# Patient Record
Sex: Male | Born: 1955 | Race: Black or African American | Hispanic: No | Marital: Single | Smoking: Current every day smoker
Health system: Southern US, Community
[De-identification: ages and names within clinical notes are randomized; demographics above are authoritative.]

## PROBLEM LIST (undated history)

## (undated) DIAGNOSIS — F1910 Other psychoactive substance abuse, uncomplicated: Secondary | ICD-10-CM

## (undated) DIAGNOSIS — F32A Depression, unspecified: Secondary | ICD-10-CM

## (undated) DIAGNOSIS — B1920 Unspecified viral hepatitis C without hepatic coma: Secondary | ICD-10-CM

## (undated) DIAGNOSIS — F319 Bipolar disorder, unspecified: Secondary | ICD-10-CM

## (undated) DIAGNOSIS — H5440 Blindness, one eye, unspecified eye: Secondary | ICD-10-CM

## (undated) DIAGNOSIS — K7460 Unspecified cirrhosis of liver: Secondary | ICD-10-CM

## (undated) DIAGNOSIS — F329 Major depressive disorder, single episode, unspecified: Secondary | ICD-10-CM

## (undated) DIAGNOSIS — D696 Thrombocytopenia, unspecified: Secondary | ICD-10-CM

## (undated) HISTORY — PX: EYE SURGERY: SHX253

---

## 2004-10-31 ENCOUNTER — Emergency Department (HOSPITAL_COMMUNITY): Admission: EM | Admit: 2004-10-31 | Discharge: 2004-11-01 | Payer: Self-pay | Admitting: Emergency Medicine

## 2004-11-07 ENCOUNTER — Ambulatory Visit: Payer: Self-pay | Admitting: *Deleted

## 2005-04-21 ENCOUNTER — Emergency Department (HOSPITAL_COMMUNITY): Admission: EM | Admit: 2005-04-21 | Discharge: 2005-04-21 | Payer: Self-pay | Admitting: Emergency Medicine

## 2005-04-22 ENCOUNTER — Emergency Department (HOSPITAL_COMMUNITY): Admission: EM | Admit: 2005-04-22 | Discharge: 2005-04-22 | Payer: Self-pay | Admitting: Emergency Medicine

## 2008-03-11 ENCOUNTER — Emergency Department (HOSPITAL_COMMUNITY): Admission: EM | Admit: 2008-03-11 | Discharge: 2008-03-11 | Payer: Self-pay | Admitting: Emergency Medicine

## 2009-12-17 ENCOUNTER — Emergency Department (HOSPITAL_COMMUNITY)
Admission: EM | Admit: 2009-12-17 | Discharge: 2009-12-17 | Payer: Self-pay | Source: Home / Self Care | Admitting: Emergency Medicine

## 2009-12-24 ENCOUNTER — Emergency Department (HOSPITAL_COMMUNITY): Admission: EM | Admit: 2009-12-24 | Discharge: 2009-12-25 | Payer: Self-pay | Admitting: Emergency Medicine

## 2010-04-11 LAB — POCT I-STAT, CHEM 8
BUN: 12 mg/dL (ref 6–23)
Calcium, Ion: 0.98 mmol/L — ABNORMAL LOW (ref 1.12–1.32)
Chloride: 103 meq/L (ref 96–112)
Creatinine, Ser: 1.2 mg/dL (ref 0.4–1.5)
Glucose, Bld: 105 mg/dL — ABNORMAL HIGH (ref 70–99)
HCT: 39 % (ref 39.0–52.0)
Hemoglobin: 13.3 g/dL (ref 13.0–17.0)
Potassium: 3.8 meq/L (ref 3.5–5.1)
Sodium: 141 meq/L (ref 135–145)
TCO2: 28 mmol/L (ref 0–100)

## 2010-04-11 LAB — DIFFERENTIAL
Basophils Absolute: 0 K/uL (ref 0.0–0.1)
Basophils Relative: 1 % (ref 0–1)
Basophils Relative: 1 % (ref 0–1)
Eosinophils Absolute: 0 10*3/uL (ref 0.0–0.7)
Eosinophils Absolute: 0 K/uL (ref 0.0–0.7)
Eosinophils Relative: 0 % (ref 0–5)
Lymphocytes Relative: 63 % — ABNORMAL HIGH (ref 12–46)
Lymphs Abs: 1.3 10*3/uL (ref 0.7–4.0)
Lymphs Abs: 2.8 K/uL (ref 0.7–4.0)
Monocytes Absolute: 0.3 K/uL (ref 0.1–1.0)
Monocytes Relative: 5 % (ref 3–12)
Monocytes Relative: 6 % (ref 3–12)
Neutro Abs: 1.4 K/uL — ABNORMAL LOW (ref 1.7–7.7)
Neutro Abs: 2.4 10*3/uL (ref 1.7–7.7)
Neutrophils Relative %: 30 % — ABNORMAL LOW (ref 43–77)
Neutrophils Relative %: 61 % (ref 43–77)

## 2010-04-11 LAB — CBC
HCT: 34.5 % — ABNORMAL LOW (ref 39.0–52.0)
HCT: 39.5 % (ref 39.0–52.0)
Hemoglobin: 11.5 g/dL — ABNORMAL LOW (ref 13.0–17.0)
Hemoglobin: 13.8 g/dL (ref 13.0–17.0)
MCH: 30.2 pg (ref 26.0–34.0)
MCH: 31.5 pg (ref 26.0–34.0)
MCHC: 33.3 g/dL (ref 30.0–36.0)
MCHC: 34.8 g/dL (ref 30.0–36.0)
MCV: 90.5 fL (ref 78.0–100.0)
MCV: 90.6 fL (ref 78.0–100.0)
Platelets: 65 K/uL — ABNORMAL LOW (ref 150–400)
Platelets: 73 K/uL — ABNORMAL LOW (ref 150–400)
RBC: 3.81 MIL/uL — ABNORMAL LOW (ref 4.22–5.81)
RBC: 4.37 MIL/uL (ref 4.22–5.81)
RDW: 13.4 % (ref 11.5–15.5)
RDW: 13.5 % (ref 11.5–15.5)
WBC: 3.9 K/uL — ABNORMAL LOW (ref 4.0–10.5)
WBC: 4.5 K/uL (ref 4.0–10.5)

## 2010-04-11 LAB — CK TOTAL AND CKMB (NOT AT ARMC)
CK, MB: 3.9 ng/mL (ref 0.3–4.0)
Total CK: 278 U/L — ABNORMAL HIGH (ref 7–232)

## 2010-04-11 LAB — URINALYSIS, ROUTINE W REFLEX MICROSCOPIC
Bilirubin Urine: NEGATIVE
Glucose, UA: NEGATIVE mg/dL
Hgb urine dipstick: NEGATIVE
Ketones, ur: NEGATIVE mg/dL
Nitrite: NEGATIVE
Nitrite: NEGATIVE
Protein, ur: NEGATIVE mg/dL
Specific Gravity, Urine: 1.015 (ref 1.005–1.030)
Specific Gravity, Urine: 1.02 (ref 1.005–1.030)
Urobilinogen, UA: 1 mg/dL (ref 0.0–1.0)
Urobilinogen, UA: 1 mg/dL (ref 0.0–1.0)
pH: 5 (ref 5.0–8.0)
pH: 5.5 (ref 5.0–8.0)

## 2010-04-11 LAB — COMPREHENSIVE METABOLIC PANEL WITH GFR
ALT: 125 U/L — ABNORMAL HIGH (ref 0–53)
AST: 222 U/L — ABNORMAL HIGH (ref 0–37)
Albumin: 3.4 g/dL — ABNORMAL LOW (ref 3.5–5.2)
Alkaline Phosphatase: 118 U/L — ABNORMAL HIGH (ref 39–117)
BUN: 15 mg/dL (ref 6–23)
CO2: 27 meq/L (ref 19–32)
Calcium: 8.6 mg/dL (ref 8.4–10.5)
Chloride: 104 meq/L (ref 96–112)
Creatinine, Ser: 0.95 mg/dL (ref 0.4–1.5)
GFR calc Af Amer: >60 mL/min (ref >60)
GFR calc non Af Amer: >60 mL/min (ref >60)
Glucose, Bld: 112 mg/dL — ABNORMAL HIGH (ref 70–99)
Potassium: 3.9 meq/L (ref 3.5–5.1)
Sodium: 143 meq/L (ref 135–145)
Total Bilirubin: 1.2 mg/dL (ref 0.3–1.2)
Total Protein: 7.4 g/dL (ref 6.0–8.3)

## 2010-04-11 LAB — ETHANOL: Alcohol, Ethyl (B): 317 mg/dL — ABNORMAL HIGH (ref 0–10)

## 2010-04-11 LAB — TROPONIN I: Troponin I: 0.03 ng/mL (ref 0.00–0.06)

## 2012-08-06 ENCOUNTER — Encounter (HOSPITAL_COMMUNITY): Payer: Self-pay | Admitting: Emergency Medicine

## 2012-08-06 ENCOUNTER — Emergency Department (HOSPITAL_COMMUNITY)
Admission: EM | Admit: 2012-08-06 | Discharge: 2012-08-06 | Disposition: A | Discharge status: Home / Self Care | Payer: Self-pay | Attending: Emergency Medicine | Admitting: Emergency Medicine

## 2012-08-06 DIAGNOSIS — F17200 Nicotine dependence, unspecified, uncomplicated: Secondary | ICD-10-CM | POA: Insufficient documentation

## 2012-08-06 DIAGNOSIS — L259 Unspecified contact dermatitis, unspecified cause: Secondary | ICD-10-CM | POA: Insufficient documentation

## 2012-08-06 DIAGNOSIS — Z8669 Personal history of other diseases of the nervous system and sense organs: Secondary | ICD-10-CM | POA: Insufficient documentation

## 2012-08-06 HISTORY — DX: Blindness, one eye, unspecified eye: H54.40

## 2012-08-06 MED ORDER — PREDNISONE 20 MG PO TABS
60.0000 mg | ORAL_TABLET | Freq: Once | ORAL | Status: AC
  Administered 2012-08-06: 60 mg via ORAL
  Filled 2012-08-06: qty 3

## 2012-08-06 MED ORDER — PREDNISONE 10 MG PO TABS: ORAL_TABLET | ORAL | Status: DC

## 2012-08-06 MED ORDER — DIPHENHYDRAMINE HCL 25 MG PO CAPS: 25.0000 mg | ORAL_CAPSULE | Freq: Four times a day (QID) | ORAL | Status: DC | PRN

## 2012-08-06 MED ORDER — HYDROCORTISONE 1 % EX CREA: TOPICAL_CREAM | CUTANEOUS | Status: DC

## 2012-08-06 MED ORDER — DIPHENHYDRAMINE HCL 25 MG PO CAPS
25.0000 mg | ORAL_CAPSULE | Freq: Once | ORAL | Status: AC
  Administered 2012-08-06: 25 mg via ORAL
  Filled 2012-08-06: qty 1

## 2012-08-06 NOTE — ED Notes (Signed)
Started 3-4 days ago, woke up with itching bumps. Now has spread to groin, buttocks, and legs.

## 2012-08-06 NOTE — ED Provider Notes (Signed)
   History    CSN: [id number] Arrival date & time 08/06/12  1032  First MD Initiated Contact with Patient 08/06/12 1038     Chief Complaint  Patient presents with  . Rash   (Consider location/radiation/quality/duration/timing/severity/associated sxs/prior Treatment) HPI Albert Salazar is a 57 y.o. male who presents to ED with complaint of a rash. States noticed mild rash 5 days ago. States since then worsening. Reports red bumps on legs, thighs, arms. States very itchy, non tender, red. Has not tried any treatments for this. No new soap, detergent, personal products. Has not traveled or slept anywhere other than his house. States lives with a girlfriend who does not have a rash. States has done yard work in the last week.    Past Medical History  Diagnosis Date  . Blindness of left eye    Past Surgical History  Procedure Laterality Date  . Eye surgery     No family history on file. History  Substance Use Topics  . Smoking status: Current Every Day Smoker  . Smokeless tobacco: Not on file  . Alcohol Use: Yes    Review of Systems  Constitutional: Negative for fever and chills.  HENT: Negative for neck pain and neck stiffness.   Eyes: Negative for photophobia.  Skin: Positive for rash.  Neurological: Negative for headaches.    Allergies  Review of patient's allergies indicates no known allergies.  Home Medications  No current outpatient prescriptions on file. BP 154/95  Pulse 80  Temp(Src) 98.2 F (36.8 C) (Oral)  SpO2 99% Physical Exam  Nursing note and vitals reviewed. Constitutional: He appears well-developed and well-nourished. No distress.  HENT:  Head: Normocephalic and atraumatic.  Mouth/Throat: Oropharynx is clear and moist.  No mucosal rash or lesions  Neck: Neck supple.  Cardiovascular: Normal rate, regular rhythm and normal heart sounds.   Pulmonary/Chest: Effort normal. No respiratory distress. He has no wheezes. He has no rales.  Musculoskeletal: He  exhibits no edema.  Neurological: He is alert.  Skin: Skin is warm and dry.  Erythematous papules over lower legs, bilateral inner thighs, bilateral forearms. No vesicles. Non tender.     ED Course  Procedures (including critical care time) Labs Reviewed - No data to display No results found.  1. Contact dermatitis     MDM  Pt with erythematous rash to the bilateral legs, thighs, arms. Rash is consistent with contact dermatitis, possibly form his yard work. No oral mucosal involvement. No fever.  Pt otherwise non toxic. Will start on short course of prednisone, benadryl  PO. Follow up as needed.   Filed Vitals:   08/06/12 1044  BP: 154/95  Pulse: 80  Temp: 98.2 F (36.8 C)  TempSrc: Oral  SpO2: 99%     Sloane Junkin A Taryn Nave, PA-C 08/06/12 1102

## 2012-08-07 NOTE — ED Provider Notes (Signed)
Medical screening examination/treatment/procedure(s) were performed by non-physician practitioner and as supervising physician I was immediately available for consultation/collaboration.   Carleene Cooper III, MD 08/07/12 [phone]

## 2012-12-08 ENCOUNTER — Encounter (HOSPITAL_COMMUNITY): Payer: Self-pay | Admitting: Emergency Medicine

## 2012-12-08 ENCOUNTER — Emergency Department (HOSPITAL_COMMUNITY)
Admission: EM | Admit: 2012-12-08 | Discharge: 2012-12-08 | Disposition: A | Discharge status: Home / Self Care | Payer: Medicaid Other | Attending: Emergency Medicine | Admitting: Emergency Medicine

## 2012-12-08 DIAGNOSIS — R300 Dysuria: Secondary | ICD-10-CM | POA: Insufficient documentation

## 2012-12-08 DIAGNOSIS — IMO0002 Reserved for concepts with insufficient information to code with codable children: Secondary | ICD-10-CM | POA: Insufficient documentation

## 2012-12-08 DIAGNOSIS — H5440 Blindness, one eye, unspecified eye: Secondary | ICD-10-CM | POA: Insufficient documentation

## 2012-12-08 DIAGNOSIS — F17200 Nicotine dependence, unspecified, uncomplicated: Secondary | ICD-10-CM | POA: Insufficient documentation

## 2012-12-08 LAB — URINALYSIS, ROUTINE W REFLEX MICROSCOPIC
Leukocytes, UA: NEGATIVE
Nitrite: NEGATIVE
Protein, ur: NEGATIVE mg/dL
Specific Gravity, Urine: 1.019 (ref 1.005–1.030)
Urobilinogen, UA: 1 mg/dL (ref 0.0–1.0)

## 2012-12-08 LAB — POCT I-STAT, CHEM 8
Calcium, Ion: 1.22 mmol/L (ref 1.12–1.23)
Creatinine, Ser: 0.9 mg/dL (ref 0.50–1.35)
Glucose, Bld: 107 mg/dL — ABNORMAL HIGH (ref 70–99)
Hemoglobin: 15 g/dL (ref 13.0–17.0)
TCO2: 26 mmol/L (ref 0–100)

## 2012-12-08 MED ORDER — AZITHROMYCIN 250 MG PO TABS
1000.0000 mg | ORAL_TABLET | Freq: Once | ORAL | Status: AC
  Administered 2012-12-08: 1000 mg via ORAL
  Filled 2012-12-08: qty 4

## 2012-12-08 MED ORDER — CEFTRIAXONE SODIUM 250 MG IJ SOLR
250.0000 mg | Freq: Once | INTRAMUSCULAR | Status: AC
  Administered 2012-12-08: 250 mg via INTRAMUSCULAR
  Filled 2012-12-08: qty 250

## 2012-12-08 MED ORDER — LIDOCAINE HCL (PF) 1 % IJ SOLN
INTRAMUSCULAR | Status: AC
  Administered 2012-12-08: 2.1 mL
  Filled 2012-12-08: qty 5

## 2012-12-08 NOTE — ED Notes (Signed)
Pt c/o dysuria and urgency with some difficulty getting stream started; pt sts possible STD exposure

## 2012-12-08 NOTE — ED Provider Notes (Signed)
CSN: [id number]     Arrival date & time 12/08/12  1049 History   First MD Initiated Contact with Patient 12/08/12 1110     Chief Complaint  Patient presents with  . Dysuria   (Consider location/radiation/quality/duration/timing/severity/associated sxs/prior Treatment) HPI Complaint of dysuria. And feeling of l post for residual for the past 6 months. He he denies any possible exposure to STD though he states he feels like a "venereal disease" with burning in his penis upon urination. He denies discharge from his penis denies fever denies nausea denies vomiting. No other complaint. No treatment prior to coming here. No fever. No other associated symptoms symptoms are worse with urinating not improved by anything. Past Medical History  Diagnosis Date  . Blindness of left eye    Past Surgical History  Procedure Laterality Date  . Eye surgery     History reviewed. No pertinent family history. History  Substance Use Topics  . Smoking status: Current Every Day Smoker  . Smokeless tobacco: Not on file  . Alcohol Use: Yes    Review of Systems  Constitutional: Negative.   HENT: Negative.   Eyes:       Blind in left eye from injury many years ago  Respiratory: Negative.   Cardiovascular: Negative.   Gastrointestinal: Negative.   Genitourinary: Positive for dysuria.       Feeling of post void residual , difficult getting full erection, difficulty eejaculating  Musculoskeletal: Negative.   Skin: Negative.   Neurological: Negative.   Psychiatric/Behavioral: Negative.   All other systems reviewed and are negative.    Allergies  Review of patient's allergies indicates no known allergies.  Home Medications   Current Outpatient Rx  Name  Route  Sig  Dispense  Refill  . diphenhydrAMINE (BENADRYL) 25 mg capsule   Oral   Take 1 capsule (25 mg total) by mouth every 6 (six) hours as needed for itching.   30 capsule   0   . hydrocortisone cream 1 %      Apply to affected area 2  times daily   15 g   0   . predniSONE (DELTASONE) 10 MG tablet      Take 5 tab day 1, take 4 tab day 2, take 3 tab day 3, take 2 tab day 4, and take 1 tab day 5   15 tablet   0    BP 133/78  Pulse 85  Temp(Src) 98.6 F (37 C) (Oral)  Resp 18  Ht 5\' 9"  (1.753 m)  Wt 151 lb (68.493 kg)  BMI 22.29 kg/m2  SpO2 99% Physical Exam  Nursing note and vitals reviewed. Constitutional: He appears well-developed and well-nourished.  HENT:  Head: Normocephalic and atraumatic.  Eyes: Conjunctivae and EOM are normal.  Left cornea opacified right cornea normal  Neck: Neck supple. No tracheal deviation present. No thyromegaly present.  Cardiovascular: Normal rate and regular rhythm.   No murmur heard. Pulmonary/Chest: Effort normal and breath sounds normal.  Abdominal: Soft. Bowel sounds are normal. He exhibits no distension. There is no tenderness.  Genitourinary: Rectum normal, prostate normal and penis normal.  Uncircumcised  Musculoskeletal: Normal range of motion. He exhibits no edema and no tenderness.  Neurological: He is alert. Coordination normal.  Skin: Skin is warm and dry. No rash noted.  Psychiatric: He has a normal mood and affect.    ED Course  Procedures (including critical care time) Labs Review Labs Reviewed  URINALYSIS, ROUTINE W REFLEX MICROSCOPIC   Imaging  Review No results found.  EKG Interpretation   None      post void residual 23 mL, normal Results for orders placed during the hospital encounter of 12/08/12  URINALYSIS, ROUTINE W REFLEX MICROSCOPIC      Result Value Range   Color, Urine YELLOW  YELLOW   APPearance CLEAR  CLEAR   Specific Gravity, Urine 1.019  1.005 - 1.030   pH 6.5  5.0 - 8.0   Glucose, UA NEGATIVE  NEGATIVE mg/dL   Hgb urine dipstick NEGATIVE  NEGATIVE   Bilirubin Urine NEGATIVE  NEGATIVE   Ketones, ur NEGATIVE  NEGATIVE mg/dL   Protein, ur NEGATIVE  NEGATIVE mg/dL   Urobilinogen, UA 1.0  0.0 - 1.0 mg/dL   Nitrite NEGATIVE   NEGATIVE   Leukocytes, UA NEGATIVE  NEGATIVE  POCT I-STAT, CHEM 8      Result Value Range   Sodium 145  135 - 145 mEq/L   Potassium 3.8  3.5 - 5.1 mEq/L   Chloride 105  96 - 112 mEq/L   BUN 8  6 - 23 mg/dL   Creatinine, Ser 4.09  0.50 - 1.35 mg/dL   Glucose, Bld 811 (*) 70 - 99 mg/dL   Calcium, Ion 9.14  7.82 - 1.23 mmol/L   TCO2 26  0 - 100 mmol/L   Hemoglobin 15.0  13.0 - 17.0 g/dL   HCT 95.6  21.3 - 08.6 %   No results found.  MDM  No diagnosis found. In light of dysuria we'll treat for urethritis. Rocephin Zithromax ordered Plan urology referral. [facility] Diagnosis #1 dysuria #2 erectile dysfunction    Doug Sou, MD 12/08/12 1410

## 2012-12-08 NOTE — ED Notes (Signed)
Bladder scan done.Residual urine 23ml.

## 2012-12-08 NOTE — ED Notes (Signed)
Discharge instructions reviewed. Pt verbalized understanding.  

## 2012-12-09 LAB — GC/CHLAMYDIA PROBE AMP
CT Probe RNA: NEGATIVE
GC Probe RNA: NEGATIVE

## 2013-01-20 ENCOUNTER — Ambulatory Visit: Payer: Self-pay

## 2013-07-15 ENCOUNTER — Encounter (HOSPITAL_COMMUNITY): Payer: Self-pay | Admitting: Emergency Medicine

## 2013-07-15 ENCOUNTER — Emergency Department (HOSPITAL_COMMUNITY)
Admission: EM | Admit: 2013-07-15 | Discharge: 2013-07-15 | Discharge status: Against Medical Advice | Payer: Medicaid Other | Source: Home / Self Care

## 2013-07-15 ENCOUNTER — Emergency Department (HOSPITAL_COMMUNITY)
Admission: EM | Admit: 2013-07-15 | Discharge: 2013-07-15 | Disposition: A | Discharge status: Home / Self Care | Payer: Medicaid Other | Attending: Emergency Medicine | Admitting: Emergency Medicine

## 2013-07-15 DIAGNOSIS — R21 Rash and other nonspecific skin eruption: Secondary | ICD-10-CM

## 2013-07-15 DIAGNOSIS — Z8669 Personal history of other diseases of the nervous system and sense organs: Secondary | ICD-10-CM | POA: Insufficient documentation

## 2013-07-15 DIAGNOSIS — F17200 Nicotine dependence, unspecified, uncomplicated: Secondary | ICD-10-CM | POA: Insufficient documentation

## 2013-07-15 DIAGNOSIS — B86 Scabies: Secondary | ICD-10-CM | POA: Diagnosis not present

## 2013-07-15 MED ORDER — PERMETHRIN 5 % EX CREA
TOPICAL_CREAM | CUTANEOUS | Status: DC
Start: 2013-07-15 — End: 2013-12-30

## 2013-07-15 MED ORDER — IVERMECTIN 3 MG PO TABS: 200.0000 ug/kg | ORAL_TABLET | Freq: Once | ORAL | Status: DC

## 2013-07-15 NOTE — ED Notes (Signed)
PA at bedside.

## 2013-07-15 NOTE — Discharge Instructions (Signed)
Please follow up with a primary care provider for continued evaluation and treatment.     Scabies Scabies are small bugs (mites) that burrow under the skin and cause red bumps and severe itching. These bugs can only be seen with a microscope. Scabies are highly contagious. They can spread easily from person to person by direct contact. They are also spread through sharing clothing or linens that have the scabies mites living in them. It is not unusual for an entire family to become infected through shared towels, clothing, or bedding.  HOME CARE INSTRUCTIONS   Your caregiver may prescribe a cream or lotion to kill the mites. If cream is prescribed, massage the cream into the entire body from the neck to the bottom of both feet. Also massage the cream into the scalp and face if your child is less than 56 year old. Avoid the eyes and mouth. Do not wash your hands after application.  Leave the cream on for 8 to 12 hours. Your child should bathe or shower after the 8 to 12 hour application period. Sometimes it is helpful to apply the cream to your child right before bedtime.  One treatment is usually effective and will eliminate approximately 95% of infestations. For severe cases, your caregiver may decide to repeat the treatment in 1 week. Everyone in your household should be treated with one application of the cream.  New rashes or burrows should not appear within 24 to 48 hours after successful treatment. However, the itching and rash may last for 2 to 4 weeks after successful treatment. Your caregiver may prescribe a medicine to help with the itching or to help the rash go away more quickly.  Scabies can live on clothing or linens for up to 3 days. All of your child's recently used clothing, towels, stuffed toys, and bed linens should be washed in hot water and then dried in a dryer for at least 20 minutes on high heat. Items that cannot be washed should be enclosed in a plastic bag for at least 3  days.  To help relieve itching, bathe your child in a cool bath or apply cool washcloths to the affected areas.  Your child may return to school after treatment with the prescribed cream. SEEK MEDICAL CARE IF:   The itching persists longer than 4 weeks after treatment.  The rash spreads or becomes infected. Signs of infection include red blisters or yellow-tan crust. Document Released: 01/15/2005 Document Revised: 04/09/2011 Document Reviewed: 05/26/2008 [facility] Patient Information 2014 [place], [place].

## 2013-07-15 NOTE — ED Notes (Signed)
Pt c/o pruritic rash or ? Bites to waist line and legs

## 2013-07-15 NOTE — ED Notes (Signed)
Bed: WTR8 Expected date:  Expected time:  Means of arrival:  Comments: EMS rash

## 2013-07-15 NOTE — ED Notes (Signed)
Patient c/o rash to legs, arms, waist, and back. Small red scattered rash, likes like small bug bite type marks.

## 2013-07-15 NOTE — ED Notes (Signed)
Patient left AMA. Stated he has waited to long and "it's clear no one is going to take care of me." Patient refused to signed AMA.

## 2013-07-15 NOTE — ED Notes (Signed)
Pt is cursing and unable to get much information

## 2013-07-15 NOTE — ED Provider Notes (Signed)
Medical screening examination/treatment/procedure(s) were performed by non-physician practitioner and as supervising physician I was immediately available for consultation/collaboration.   EKG Interpretation None       Loren Raceravid Yelverton, MD 07/15/13 [phone]

## 2013-07-15 NOTE — ED Notes (Signed)
Per EMS, patient c/o rash to bilateral arms, legs, and lower back. Patient reports to EMS rash has been in place for approx 1-1/2 months

## 2013-07-15 NOTE — ED Provider Notes (Signed)
CSN: [id number]     Arrival date & time 07/15/13  0112 History   First MD Initiated Contact with Patient 07/15/13 0149     Chief Complaint  Patient presents with  . Rash   HPI  History provided by the patient. Patient's-year-old male presenting with complaints of persistent pruritic rash to the skin. Patient reports having a rash and itching for the past 2 months or more. He is not using treatments for his symptoms. He is primarily itching to his arms, waistline and lower legs. He denies any known allergies. No changes in soaps, lotions or shampoos. No other family members have similar rash. Denies any associated fever, chills or sweats. No pets in the house.    Past Medical History  Diagnosis Date  . Blindness of left eye    Past Surgical History  Procedure Laterality Date  . Eye surgery     No family history on file. History  Substance Use Topics  . Smoking status: Current Every Day Smoker  . Smokeless tobacco: Not on file  . Alcohol Use: Yes    Review of Systems  Constitutional: Negative for fever, chills and diaphoresis.  Respiratory: Negative for shortness of breath.   Cardiovascular: Negative for chest pain.  Skin: Positive for rash.  All other systems reviewed and are negative.     Allergies  Review of patient's allergies indicates no known allergies.  Home Medications   Prior to Admission medications   Not on File   temperature 98.4, pulse 106, respirations 20, blood pressure 125/81, O2 95% Physical Exam  Nursing note and vitals reviewed. Constitutional: He is oriented to person, place, and time. He appears well-developed and well-nourished. No distress.  HENT:  Head: Normocephalic.  Neck: Normal range of motion. Neck supple.  Cardiovascular: Normal rate and regular rhythm.   Pulmonary/Chest: Effort normal and breath sounds normal. No stridor. No respiratory distress. He has no wheezes.  Musculoskeletal: Normal range of motion.  Neurological: He is  alert and oriented to person, place, and time.  Skin: Skin is warm. Rash noted.  Papular rash bilateral forearms and around the waistline with multiple excoriations.  Psychiatric: He has a normal mood and affect. His behavior is normal.    ED Course  Procedures   COORDINATION OF CARE:  Nursing notes reviewed. Vital signs reviewed. Initial pt interview and examination performed.     2:03 AM-patient seen and evaluated. Patient returned after initially being in the emergency room 30 minutes and leaving AMA prior to evaluation. He returns now with same complaints of rash. Has had chronic rash for the last several months. Rash and irritation consistent with possible scabies. We'll give dose of ivermectin prescription for permethrin cream. Patient advised to wash all of clothing and linens at home in hot water   Treatment plan initiated: Medications  ivermectin (STROMECTOL) tablet 200 mcg/kg (not administered)      MDM   Final diagnoses:  Scabies        Angus Sellereter S Dammen, PA-C 07/15/13 0205

## 2013-12-29 ENCOUNTER — Encounter (HOSPITAL_COMMUNITY): Payer: Self-pay | Admitting: *Deleted

## 2013-12-29 ENCOUNTER — Emergency Department (HOSPITAL_COMMUNITY)
Admission: EM | Admit: 2013-12-29 | Discharge: 2013-12-30 | Disposition: A | Discharge status: Home / Self Care | Payer: Medicaid Other | Attending: Emergency Medicine | Admitting: Emergency Medicine

## 2013-12-29 DIAGNOSIS — F1410 Cocaine abuse, uncomplicated: Secondary | ICD-10-CM | POA: Diagnosis present

## 2013-12-29 DIAGNOSIS — F1414 Cocaine abuse with cocaine-induced mood disorder: Secondary | ICD-10-CM | POA: Insufficient documentation

## 2013-12-29 DIAGNOSIS — R45851 Suicidal ideations: Secondary | ICD-10-CM | POA: Diagnosis present

## 2013-12-29 DIAGNOSIS — Z8619 Personal history of other infectious and parasitic diseases: Secondary | ICD-10-CM | POA: Diagnosis not present

## 2013-12-29 DIAGNOSIS — F329 Major depressive disorder, single episode, unspecified: Secondary | ICD-10-CM | POA: Insufficient documentation

## 2013-12-29 DIAGNOSIS — H5442 Blindness, left eye, normal vision right eye: Secondary | ICD-10-CM | POA: Diagnosis not present

## 2013-12-29 DIAGNOSIS — F1994 Other psychoactive substance use, unspecified with psychoactive substance-induced mood disorder: Secondary | ICD-10-CM | POA: Diagnosis present

## 2013-12-29 DIAGNOSIS — H259 Unspecified age-related cataract: Secondary | ICD-10-CM | POA: Insufficient documentation

## 2013-12-29 DIAGNOSIS — Z720 Tobacco use: Secondary | ICD-10-CM | POA: Insufficient documentation

## 2013-12-29 DIAGNOSIS — F1020 Alcohol dependence, uncomplicated: Secondary | ICD-10-CM | POA: Diagnosis present

## 2013-12-29 HISTORY — DX: Unspecified viral hepatitis C without hepatic coma: B19.20

## 2013-12-29 LAB — CBC
HCT: 37 % — ABNORMAL LOW (ref 39.0–52.0)
Hemoglobin: 12.2 g/dL — ABNORMAL LOW (ref 13.0–17.0)
MCH: 32.6 pg (ref 26.0–34.0)
MCHC: 33 g/dL (ref 30.0–36.0)
MCV: 98.9 fL (ref 78.0–100.0)
PLATELETS: DECREASED 10*3/uL (ref 150–400)
RBC: 3.74 MIL/uL — ABNORMAL LOW (ref 4.22–5.81)
RDW: 14.1 % (ref 11.5–15.5)
WBC: 6.4 10*3/uL (ref 4.0–10.5)

## 2013-12-29 LAB — COMPREHENSIVE METABOLIC PANEL
ALBUMIN: 2.9 g/dL — AB (ref 3.5–5.2)
ALT: 58 U/L — ABNORMAL HIGH (ref 0–53)
AST: 90 U/L — AB (ref 0–37)
Alkaline Phosphatase: 204 U/L — ABNORMAL HIGH (ref 39–117)
Anion gap: 12 (ref 5–15)
BUN: 10 mg/dL (ref 6–23)
CALCIUM: 8.6 mg/dL (ref 8.4–10.5)
CO2: 24 mEq/L (ref 19–32)
Chloride: 105 mEq/L (ref 96–112)
Creatinine, Ser: 0.91 mg/dL (ref 0.50–1.35)
GFR calc Af Amer: >90 mL/min (ref >90)
GFR calc non Af Amer: >90 mL/min (ref >90)
Glucose, Bld: 147 mg/dL — ABNORMAL HIGH (ref 70–99)
POTASSIUM: 4.2 meq/L (ref 3.7–5.3)
Sodium: 141 mEq/L (ref 137–147)
Total Bilirubin: 0.9 mg/dL (ref 0.3–1.2)
Total Protein: 8.8 g/dL — ABNORMAL HIGH (ref 6.0–8.3)

## 2013-12-29 LAB — RAPID URINE DRUG SCREEN, HOSP PERFORMED
Amphetamines: NOT DETECTED
Barbiturates: NOT DETECTED
Benzodiazepines: NOT DETECTED
COCAINE: POSITIVE — AB
OPIATES: NOT DETECTED
Tetrahydrocannabinol: NOT DETECTED

## 2013-12-29 LAB — SALICYLATE LEVEL

## 2013-12-29 LAB — ETHANOL: Alcohol, Ethyl (B): 284 mg/dL — ABNORMAL HIGH (ref 0–11)

## 2013-12-29 LAB — URINALYSIS, ROUTINE W REFLEX MICROSCOPIC
BILIRUBIN URINE: NEGATIVE
Glucose, UA: NEGATIVE mg/dL
HGB URINE DIPSTICK: NEGATIVE
Ketones, ur: NEGATIVE mg/dL
Leukocytes, UA: NEGATIVE
Nitrite: NEGATIVE
PROTEIN: NEGATIVE mg/dL
Specific Gravity, Urine: 1.011 (ref 1.005–1.030)
Urobilinogen, UA: 0.2 mg/dL (ref 0.0–1.0)
pH: 5.5 (ref 5.0–8.0)

## 2013-12-29 LAB — ACETAMINOPHEN LEVEL: Acetaminophen (Tylenol), Serum: <15 ug/mL (ref 10–30)

## 2013-12-29 MED ORDER — ACETAMINOPHEN 325 MG PO TABS: 650.0000 mg | ORAL_TABLET | ORAL | Status: DC | PRN

## 2013-12-29 MED ORDER — THIAMINE HCL 100 MG/ML IJ SOLN: 100.0000 mg | Freq: Every day | INTRAMUSCULAR | Status: DC

## 2013-12-29 MED ORDER — LORAZEPAM 1 MG PO TABS
0.0000 mg | ORAL_TABLET | Freq: Two times a day (BID) | ORAL | Status: DC
Start: 2013-12-31 — End: 2013-12-30

## 2013-12-29 MED ORDER — NAPHAZOLINE HCL 0.1 % OP SOLN
1.0000 [drp] | Freq: Three times a day (TID) | OPHTHALMIC | Status: DC | PRN
  Filled 2013-12-29: qty 15

## 2013-12-29 MED ORDER — VITAMIN B-1 100 MG PO TABS
100.0000 mg | ORAL_TABLET | Freq: Every day | ORAL | Status: DC
  Administered 2013-12-30: 100 mg via ORAL
  Filled 2013-12-29: qty 1

## 2013-12-29 MED ORDER — TETRAHYDROZOLINE HCL 0.05 % OP SOLN: 1.0000 [drp] | Freq: Three times a day (TID) | OPHTHALMIC | Status: DC | PRN

## 2013-12-29 MED ORDER — LORAZEPAM 1 MG PO TABS
0.0000 mg | ORAL_TABLET | Freq: Four times a day (QID) | ORAL | Status: DC
  Administered 2013-12-30: 1 mg via ORAL
  Filled 2013-12-29: qty 1

## 2013-12-29 NOTE — ED Notes (Addendum)
Pt brought in my [employer], IVC, papers state that "pt has been suffering from AH/VH. States that certain family memebers who committed suicide are telling him to come home. Pt says he plans to shoot himself. Admits to having guns in the home. Hx of Hep C and is severe alcoholic, and possible UTI. Pt also seems to have some racial delusions. Respondent thinks other races are out to get him. He also uses profanity and racial slurs towards others. Pt is at his girl friend's house where he is not suppose to be. He has a pending court date for threatening and also breaking and entering. Pt can be aggressive appears to be a danger to himself and others"  Upon assessment pt reports SI/ depression x1 year, reports thoughts have gotten worse in last few days, AH/VH of family members who committed suicide. ETOH abuse x30 years, 6-12 drinks/day. Last use today. Pt does not want ETOH detox at this time. Pt denies pain.

## 2013-12-29 NOTE — ED Notes (Signed)
Patient was wanded by security. Patient has one bag of belongings. Contents are patients clothes and wallet and bedside.

## 2013-12-29 NOTE — ED Provider Notes (Signed)
CSN: [id number]     Arrival date & time 12/29/13  1619 History   First MD Initiated Contact with Patient 12/29/13 1754     Chief Complaint  Patient presents with  . Suicidal   . Hallucinations     HPI Patient was brought into the emergency department under involuntary commitment paperwork. The patient has a restraining order and is not supposed to get his girlfriend's house. He was in her house today and the police were called. Patient has been drinking alcohol.  He admits that he's been hearing voices. He sees family members that her dad telling him to come home. Patient does have some plans to shoot himself. He has been using profanity and aggressive towards others according to his commitment paperwork.  Patient does not mention much of his issues with suicidal ideation or depression to me. Patient is primarily concerned about troubles he is having with erectile dysfunction  he also has noticed difficulties with his urine stream. Past Medical History  Diagnosis Date  . Blindness of left eye   . Hepatitis C    Past Surgical History  Procedure Laterality Date  . Eye surgery     History reviewed. No pertinent family history. History  Substance Use Topics  . Smoking status: Current Every Day Smoker  . Smokeless tobacco: Not on file  . Alcohol Use: Yes    Review of Systems  All other systems reviewed and are negative.     Allergies  Review of patient's allergies indicates no known allergies.  Home Medications   Prior to Admission medications   Medication Sig Start Date End Date Taking? Authorizing Provider  acetaminophen (TYLENOL) 500 MG tablet Take 1,000 mg by mouth every 4 (four) hours as needed for moderate pain or headache.   Yes Historical Provider, MD  carbamide peroxide (DEBROX) 6.5 % otic solution Place 5 drops into both ears every 30 (thirty) days.   Yes Historical Provider, MD  ibuprofen (ADVIL,MOTRIN) 200 MG tablet Take 400 mg by mouth 2 (two) times a week.   Yes  Historical Provider, MD  Tetrahydrozoline HCl (VISINE EXTRA OP) Apply 1-2 drops to eye daily as needed (redness.).   Yes Historical Provider, MD  permethrin (ELIMITE) 5 % cream Apply to affected area once and leave on for 10 hours then wash off. Patient not taking: Reported on 12/29/2013 07/15/13   Phill MutterPeter S Dammen, PA-C   BP 127/64 mmHg  Pulse 82  Temp(Src) 98.4 F (36.9 C) (Oral)  Resp 18  SpO2 98% Physical Exam  Constitutional: He appears well-developed and well-nourished. No distress.  HENT:  Head: Normocephalic and atraumatic.  Right Ear: External ear normal.  Left Ear: External ear normal.  Breath smells of alcohol  Eyes: Conjunctivae are normal. Right eye exhibits no discharge. Left eye exhibits no discharge. No scleral icterus.  Cataract left eye  Neck: Neck supple. No tracheal deviation present.  Cardiovascular: Normal rate, regular rhythm and intact distal pulses.   Pulmonary/Chest: Effort normal and breath sounds normal. No stridor. No respiratory distress. He has no wheezes. He has no rales.  Abdominal: Soft. Bowel sounds are normal. He exhibits no distension. There is no tenderness. There is no rebound and no guarding.  Musculoskeletal: He exhibits no edema or tenderness.  Neurological: He is alert. He has normal strength. No cranial nerve deficit (no facial droop, extraocular movements intact, no slurred speech) or sensory deficit. He exhibits normal muscle tone. He displays no seizure activity. Coordination normal.  Skin: Skin is  warm and dry. No rash noted.  Psychiatric: He is actively hallucinating. He exhibits a depressed mood. He expresses suicidal ideation.  Nursing note and vitals reviewed.   ED Course  Procedures (including critical care time) Labs Review Labs Reviewed  CBC - Abnormal; Notable for the following:    RBC 3.74 (*)    Hemoglobin 12.2 (*)    HCT 37.0 (*)    All other components within normal limits  COMPREHENSIVE METABOLIC PANEL - Abnormal;  Notable for the following:    Glucose, Bld 147 (*)    Total Protein 8.8 (*)    Albumin 2.9 (*)    AST 90 (*)    ALT 58 (*)    Alkaline Phosphatase 204 (*)    All other components within normal limits  ETHANOL - Abnormal; Notable for the following:    Alcohol, Ethyl (B) 284 (*)    All other components within normal limits  SALICYLATE LEVEL - Abnormal; Notable for the following:    Salicylate Lvl <2.0 (*)    All other components within normal limits  URINE RAPID DRUG SCREEN (HOSP PERFORMED) - Abnormal; Notable for the following:    Cocaine POSITIVE (*)    All other components within normal limits  URINALYSIS, ROUTINE W REFLEX MICROSCOPIC - Abnormal; Notable for the following:    APPearance CLOUDY (*)    All other components within normal limits  ACETAMINOPHEN LEVEL     MDM    Pt is medically stable.  He will need inpatient psychiatric treatment.  Linwood DibblesJon Oakley Orban, MD 12/29/13 [phone]

## 2013-12-29 NOTE — ED Notes (Signed)
[id number] date:  Expected time:  Means of arrival:  Comments: [place]

## 2013-12-29 NOTE — ED Notes (Signed)
TTS in with patient.  

## 2013-12-29 NOTE — ED Notes (Signed)
Patient is calm, cooperative. Currently denies SI, HI, AVH. Rates feelings of anxiety and depression at 6/10. States he has poor sleep related to on going bowel and bladder urgency and frequency problems.   Encouragement offered.   Q 15 safety checks continue.

## 2013-12-29 NOTE — BH Assessment (Signed)
Assessment Note  Albert Salazar is an 58 y.o. male.  -Clinician spoke with Albert Salazar regarding need for TTS.  Patient is on IVC taken out by gf indicate that patient has been making threats towards her.  Had said he would kill himself with a gun.  Has been seeing and hearing from dead people from the past.  Albert Salazar said that patient denies current SI & HI.  Patient is very colorful with is language he calls gf a bitch and dumbass and other such descriptors.  Patient lives with gf who is 10 years his senior and has health problems.  He complains about her bothering him and getting on his nerves. Her son lives with them and the house is never quiet he says.  Patient uses racial slurs to describe gf and her son.  Talk with patient consistently returns to him wanting to get away from gf and this stress in his life.  When asked about SI patient admits to having some thoughts.  Patient says "I had the gun loaded but I put it down."  Patient has no current SI.  Patient denies wanting to harm others.  He says he just wants to get away from gf.  Patient does admit to seeing dead people from the past on the street.  He hears voices telling him to "come home."  Patient says that he has had this for years and knows that they are not real.  Patient says his mother passed away a year ago.  He tears up talking about his mother and how she was his best friend.  Patient drinks 6-12 beers daily for the last 10 years ago.  He drank this morning (12/01).  Patient says that he would not drink as much if he were not around gf.  He says he only uses crack if other people have it and offer it to him.  Pt was positive for it on UDS.  Pt not clear about his trespassing charge for which he has to be in court on 12/08.  Patient says that he lives where he was supposedly trespassing.  Patient does not appear to understand this process.  -Pt care discussed with Albert SievertSpencer Simon, PA who recommends that psychiatry see patient in AM  on 12/02 to uphold or rescind IVC.  Patient care discussed with Albert Salazar who agrees with present disposition.  Axis I: Substance Induced Mood Disorder and 303.90 ETOH use d/o severe Axis II: Deferred Axis III:  Past Medical History  Diagnosis Date  . Blindness of left eye   . Hepatitis C    Axis IV: economic problems, housing problems, occupational problems, other psychosocial or environmental problems and problems related to legal system/crime Axis V: 31-40 impairment in reality testing  Past Medical History:  Past Medical History  Diagnosis Date  . Blindness of left eye   . Hepatitis C     Past Surgical History  Procedure Laterality Date  . Eye surgery      Family History: History reviewed. No pertinent family history.  Social History:  reports that he has been smoking.  He does not have any smokeless tobacco history on file. He reports that he drinks alcohol. He reports that he does not use illicit drugs.  Additional Social History:  Alcohol / Drug Use Pain Medications: None Prescriptions: None Over the Counter: N/A History of alcohol / drug use?: Yes Withdrawal Symptoms: Tingling, Tremors, Patient aware of relationship between substance abuse and physical/medical complications, Blackouts, Sweats  Substance #1 Name of Substance 1: ETOH (beer) 1 - Age of First Use: Teens 1 - Amount (size/oz): 6-12 pack per day 1 - Frequency: Daily use 1 - Duration: On-going 1 - Last Use / Amount: 12/01 Substance #2 Name of Substance 2: Crack 2 - Age of First Use: 20's 2 - Amount (size/oz): Says it depends if other people share it.  "I don't go seeking it out." 2 - Frequency: Only when others around me have it. 2 - Duration: On-going 2 - Last Use / Amount: Cannot remember.  CIWA: CIWA-Ar BP: 127/64 mmHg Pulse Rate: 82 Nausea and Vomiting: no nausea and no vomiting Tactile Disturbances: none Tremor: no tremor Auditory Disturbances: not present Paroxysmal Sweats: no sweat  visible Visual Disturbances: not present Anxiety: mildly anxious Headache, Fullness in Head: none present Agitation: normal activity Orientation and Clouding of Sensorium: oriented and can do serial additions CIWA-Ar Total: 1 COWS:    Allergies: No Known Allergies  Home Medications:  (Not in a hospital admission)  OB/GYN Status:  No LMP for male patient.  General Assessment Data Location of Assessment: [facility] Is this a Tele or Face-to-Face Assessment?: Face-to-Face Is this an Initial Assessment or a Re-assessment for this encounter?: Initial Assessment Living Arrangements: Spouse/significant other (Living w/ girlfriend of 10 yrs and her son.) Can pt return to current living arrangement?: Yes Admission Status: Involuntary Is patient capable of signing voluntary admission?: No Transfer from: Acute Hospital Referral Source: Self/Family/Friend     [facility] Crisis Care Plan Living Arrangements: Spouse/significant other (Living w/ girlfriend of 10 yrs and her son.) Name of Psychiatrist: None Name of Therapist: N/A     Risk to self with the past 6 months Suicidal Ideation: Yes-Currently Present Suicidal Intent: No Is patient at risk for suicide?: No Suicidal Plan?: No-Not Currently/Within Last 6 Months Access to Means: Yes Specify Access to Suicidal Means: Pt has guns in home. What has been your use of drugs/alcohol within the last 12 months?: ETOH use daily; cocaine use occasionally. Previous Attempts/Gestures: No How many times?: 0 Other Self Harm Risks: Pt denies Triggers for Past Attempts: None known Intentional Self Injurious Behavior: None Family Suicide History: Unknown Recent stressful life event(s): Conflict (Comment), Legal Issues, Financial Problems, Job Loss (Conflict w/ gf; mother died a year ago, court date coming up) Persecutory voices/beliefs?: Yes Depression: Yes Depression Symptoms: Insomnia, Loss of interest in usual pleasures, Isolating, Feeling  angry/irritable Substance abuse history and/or treatment for substance abuse?: Yes Suicide prevention information given to non-admitted patients: Not applicable  Risk to Others within the past 6 months Homicidal Ideation: No Thoughts of Harm to Others: No-Not Currently Present/Within Last 6 Months Current Homicidal Intent: No Current Homicidal Plan: No Access to Homicidal Means: No Identified Victim: No one History of harm to others?: Yes Assessment of Violence: In distant past Violent Behavior Description: Has gotteninto fights in the past Does patient have access to weapons?: Yes (Comment) (Pt has gun in the home.) Criminal Charges Pending?: Yes Describe Pending Criminal Charges: Trespassing Does patient have a court date: Yes Court Date: 01/05/14  Psychosis Hallucinations: Auditory, Visual (Seeing dead people and hearing voices telling him "Come home) Delusions: None noted  Mental Status Report Appear/Hygiene: Unremarkable, In scrubs Eye Contact: Good Motor Activity: Freedom of movement, Unremarkable Speech: Logical/coherent Level of Consciousness: Alert Mood: Depressed, Anxious, Helpless, Sad Affect: Anxious, Irritable, Threatening Anxiety Level: Severe Thought Processes: Coherent, Relevant Judgement: Impaired Orientation: Person, Place, Situation Obsessive Compulsive Thoughts/Behaviors: Minimal  Cognitive Functioning Concentration: Decreased  Memory: Recent Impaired, Remote Intact IQ: Average Insight: Poor Impulse Control: Fair Appetite: Fair Weight Loss: 0 Weight Gain: 0 Sleep: Decreased Total Hours of Sleep:  (Up & down all night. <6H/D) Vegetative Symptoms: None  ADLScreening [facility] Assessment Services) Patient's cognitive ability adequate to safely complete daily activities?: Yes Patient able to express need for assistance with ADLs?: Yes Independently performs ADLs?: Yes (appropriate for developmental age)  Prior Inpatient Therapy Prior Inpatient  Therapy: No Prior Therapy Dates: N/A Prior Therapy Facilty/Provider(s): N/A Reason for Treatment: N/A  Prior Outpatient Therapy Prior Outpatient Therapy: Yes Prior Therapy Dates: Over a year ago Prior Therapy Facilty/Provider(s): [facility] Reason for Treatment: Mood stabilization  ADL Screening (condition at time of admission) Patient's cognitive ability adequate to safely complete daily activities?: Yes Is the patient deaf or have difficulty hearing?: No Does the patient have difficulty seeing, even when wearing glasses/contacts?: Yes (Left eye is visibly impaired.) Does the patient have difficulty concentrating, remembering, or making decisions?: No Patient able to express need for assistance with ADLs?: Yes Does the patient have difficulty dressing or bathing?: No Independently performs ADLs?: Yes (appropriate for developmental age) Does the patient have difficulty walking or climbing stairs?: No Weakness of Legs: None Weakness of Arms/Hands: None       Abuse/Neglect Assessment (Assessment to be complete while patient is alone) Physical Abuse: Denies Verbal Abuse: Denies Sexual Abuse: Denies Exploitation of patient/patient's resources: Denies Self-Neglect: Denies     Merchant navy officerAdvance Directives (For Healthcare) Does patient have an advance directive?: No Would patient like information on creating an advanced directive?: No - patient declined information    Additional Information 1:1 In Past 12 Months?: No CIRT Risk: No Elopement Risk: No Does patient have medical clearance?: Yes     Disposition:  Disposition Initial Assessment Completed for this Encounter: Yes Disposition of Patient: Other dispositions Other disposition(s): Other (Comment) (To be reviewed by psychiatry to uphold/rescind IVC.)  On Site Evaluation by:   Reviewed with Physician:    Beatriz StallionHarvey, Aquan Kope Ray 12/29/2013 10:05 PM

## 2013-12-30 DIAGNOSIS — F1020 Alcohol dependence, uncomplicated: Secondary | ICD-10-CM | POA: Diagnosis present

## 2013-12-30 DIAGNOSIS — F1994 Other psychoactive substance use, unspecified with psychoactive substance-induced mood disorder: Secondary | ICD-10-CM | POA: Diagnosis present

## 2013-12-30 DIAGNOSIS — F1410 Cocaine abuse, uncomplicated: Secondary | ICD-10-CM | POA: Diagnosis present

## 2013-12-30 MED ORDER — FLUOXETINE HCL 10 MG PO CAPS
10.0000 mg | ORAL_CAPSULE | Freq: Every day | ORAL | Status: DC
  Administered 2013-12-30: 10 mg via ORAL
  Filled 2013-12-30: qty 1

## 2013-12-30 MED ORDER — CARBAMIDE PEROXIDE 6.5 % OT SOLN: 5.0000 [drp] | OTIC | Status: DC

## 2013-12-30 MED ORDER — IBUPROFEN 200 MG PO TABS: 400.0000 mg | ORAL_TABLET | Freq: Four times a day (QID) | ORAL | Status: DC | PRN

## 2013-12-30 MED ORDER — FLUOXETINE HCL 10 MG PO CAPS: 10.0000 mg | ORAL_CAPSULE | Freq: Every day | ORAL | Status: DC

## 2013-12-30 NOTE — ED Notes (Signed)
Patient met with treatment team. States he made up the suicidal statements because his "girlfriend was getting on my nerves."  He reports that he went to [facility] to see a physician regarding his urinary urgency issues.  He was irritated over the way he was treated there and stopped going.  He said someone from medicaid came to his house and that's when he started making statement about suicide to "piss off my girlfriend."  He is tired of "my girlfriend is on dialysis and that's all she talks about.  I just need to get out."  Patient states he drinks about a 6 pk or more per day.  He reports hearing voices periodically, but denies any AVH or SI/HI today.  He is agreeable to discharge home today.

## 2013-12-30 NOTE — BHH Suicide Risk Assessment (Signed)
Suicide Risk Assessment  Discharge Assessment     Demographic Factors:  Male and Unemployed  Total Time spent with patient: 30 minutes Psychiatric Specialty Exam:     Blood pressure 137/82, pulse 99, temperature 98 F (36.7 C), temperature source Oral, resp. rate 15, SpO2 96 %.There is no weight on file to calculate BMI.  General Appearance: Fairly Groomed  Patent attorneyye Contact::  Fair  Speech:  Clear and Coherent normal rate rhythm and prosody   Volume:  Normal  Mood:  Euthymic  Affect:  Congruent  Thought Process:  Coherent, Goal Directed and Logical  Orientation:  Full (Time, Place, and Person)  Thought Content:  WDL  Suicidal Thoughts:  No  Homicidal Thoughts:  No  Memory:  Immediate;   Good Recent;   Good Remote;   Good  Judgement:  Fair  Insight:  Fair  Psychomotor Activity:  Normal  Concentration:  Fair  Recall:  Fair  Fund of Knowledge:Fair  Language: Good  Akathisia:  No  Handed:  Right  AIMS (if indicated):     Assets:  Communication Skills Housing Social Support  Sleep:      Musculoskeletal: Strength & Muscle Tone: within normal limits Gait & Station: normal Patient leans: N/A  Mental Status Per Nursing Assessment::   On Admission:     Patient endorsed suicidal ideation with plan in context of polysubstance abuse and Acute alcohol intoxication  Current Mental Status by Physician: denies intention to harm self or others.  Denies access to firearms although endorsed plan on admission to shoot self.  states made SI statements to make GF angry   Loss Factors: NA  Historical Factors: Family history of mental illness or substance abuse  Risk Reduction Factors:   Positive social support, Positive therapeutic relationship and Positive coping skills or problem solving skills  Continued Clinical Symptoms:  Alcohol/Substance Abuse/Dependencies  Cognitive Features That Contribute To Risk:  none  Suicide Risk:  Minimal: No identifiable suicidal ideation.   Patients presenting with no risk factors but with morbid ruminations; may be classified as minimal risk based on the severity of the depressive symptoms  Discharge Diagnoses:   AXIS I:  Substance Induced Mood Disorder and poly substance abuse  AXIS II:  Deferred AXIS III:   Past Medical History  Diagnosis Date  . Blindness of left eye   . Hepatitis C    AXIS IV:  other psychosocial or environmental problems and problems with access to health care services AXIS V:  61-70 mild symptoms  Plan Of Care/Follow-up recommendations:  Activity:  as tolerated  Diet:  heart healthy   Is patient on multiple antipsychotic therapies at discharge:  No   Has Patient had three or more failed trials of antipsychotic monotherapy by history:  No  Recommended Plan for Multiple Antipsychotic Therapies: NA    Bonnetta Barryisbach, Kenith Trickel  PMH-NP  12/30/2013, 8:52 PM

## 2013-12-30 NOTE — ED Notes (Signed)
Patient discharged home.  Left with bus ticket.  [facility]

## 2013-12-30 NOTE — Progress Notes (Signed)
  CARE MANAGEMENT ED NOTE 12/30/2013  Patient:  [facility]Secundino   Account Number:  [id number]  Date Initiated:  12/30/2013  Documentation initiated by:  Edd ArbourGIBBS,KIMBERLY  Subjective/Objective Assessment:   58 yr old [place] pt brought in my [employer], IVC, papers state that "pt has been suffering from AH/VH. States that certain family members who committed suicide are telling him to come home. Pt says he plans to     Subjective/Objective Assessment Detail:   shoot himself. Admits to having guns in the home. Hx of Hep C and is severe alcoholic, and possible UTI. Pt also seems to have some racial delusions. Respondent thinks other races are out to get him. He also uses profanity and racial slurs towards others. Pt is at his girl friend's house where he is not suppose to be. He has a pending court date for threatening and also breaking and entering. Pt can be aggressive appears to be a danger to himself and others"      [facility]   Address: [address]  [place], [place] [id number]   Contact: [facility]  Telephone: [phone]     Per EPIC medicaid [place] access response history   Pt has not been to pcp per any EPIC notes      Action/Plan:   ED CM consulted by ED SW about pt need for pcp f/u for medical issues.  CM informed SW pt has a pcp per [employer] [place] access but Cm unable to find he has seen provider per EPIC.  1053 CM [facility]   Action/Plan Detail:   been seen CM attempted to make pt an appt but Monique states pt need to call back on "next week" to schedule a "hospital follow up appointment"  Cm entered info in discharge f/u section of EPIC for ED RN to give to pt at d/c SW updated   Anticipated DC Date:  12/30/2013     Status Recommendation to Physician:   Result of Recommendation:    Other ED Services  Consult Working Plan    DC Planning Services   Other  Outpatient Services - Pt will follow up  PCP issues    Choice offered to / List presented to:            Status of service:  Completed, signed off  ED Comments:   ED Comments Detail:   Follow-up With Details Why Contact Info [facility] Schedule an appointment as soon as possible for a visit on 12/31/2013 You need to call [facility] next week to schedule your hospital follow up appointment. [facility] is your assigned medicaid family doctor. (listed on your medicaid card) If you prefer to change this call DSS [address] [place] [id number] [phone]

## 2013-12-30 NOTE — Discharge Instructions (Addendum)
For psychiatry and medication management:  Continue your treatment with [facility].  If you do not already have an appointment, they provide walk-in treatment on a first-come, first-served basis from 8:00 am - 3:00 pm, Monday - Friday.  Try to arrive as early as possible for the best chance of being seen that day.       [facility]      [address]      [place], [place] [address]      [phone]  If you have a behavioral health crisis you have several options, all of which are available 24 hours a day, 7 days a week:  *Call Mobile Crisis and a clinician will come to you: [phone] *Call 911 *Go to the [facility] at [address], [place], [place] *Go to your local hospital emergency department

## 2013-12-30 NOTE — Progress Notes (Addendum)
Per psychiatrist, patient requested assistant with appointment with [facility] for prostate issues. CSW called and inquired however since pt has medicaid there are issues with being a new patient. CSW called RN CM. [facility] and has not utilized.   Byrd HesselbachKristen Shaquinta Peruski, LCSW [phone]  ED CSW 12/30/2013 [id number]
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
## 2014-04-21 ENCOUNTER — Ambulatory Visit: Payer: Medicaid Other | Attending: Internal Medicine | Admitting: Internal Medicine

## 2014-04-21 ENCOUNTER — Encounter: Payer: Self-pay | Admitting: Internal Medicine

## 2014-04-21 VITALS — BP 146/90 | HR 97 | Temp 98.2°F | Resp 16 | Wt 164.6 lb

## 2014-04-21 DIAGNOSIS — R1032 Left lower quadrant pain: Secondary | ICD-10-CM

## 2014-04-21 DIAGNOSIS — R030 Elevated blood-pressure reading, without diagnosis of hypertension: Secondary | ICD-10-CM

## 2014-04-21 DIAGNOSIS — F1911 Other psychoactive substance abuse, in remission: Secondary | ICD-10-CM

## 2014-04-21 DIAGNOSIS — F1010 Alcohol abuse, uncomplicated: Secondary | ICD-10-CM | POA: Diagnosis not present

## 2014-04-21 DIAGNOSIS — I10 Essential (primary) hypertension: Secondary | ICD-10-CM | POA: Insufficient documentation

## 2014-04-21 DIAGNOSIS — F17210 Nicotine dependence, cigarettes, uncomplicated: Secondary | ICD-10-CM | POA: Insufficient documentation

## 2014-04-21 DIAGNOSIS — IMO0001 Reserved for inherently not codable concepts without codable children: Secondary | ICD-10-CM | POA: Insufficient documentation

## 2014-04-21 DIAGNOSIS — Z8619 Personal history of other infectious and parasitic diseases: Secondary | ICD-10-CM | POA: Diagnosis not present

## 2014-04-21 DIAGNOSIS — F39 Unspecified mood [affective] disorder: Secondary | ICD-10-CM | POA: Diagnosis not present

## 2014-04-21 DIAGNOSIS — R351 Nocturia: Secondary | ICD-10-CM | POA: Diagnosis not present

## 2014-04-21 DIAGNOSIS — Z87898 Personal history of other specified conditions: Secondary | ICD-10-CM

## 2014-04-21 LAB — COMPLETE METABOLIC PANEL WITH GFR
ALBUMIN: 2.8 g/dL — AB (ref 3.5–5.2)
ALT: 42 U/L (ref 0–53)
AST: 61 U/L — ABNORMAL HIGH (ref 0–37)
Alkaline Phosphatase: 258 U/L — ABNORMAL HIGH (ref 39–117)
BUN: 8 mg/dL (ref 6–23)
CO2: 28 mEq/L (ref 19–32)
Calcium: 8.5 mg/dL (ref 8.4–10.5)
Chloride: 107 mEq/L (ref 96–112)
Creat: 0.82 mg/dL (ref 0.50–1.35)
GFR, Est African American: >89 mL/min
GLUCOSE: 147 mg/dL — AB (ref 70–99)
Potassium: 4.4 mEq/L (ref 3.5–5.3)
Sodium: 141 mEq/L (ref 135–145)
Total Bilirubin: 0.9 mg/dL (ref 0.2–1.2)
Total Protein: 7.8 g/dL (ref 6.0–8.3)

## 2014-04-21 LAB — POCT URINALYSIS DIPSTICK
BILIRUBIN UA: NEGATIVE
Blood, UA: NEGATIVE
GLUCOSE UA: NEGATIVE
Ketones, UA: NEGATIVE
Leukocytes, UA: NEGATIVE
Nitrite, UA: NEGATIVE
PH UA: 5.5
Protein, UA: NEGATIVE
Spec Grav, UA: <=1.005
Urobilinogen, UA: 0.2

## 2014-04-21 NOTE — Progress Notes (Signed)
Patient here to establish care Was diagnosed with hep c while in prison in [place] Patient states having trouble sleeping Has some pain and swelling to left side of his abd and some trouble voiding Patient presents upset-girlfriend is in hospital as a recent amputee Patient did admit to drinking a beer this am

## 2014-04-21 NOTE — Patient Instructions (Signed)
DASH Eating Plan DASH stands for "Dietary Approaches to Stop Hypertension." The DASH eating plan is a healthy eating plan that has been shown to reduce high blood pressure (hypertension). Additional health benefits may include reducing the risk of type 2 diabetes mellitus, heart disease, and stroke. The DASH eating plan may also help with weight loss. WHAT DO I NEED TO KNOW ABOUT THE DASH EATING PLAN? For the DASH eating plan, you will follow these general guidelines:  Choose foods with a percent daily value for sodium of less than 5% (as listed on the food label).  Use salt-free seasonings or herbs instead of table salt or sea salt.  Check with your health care provider or pharmacist before using salt substitutes.  Eat lower-sodium products, often labeled as "lower sodium" or "no salt added."  Eat fresh foods.  Eat more vegetables, fruits, and low-fat dairy products.  Choose whole grains. Look for the word "whole" as the first word in the ingredient list.  Choose fish and skinless chicken or turkey more often than red meat. Limit fish, poultry, and meat to 6 oz (170 g) each day.  Limit sweets, desserts, sugars, and sugary drinks.  Choose heart-healthy fats.  Limit cheese to 1 oz (28 g) per day.  Eat more home-cooked food and less restaurant, buffet, and fast food.  Limit fried foods.  Cook foods using methods other than frying.  Limit canned vegetables. If you do use them, rinse them well to decrease the sodium.  When eating at a restaurant, ask that your food be prepared with less salt, or no salt if possible. WHAT FOODS CAN I EAT? Seek help from a dietitian for individual calorie needs. Grains Whole grain or whole wheat bread. Brown rice. Whole grain or whole wheat pasta. Quinoa, bulgur, and whole grain cereals. Low-sodium cereals. Corn or whole wheat flour tortillas. Whole grain cornbread. Whole grain crackers. Low-sodium crackers. Vegetables Fresh or frozen vegetables  (raw, steamed, roasted, or grilled). Low-sodium or reduced-sodium tomato and vegetable juices. Low-sodium or reduced-sodium tomato sauce and paste. Low-sodium or reduced-sodium canned vegetables.  Fruits All fresh, canned (in natural juice), or frozen fruits. Meat and Other Protein Products Ground beef (85% or leaner), grass-fed beef, or beef trimmed of fat. Skinless chicken or turkey. Ground chicken or turkey. Pork trimmed of fat. All fish and seafood. Eggs. Dried beans, peas, or lentils. Unsalted nuts and seeds. Unsalted canned beans. Dairy Low-fat dairy products, such as skim or 1% milk, 2% or reduced-fat cheeses, low-fat ricotta or cottage cheese, or plain low-fat yogurt. Low-sodium or reduced-sodium cheeses. Fats and Oils Tub margarines without trans fats. Light or reduced-fat mayonnaise and salad dressings (reduced sodium). Avocado. Safflower, olive, or canola oils. Natural peanut or almond butter. Other Unsalted popcorn and pretzels. The items listed above may not be a complete list of recommended foods or beverages. Contact your dietitian for more options. WHAT FOODS ARE NOT RECOMMENDED? Grains White bread. White pasta. White rice. Refined cornbread. Bagels and croissants. Crackers that contain trans fat. Vegetables Creamed or fried vegetables. Vegetables in a cheese sauce. Regular canned vegetables. Regular canned tomato sauce and paste. Regular tomato and vegetable juices. Fruits Dried fruits. Canned fruit in light or heavy syrup. Fruit juice. Meat and Other Protein Products Fatty cuts of meat. Ribs, chicken wings, bacon, sausage, bologna, salami, chitterlings, fatback, hot dogs, bratwurst, and packaged luncheon meats. Salted nuts and seeds. Canned beans with salt. Dairy Whole or 2% milk, cream, half-and-half, and cream cheese. Whole-fat or sweetened yogurt. Full-fat   cheeses or blue cheese. Nondairy creamers and whipped toppings. Processed cheese, cheese spreads, or cheese  curds. Condiments Onion and garlic salt, seasoned salt, table salt, and sea salt. Canned and packaged gravies. Worcestershire sauce. Tartar sauce. Barbecue sauce. Teriyaki sauce. Soy sauce, including reduced sodium. Steak sauce. Fish sauce. Oyster sauce. Cocktail sauce. Horseradish. Ketchup and mustard. Meat flavorings and tenderizers. Bouillon cubes. Hot sauce. Tabasco sauce. Marinades. Taco seasonings. Relishes. Fats and Oils Butter, stick margarine, lard, shortening, ghee, and bacon fat. Coconut, palm kernel, or palm oils. Regular salad dressings. Other Pickles and olives. Salted popcorn and pretzels. The items listed above may not be a complete list of foods and beverages to avoid. Contact your dietitian for more information. WHERE CAN I FIND MORE INFORMATION? National Heart, Lung, and Blood Institute: www.nhlbi.nih.gov/health/health-topics/topics/dash/ Document Released: 01/04/2011 Document Revised: 06/01/2013 Document Reviewed: 11/19/2012 ExitCare Patient Information 2015 [employer]. This information is not intended to replace advice given to you by your health care provider. Make sure you discuss any questions you have with your health care provider. Smoking Cessation Quitting smoking is important to your health and has many advantages. However, it is not always easy to quit since nicotine is a very addictive drug. Oftentimes, people try 3 times or more before being able to quit. This document explains the best ways for you to prepare to quit smoking. Quitting takes hard work and a lot of effort, but you can do it. ADVANTAGES OF QUITTING SMOKING  You will live longer, feel better, and live better.  Your body will feel the impact of quitting smoking almost immediately.  Within 20 minutes, blood pressure decreases. Your pulse returns to its normal level.  After 8 hours, carbon monoxide levels in the blood return to normal. Your oxygen level increases.  After 24 hours, the chance of  having a heart attack starts to decrease. Your breath, hair, and body stop smelling like smoke.  After 48 hours, damaged nerve endings begin to recover. Your sense of taste and smell improve.  After 72 hours, the body is virtually free of nicotine. Your bronchial tubes relax and breathing becomes easier.  After 2 to 12 weeks, lungs can hold more air. Exercise becomes easier and circulation improves.  The risk of having a heart attack, stroke, cancer, or lung disease is greatly reduced.  After 1 year, the risk of coronary heart disease is cut in half.  After 5 years, the risk of stroke falls to the same as a nonsmoker.  After 10 years, the risk of lung cancer is cut in half and the risk of other cancers decreases significantly.  After 15 years, the risk of coronary heart disease drops, usually to the level of a nonsmoker.  If you are pregnant, quitting smoking will improve your chances of having a healthy baby.  The people you live with, especially any children, will be healthier.  You will have extra money to spend on things other than cigarettes. QUESTIONS TO THINK ABOUT BEFORE ATTEMPTING TO QUIT You may want to talk about your answers with your health care provider.  Why do you want to quit?  If you tried to quit in the past, what helped and what did not?  What will be the most difficult situations for you after you quit? How will you plan to handle them?  Who can help you through the tough times? Your family? Friends? A health care provider?  What pleasures do you get from smoking? What ways can you still get pleasure   if you quit? Here are some questions to ask your health care provider:  How can you help me to be successful at quitting?  What medicine do you think would be best for me and how should I take it?  What should I do if I need more help?  What is smoking withdrawal like? How can I get information on withdrawal? GET READY  Set a quit date.  Change your  environment by getting rid of all cigarettes, ashtrays, matches, and lighters in your home, car, or work. Do not let people smoke in your home.  Review your past attempts to quit. Think about what worked and what did not. GET SUPPORT AND ENCOURAGEMENT You have a better chance of being successful if you have help. You can get support in many ways.  Tell your family, friends, and coworkers that you are going to quit and need their support. Ask them not to smoke around you.  Get individual, group, or telephone counseling and support. Programs are available at local hospitals and health centers. Call your local health department for information about programs in your area.  Spiritual beliefs and practices may help some smokers quit.  Download a "quit meter" on your computer to keep track of quit statistics, such as how long you have gone without smoking, cigarettes not smoked, and money saved.  Get a self-help book about quitting smoking and staying off tobacco. LEARN NEW SKILLS AND BEHAVIORS  Distract yourself from urges to smoke. Talk to someone, go for a walk, or occupy your time with a task.  Change your normal routine. Take a different route to work. Drink tea instead of coffee. Eat breakfast in a different place.  Reduce your stress. Take a hot bath, exercise, or read a book.  Plan something enjoyable to do every day. Reward yourself for not smoking.  Explore interactive web-based programs that specialize in helping you quit. GET MEDICINE AND USE IT CORRECTLY Medicines can help you stop smoking and decrease the urge to smoke. Combining medicine with the above behavioral methods and support can greatly increase your chances of successfully quitting smoking.  Nicotine replacement therapy helps deliver nicotine to your body without the negative effects and risks of smoking. Nicotine replacement therapy includes nicotine gum, lozenges, inhalers, nasal sprays, and skin patches. Some may be  available over-the-counter and others require a prescription.  Antidepressant medicine helps people abstain from smoking, but how this works is unknown. This medicine is available by prescription.  Nicotinic receptor partial agonist medicine simulates the effect of nicotine in your brain. This medicine is available by prescription. Ask your health care provider for advice about which medicines to use and how to use them based on your health history. Your health care provider will tell you what side effects to look out for if you choose to be on a medicine or therapy. Carefully read the information on the package. Do not use any other product containing nicotine while using a nicotine replacement product.  RELAPSE OR DIFFICULT SITUATIONS Most relapses occur within the first 3 months after quitting. Do not be discouraged if you start smoking again. Remember, most people try several times before finally quitting. You may have symptoms of withdrawal because your body is used to nicotine. You may crave cigarettes, be irritable, feel very hungry, cough often, get headaches, or have difficulty concentrating. The withdrawal symptoms are only temporary. They are strongest when you first quit, but they will go away within 10-14 days. To reduce the   chances of relapse, try to:  Avoid drinking alcohol. Drinking lowers your chances of successfully quitting.  Reduce the amount of caffeine you consume. Once you quit smoking, the amount of caffeine in your body increases and can give you symptoms, such as a rapid heartbeat, sweating, and anxiety.  Avoid smokers because they can make you want to smoke.  Do not let weight gain distract you. Many smokers will gain weight when they quit, usually less than 10 pounds. Eat a healthy diet and stay active. You can always lose the weight gained after you quit.  Find ways to improve your mood other than smoking. FOR MORE INFORMATION  www.smokefree.gov  Document Released:  01/09/2001 Document Revised: 06/01/2013 Document Reviewed: 04/26/2011 ExitCare Patient Information 2015 [employer]. This information is not intended to replace advice given to you by your health care provider. Make sure you discuss any questions you have with your health care provider.  

## 2014-04-21 NOTE — Progress Notes (Signed)
Patient Demographics  Albert Salazar, is a 59 y.o. male  OZH:[id number]  GEX:[id number]  DOB - 05/16/1955  CC:  Chief Complaint  Patient presents with  . Establish Care       HPI: Albert Salazar is a 59 y.o. male here today to establish medical care.patient has history of substance abuse, IV drug abuse, alcohol abuse, history of mood disorder, currently he has been having symptoms of urinary frequency nocturia denies any dysuria has on and off symptoms of erectile dysfunction, as per patient he has not been following up with his psychiatrist currently not on any medications but denies any SI or HI, patient still drinks alcohol, as counseled patient to cut down, as per patient he was also told in the past that he has hepatitis C patient has never been treated. Today's blood pressure is borderline elevated denies any headache dizziness chest and shortness of breath. Patient has No headache, No chest pain, No abdominal pain - No Nausea, No new weakness tingling or numbness, No Cough - SOB.  No Known Allergies Past Medical History  Diagnosis Date  . Blindness of left eye   . Hepatitis C    Current Outpatient Prescriptions on File Prior to Visit  Medication Sig Dispense Refill  . acetaminophen (TYLENOL) 500 MG tablet Take 1,000 mg by mouth every 4 (four) hours as needed for moderate pain or headache.    . carbamide peroxide (DEBROX) 6.5 % otic solution Place 5 drops into both ears every 30 (thirty) days. 15 mL   . FLUoxetine (PROZAC) 10 MG capsule Take 1 capsule (10 mg total) by mouth daily. 30 capsule 3  . ibuprofen (ADVIL,MOTRIN) 200 MG tablet Take 2 tablets (400 mg total) by mouth every 6 (six) hours as needed for moderate pain. 30 tablet 0  . Tetrahydrozoline HCl (VISINE EXTRA OP) Apply 1-2 drops to eye daily as needed (redness.).     No current facility-administered medications on file prior to visit.   Family History  Problem Relation Age of Onset  . Diabetes Brother     History   Social History  . Marital Status: Married    Spouse Name: N/A  . Number of Children: N/A  . Years of Education: N/A   Occupational History  . Not on file.   Social History Main Topics  . Smoking status: Current Every Day Smoker -- 0.50 packs/day for 40 years  . Smokeless tobacco: Not on file  . Alcohol Use: 4.8 oz/week    0 Standard drinks or equivalent, 8 Cans of beer per week  . Drug Use: No  . Sexual Activity: Not on file   Other Topics Concern  . Not on file   Social History Narrative    Review of Systems: Constitutional: Negative for fever, chills, diaphoresis, activity change, appetite change and fatigue. HENT: Negative for ear pain, nosebleeds, congestion, facial swelling, rhinorrhea, neck pain, neck stiffness and ear discharge.  Eyes: Negative for pain, discharge, redness, itching and visual disturbance. Respiratory: Negative for cough, choking, chest tightness, shortness of breath, wheezing and stridor.  Cardiovascular: Negative for chest pain, palpitations and leg swelling. Gastrointestinal: Negative for abdominal distention. Genitourinary: Negative for dysuria, urgency, frequency, hematuria, flank pain, decreased urine volume, difficulty urinating and dyspareunia.  Musculoskeletal: Negative for back pain, joint swelling, arthralgia and gait problem. Neurological: Negative for dizziness, tremors, seizures, syncope, facial asymmetry, speech difficulty, weakness, light-headedness, numbness and headaches.  Hematological: Negative for adenopathy. Does not bruise/bleed easily. Psychiatric/Behavioral: Negative for hallucinations, behavioral  problems, confusion, dysphoric mood, decreased concentration and agitation.    Objective:   Filed Vitals:   04/21/14 0954  BP: 146/90  Pulse: 97  Temp: 98.2 F (36.8 C)  Resp: 16    Physical Exam: Constitutional: Patient appears well-developed and well-nourished. No distress. HENT: Normocephalic, atraumatic,  External right and left ear normal. Oropharynx is clear and moist.  Eyes: Conjunctivae and EOM are normal. Blind in the left eye Neck: Normal ROM. Neck supple. No JVD. No tracheal deviation. No thyromegaly. CVS: RRR, S1/S2 +, no murmurs, no gallops, no carotid bruit.  Pulmonary: Effort and breath sounds normal, no stridor, rhonchi, wheezes, rales.  Abdominal: Soft. BS +, no distension, tenderness, rebound or guarding.  Musculoskeletal: Normal range of motion. No edema and no tenderness.  Neuro: Alert. Normal reflexes, muscle tone coordination. No cranial nerve deficit. Skin: Skin is warm and dry. No rash noted. Not diaphoretic. No erythema. No pallor. Psychiatric: Normal mood and affect. Behavior, judgment, thought content normal.  Lab Results  Component Value Date   WBC 6.4 12/29/2013   HGB 12.2* 12/29/2013   HCT 37.0* 12/29/2013   MCV 98.9 12/29/2013   PLT  12/29/2013    PLATELET CLUMPS NOTED ON SMEAR, COUNT APPEARS DECREASED   Lab Results  Component Value Date   CREATININE 0.91 12/29/2013   BUN 10 12/29/2013   NA 141 12/29/2013   K 4.2 12/29/2013   CL 105 12/29/2013   CO2 24 12/29/2013    No results found for: HGBA1C Lipid Panel  No results found for: CHOL, TRIG, HDL, CHOLHDL, VLDL, LDLCALC     Assessment and plan:   1. Left lower quadrant pain  - Urinalysis Dipstick is negative for infection Results for orders placed or performed in visit on 04/21/14  Urinalysis Dipstick  Result Value Ref Range   Color, UA yellow    Clarity, UA clear    Glucose, UA neg    Bilirubin, UA neg    Ketones, UA neg    Spec Grav, UA <=1.005    Blood, UA neg    pH, UA 5.5    Protein, UA neg    Urobilinogen, UA 0.2    Nitrite, UA neg    Leukocytes, UA Negative     2. Elevated BP Advise patient for DASH diet, will check baseline blood work.  - CBC with Differential/Platelet - COMPLETE METABOLIC PANEL WITH GFR - Vit D  25 hydroxy (rtn osteoporosis monitoring) - Hemoglobin  A1c  3. Alcohol abuse/4. History of substance abuse Also patient to cut down and quit drinking patient will think about it  5. Mood disorder Patient currently denies any SI or HI, at this time patient does not want to see a psychiatrist. Maryclare Labrador reevaluate on the next visit.  6. History of hepatitis C  - Hepatitis B surface antibody; Future - Hepatitis B surface antigen; Future - Hepatitis C antibody; Future - Hepatitis C RNA quantitative  7. Nocturia Will check - PSA level    Return in about 3 months (around 07/22/2014).  The patient was given clear instructions to go to ER or return to medical center if symptoms don't improve, worsen or new problems develop. The patient verbalized understanding. The patient was told to call to get lab results if they haven't heard anything in the next week.    This note has been created with Education officer, environmental. Any transcriptional errors are unintentional.   Doris Cheadle, MD

## 2014-04-22 LAB — CBC WITH DIFFERENTIAL/PLATELET
BASOS ABS: 0.1 10*3/uL (ref 0.0–0.1)
BASOS PCT: 1 % (ref 0–1)
Eosinophils Absolute: 0.1 10*3/uL (ref 0.0–0.7)
Eosinophils Relative: 1 % (ref 0–5)
HCT: 37.2 % — ABNORMAL LOW (ref 39.0–52.0)
HEMOGLOBIN: 12.1 g/dL — AB (ref 13.0–17.0)
LYMPHS ABS: 3.5 10*3/uL (ref 0.7–4.0)
Lymphocytes Relative: 63 % — ABNORMAL HIGH (ref 12–46)
MCH: 33.1 pg (ref 26.0–34.0)
MCHC: 32.5 g/dL (ref 30.0–36.0)
MCV: 101.6 fL — ABNORMAL HIGH (ref 78.0–100.0)
MONOS PCT: 9 % (ref 3–12)
MPV: 10.4 fL (ref 8.6–12.4)
Monocytes Absolute: 0.5 10*3/uL (ref 0.1–1.0)
Neutro Abs: 1.5 10*3/uL — ABNORMAL LOW (ref 1.7–7.7)
Neutrophils Relative %: 26 % — ABNORMAL LOW (ref 43–77)
Platelets: 50 10*3/uL — ABNORMAL LOW (ref 150–400)
RBC: 3.66 MIL/uL — ABNORMAL LOW (ref 4.22–5.81)
RDW: 15.2 % (ref 11.5–15.5)
WBC: 5.6 10*3/uL (ref 4.0–10.5)

## 2014-04-22 LAB — HEMOGLOBIN A1C
Hgb A1c MFr Bld: 5.7 % — ABNORMAL HIGH (ref <5.7)
MEAN PLASMA GLUCOSE: 117 mg/dL — AB (ref <117)

## 2014-04-22 LAB — VITAMIN D 25 HYDROXY (VIT D DEFICIENCY, FRACTURES): VIT D 25 HYDROXY: 11 ng/mL — AB (ref 30–100)

## 2014-04-22 LAB — PSA: PSA: 2.32 ng/mL (ref <=4.00)

## 2014-04-23 LAB — HEPATITIS C RNA QUANTITATIVE
HCV Quantitative Log: 5.72 {Log} — ABNORMAL HIGH (ref <1.18)
HCV Quantitative: 523993 IU/mL — ABNORMAL HIGH (ref <15)

## 2014-04-26 ENCOUNTER — Telehealth: Payer: Self-pay

## 2014-04-26 DIAGNOSIS — B1920 Unspecified viral hepatitis C without hepatic coma: Secondary | ICD-10-CM

## 2014-04-26 NOTE — Telephone Encounter (Signed)
Patient not available Unable to leave message -no voice mail  

## 2014-04-26 NOTE — Telephone Encounter (Signed)
-----   Message from Doris Cheadleeepak Advani, MD sent at 04/23/2014 11:07 AM EDT ----- Call and let the patient know that his blood work shows patient has hepatitis C with elevated viral load, patient needs to follow with specialist, put in the referral to ID.  noticed hemoglobin A1c of  5.7%, patient has prediabetes, call and advise patient for low carbohydrate diet. Patient also has borderline anemia advised patient to take OTC iron supplement, will repeat A1c in 3 months. Patient can also be referred to GI for screening colonoscopy.  noticed low vitamin D, call patient advise to start ergocalciferol 50,000 units once a week for the duration of  12 weeks.

## 2014-05-28 ENCOUNTER — Other Ambulatory Visit: Payer: Medicaid Other

## 2014-05-28 DIAGNOSIS — B182 Chronic viral hepatitis C: Secondary | ICD-10-CM

## 2014-05-28 LAB — PROTIME-INR
INR: 1.51 — ABNORMAL HIGH (ref <1.50)
PROTHROMBIN TIME: 18.2 s — AB (ref 11.6–15.2)

## 2014-05-28 LAB — HEPATITIS B SURFACE ANTIGEN: HEP B S AG: NEGATIVE

## 2014-05-28 LAB — HEPATITIS A ANTIBODY, TOTAL: HEP A TOTAL AB: REACTIVE — AB

## 2014-05-28 LAB — HEPATITIS B CORE ANTIBODY, TOTAL: HEP B C TOTAL AB: NONREACTIVE

## 2014-05-28 LAB — IRON: IRON: 188 ug/dL — AB (ref 42–165)

## 2014-05-28 LAB — HEPATITIS B SURFACE ANTIBODY,QUALITATIVE: Hep B S Ab: NEGATIVE

## 2014-05-29 LAB — HIV ANTIBODY (ROUTINE TESTING W REFLEX): HIV: NONREACTIVE

## 2014-05-31 LAB — ANA: ANA: NEGATIVE

## 2014-06-02 LAB — HEPATITIS C GENOTYPE

## 2014-06-07 ENCOUNTER — Ambulatory Visit (INDEPENDENT_AMBULATORY_CARE_PROVIDER_SITE_OTHER): Payer: Medicaid Other | Admitting: Internal Medicine

## 2014-06-07 ENCOUNTER — Encounter: Payer: Self-pay | Admitting: Internal Medicine

## 2014-06-07 VITALS — BP 155/94 | HR 114 | Temp 98.0°F | Ht 69.0 in | Wt 169.0 lb

## 2014-06-07 DIAGNOSIS — B182 Chronic viral hepatitis C: Secondary | ICD-10-CM

## 2014-06-07 DIAGNOSIS — R188 Other ascites: Secondary | ICD-10-CM

## 2014-06-07 DIAGNOSIS — K7460 Unspecified cirrhosis of liver: Secondary | ICD-10-CM | POA: Diagnosis not present

## 2014-06-07 NOTE — Progress Notes (Signed)
+Albert Dan HumphreysMebane is a 59 y.o. male who presents for initial evaluation and management of a positive Hepatitis C antibody test.  Patient tested positive this year. Hepatitis C risk factors present are: IV drug abuse (details: last used several months ago). Patient denies renal dialysis, tattoos. Patient has had other studies performed. Results: hepatitis C RNA by PCR, result: positive. Patient has not had prior treatment for Hepatitis C. Patient does not have a past history of liver disease. Patient does not have a family history of liver disease.   HPI: He comes in here with 'swelling' of his abdomen for 2-3 days, by his report.  His platelets are low, INR up, albumin low all consistent with cirrhosis.  He is an active daily drinker and does get DTs off of alcohol.    Patient does have documented immunity to Hepatitis A. Patient does not have documented immunity to Hepatitis B.     Review of Systems A comprehensive review of systems was negative.   Past Medical History  Diagnosis Date  . Blindness of left eye   . Hepatitis C     Prior to Admission medications   Medication Sig Start Date End Date Taking? Authorizing Provider  acetaminophen (TYLENOL) 500 MG tablet Take 1,000 mg by mouth every 4 (four) hours as needed for moderate pain or headache.   Yes Historical Provider, MD  carbamide peroxide (DEBROX) 6.5 % otic solution Place 5 drops into both ears every 30 (thirty) days. 12/30/13  Yes Maurice MarchShelly S Eisbach, NP  FLUoxetine (PROZAC) 10 MG capsule Take 1 capsule (10 mg total) by mouth daily. 12/30/13  Yes Maurice MarchShelly S Eisbach, NP  ibuprofen (ADVIL,MOTRIN) 200 MG tablet Take 2 tablets (400 mg total) by mouth every 6 (six) hours as needed for moderate pain. 12/30/13  Yes Maurice MarchShelly S Eisbach, NP  Tetrahydrozoline HCl (VISINE EXTRA OP) Apply 1-2 drops to eye daily as needed (redness.).   Yes Historical Provider, MD    No Known Allergies  History  Substance Use Topics  . Smoking status: Current Every Day  Smoker -- 0.50 packs/day for 40 years  . Smokeless tobacco: Never Used  . Alcohol Use: 4.8 oz/week    0 Standard drinks or equivalent, 8 Cans of beer per week    Family History  Problem Relation Age of Onset  . Diabetes Brother       Objective:   Filed Vitals:   06/07/14 1041  BP: 155/94  Pulse: 114  Temp: 98 F (36.7 C)   in no apparent distress and alert HEENT: anicteric Cor RRR clear Bowel sounds are normal peripheral pulses normal, no pedal edema, no clubbing or cyanosis He has tense abdomen c/w ascites  Laboratory Genotype:  Lab Results  Component Value Date   HCVGENOTYPE 1a 05/28/2014   HCV viral load:  Lab Results  Component Value Date   HCVQUANT [id number]* 04/21/2014   Lab Results  Component Value Date   WBC 5.6 04/21/2014   HGB 12.1* 04/21/2014   HCT 37.2* 04/21/2014   MCV 101.6* 04/21/2014   PLT 50* 04/21/2014    Lab Results  Component Value Date   CREATININE 0.82 04/21/2014   BUN 8 04/21/2014   NA 141 04/21/2014   K 4.4 04/21/2014   CL 107 04/21/2014   CO2 28 04/21/2014    Lab Results  Component Value Date   ALT 42 04/21/2014   AST 61* 04/21/2014   ALKPHOS 258* 04/21/2014   BILITOT 0.9 04/21/2014   INR 1.51* 05/28/2014  Assessment: Chronic Hepatitis C genotype 1a  Plan: 1) Patient counseled extensively on limiting acetaminophen to no more than 2 grams daily, avoidance of alcohol. 2) Transmission discussed with patient including sexual transmission, sharing razors and toothbrush.   3) Will need referral to gastroenterology for obvious cirrhosis.  I will still do elastography since he will need that for medication approval.  With ascites, he is considered Child Pugh B.  Can use Harvoni, would avoid Albert LandauViekira Pak and [name].   4) Will need referral for substance abuse counseling: Yes.   5) Will prescribe Harvoni with 600 mg ribavirin for 12 weeks once he is in counseling and remains alcohol and drug free.  He can then sign the  readiness form.   7) Hepatitis B vaccine Yes.   8) Pneumovax vaccine next visit.  9) will follow up in 2 month and will consider treatment then.

## 2014-06-09 ENCOUNTER — Ambulatory Visit: Payer: Medicaid Other | Admitting: *Deleted

## 2014-06-09 ENCOUNTER — Telehealth: Payer: Self-pay | Admitting: Internal Medicine

## 2014-06-09 ENCOUNTER — Ambulatory Visit: Payer: Self-pay | Admitting: Physician Assistant

## 2014-06-09 DIAGNOSIS — F1410 Cocaine abuse, uncomplicated: Secondary | ICD-10-CM

## 2014-06-09 DIAGNOSIS — F1010 Alcohol abuse, uncomplicated: Secondary | ICD-10-CM

## 2014-06-10 NOTE — BH Specialist Note (Signed)
Albert Salazar was present for his scheduled appointment with counselor today for the first time.  Client was oriented times four with good affect and dress.  Client indicated at the beginning of session that he was stressed out and drained as his live in girlfriend of 12 years was in the hospital across the street and he had not been home since Monday.  Client said that he has little access to transportation and this is a problem area in his life that causes unnecessary stress.  Client shared that his life was negative all the time because his girlfriend was always fussing and complaining about something.  Client stated that her medical problems and her attitude was driving him crazy and that this caused him to use drugs and alcohol to escape.  Counselor talked with client about the role that drugs and alcohol play in his life.  Client admitted to drinking excessively and smoking crack.  Counselor educated client on addiction and using for for coping purposes. Client communicated that he knows he should not be using drugs and alcohol to cope but was in a desperate place right now. Client inquired about outpatient treatment and counselor made the necessary referral for client to [facility].  Client was scheduled for an initial appointment next Tuesday at 11.  Counselor encouraged client to follow through and get the help he needs right now in his life. Another appointment with counselor was made for next week.   Albert Salazar, LPCA, MA [facility]

## 2014-06-13 ENCOUNTER — Encounter (HOSPITAL_COMMUNITY): Payer: Self-pay | Admitting: Emergency Medicine

## 2014-06-13 ENCOUNTER — Emergency Department (HOSPITAL_COMMUNITY): Payer: Medicaid Other

## 2014-06-13 ENCOUNTER — Observation Stay (HOSPITAL_COMMUNITY)
Admission: EM | Admit: 2014-06-13 | Discharge: 2014-06-15 | Disposition: A | Discharge status: Home / Self Care | Payer: Medicaid Other | Attending: Oncology | Admitting: Oncology

## 2014-06-13 ENCOUNTER — Encounter (HOSPITAL_COMMUNITY): Payer: Self-pay | Admitting: *Deleted

## 2014-06-13 ENCOUNTER — Emergency Department (INDEPENDENT_AMBULATORY_CARE_PROVIDER_SITE_OTHER)
Admission: EM | Admit: 2014-06-13 | Discharge: 2014-06-13 | Disposition: A | Discharge status: Other Acute Inpatient Hospital | Payer: Medicaid Other | Source: Home / Self Care | Attending: Family Medicine | Admitting: Family Medicine

## 2014-06-13 DIAGNOSIS — F10129 Alcohol abuse with intoxication, unspecified: Secondary | ICD-10-CM | POA: Diagnosis not present

## 2014-06-13 DIAGNOSIS — R600 Localized edema: Secondary | ICD-10-CM | POA: Diagnosis not present

## 2014-06-13 DIAGNOSIS — B182 Chronic viral hepatitis C: Secondary | ICD-10-CM | POA: Diagnosis not present

## 2014-06-13 DIAGNOSIS — F10229 Alcohol dependence with intoxication, unspecified: Secondary | ICD-10-CM

## 2014-06-13 DIAGNOSIS — D696 Thrombocytopenia, unspecified: Secondary | ICD-10-CM | POA: Diagnosis present

## 2014-06-13 DIAGNOSIS — F17210 Nicotine dependence, cigarettes, uncomplicated: Secondary | ICD-10-CM | POA: Insufficient documentation

## 2014-06-13 DIAGNOSIS — B9689 Other specified bacterial agents as the cause of diseases classified elsewhere: Secondary | ICD-10-CM | POA: Diagnosis not present

## 2014-06-13 DIAGNOSIS — F319 Bipolar disorder, unspecified: Secondary | ICD-10-CM | POA: Diagnosis not present

## 2014-06-13 DIAGNOSIS — R300 Dysuria: Secondary | ICD-10-CM | POA: Diagnosis present

## 2014-06-13 DIAGNOSIS — N451 Epididymitis: Secondary | ICD-10-CM | POA: Insufficient documentation

## 2014-06-13 DIAGNOSIS — D649 Anemia, unspecified: Secondary | ICD-10-CM | POA: Diagnosis not present

## 2014-06-13 DIAGNOSIS — F10929 Alcohol use, unspecified with intoxication, unspecified: Secondary | ICD-10-CM | POA: Diagnosis present

## 2014-06-13 DIAGNOSIS — K7031 Alcoholic cirrhosis of liver with ascites: Secondary | ICD-10-CM

## 2014-06-13 DIAGNOSIS — F1010 Alcohol abuse, uncomplicated: Secondary | ICD-10-CM

## 2014-06-13 DIAGNOSIS — F1020 Alcohol dependence, uncomplicated: Secondary | ICD-10-CM | POA: Diagnosis present

## 2014-06-13 DIAGNOSIS — R609 Edema, unspecified: Secondary | ICD-10-CM | POA: Diagnosis present

## 2014-06-13 DIAGNOSIS — K7460 Unspecified cirrhosis of liver: Secondary | ICD-10-CM | POA: Diagnosis present

## 2014-06-13 DIAGNOSIS — N4400 Torsion of testis, unspecified: Secondary | ICD-10-CM

## 2014-06-13 DIAGNOSIS — R188 Other ascites: Secondary | ICD-10-CM

## 2014-06-13 HISTORY — DX: Major depressive disorder, single episode, unspecified: F32.9

## 2014-06-13 HISTORY — DX: Depression, unspecified: F32.A

## 2014-06-13 HISTORY — DX: Unspecified cirrhosis of liver: K74.60

## 2014-06-13 HISTORY — DX: Other psychoactive substance abuse, uncomplicated: F19.10

## 2014-06-13 HISTORY — DX: Bipolar disorder, unspecified: F31.9

## 2014-06-13 LAB — COMPREHENSIVE METABOLIC PANEL
ALBUMIN: 2.4 g/dL — AB (ref 3.5–5.0)
ALT: 72 U/L — ABNORMAL HIGH (ref 17–63)
AST: 125 U/L — ABNORMAL HIGH (ref 15–41)
Alkaline Phosphatase: 226 U/L — ABNORMAL HIGH (ref 38–126)
Anion gap: 8 (ref 5–15)
BUN: 7 mg/dL (ref 6–20)
CO2: 27 mmol/L (ref 22–32)
Calcium: 8 mg/dL — ABNORMAL LOW (ref 8.9–10.3)
Chloride: 101 mmol/L (ref 101–111)
Creatinine, Ser: 0.77 mg/dL (ref 0.61–1.24)
GFR calc non Af Amer: >60 mL/min (ref >60)
Glucose, Bld: 107 mg/dL — ABNORMAL HIGH (ref 65–99)
POTASSIUM: 3.5 mmol/L (ref 3.5–5.1)
Sodium: 136 mmol/L (ref 135–145)
Total Bilirubin: 0.8 mg/dL (ref 0.3–1.2)
Total Protein: 7.3 g/dL (ref 6.5–8.1)

## 2014-06-13 LAB — CBC WITH DIFFERENTIAL/PLATELET
BASOS ABS: 0 10*3/uL (ref 0.0–0.1)
Basophils Relative: 1 % (ref 0–1)
EOS ABS: 0 10*3/uL (ref 0.0–0.7)
Eosinophils Relative: 0 % (ref 0–5)
HEMATOCRIT: 33 % — AB (ref 39.0–52.0)
Hemoglobin: 11 g/dL — ABNORMAL LOW (ref 13.0–17.0)
LYMPHS PCT: 47 % — AB (ref 12–46)
Lymphs Abs: 2.3 10*3/uL (ref 0.7–4.0)
MCH: 32.7 pg (ref 26.0–34.0)
MCHC: 33.3 g/dL (ref 30.0–36.0)
MCV: 98.2 fL (ref 78.0–100.0)
MONO ABS: 0.5 10*3/uL (ref 0.1–1.0)
MONOS PCT: 11 % (ref 3–12)
Neutro Abs: 2 10*3/uL (ref 1.7–7.7)
Neutrophils Relative %: 41 % — ABNORMAL LOW (ref 43–77)
PLATELETS: 40 10*3/uL — AB (ref 150–400)
RBC: 3.36 MIL/uL — ABNORMAL LOW (ref 4.22–5.81)
RDW: 15.6 % — AB (ref 11.5–15.5)
WBC: 4.9 10*3/uL (ref 4.0–10.5)

## 2014-06-13 LAB — RAPID URINE DRUG SCREEN, HOSP PERFORMED
Amphetamines: NOT DETECTED
BARBITURATES: NOT DETECTED
Benzodiazepines: NOT DETECTED
Cocaine: POSITIVE — AB
Opiates: NOT DETECTED
Tetrahydrocannabinol: NOT DETECTED

## 2014-06-13 LAB — URINALYSIS, ROUTINE W REFLEX MICROSCOPIC
Glucose, UA: NEGATIVE mg/dL
Hgb urine dipstick: NEGATIVE
KETONES UR: 15 mg/dL — AB
LEUKOCYTES UA: NEGATIVE
NITRITE: NEGATIVE
Protein, ur: NEGATIVE mg/dL
SPECIFIC GRAVITY, URINE: 1.026 (ref 1.005–1.030)
UROBILINOGEN UA: 1 mg/dL (ref 0.0–1.0)
pH: 5.5 (ref 5.0–8.0)

## 2014-06-13 LAB — PROTIME-INR
INR: 1.45 (ref 0.00–1.49)
Prothrombin Time: 17.8 seconds — ABNORMAL HIGH (ref 11.6–15.2)

## 2014-06-13 LAB — ETHANOL: Alcohol, Ethyl (B): 159 mg/dL — ABNORMAL HIGH (ref <5)

## 2014-06-13 LAB — AMMONIA: AMMONIA: 19 umol/L (ref 9–35)

## 2014-06-13 LAB — BRAIN NATRIURETIC PEPTIDE: B NATRIURETIC PEPTIDE 5: 74.5 pg/mL (ref 0.0–100.0)

## 2014-06-13 MED ORDER — DOXYCYCLINE HYCLATE 100 MG PO TABS
100.0000 mg | ORAL_TABLET | Freq: Two times a day (BID) | ORAL | Status: DC
  Administered 2014-06-13 – 2014-06-15 (×4): 100 mg via ORAL
  Filled 2014-06-13 (×4): qty 1

## 2014-06-13 MED ORDER — SODIUM CHLORIDE 0.9 % IJ SOLN
3.0000 mL | Freq: Two times a day (BID) | INTRAMUSCULAR | Status: DC
  Administered 2014-06-13 – 2014-06-14 (×3): 3 mL via INTRAVENOUS

## 2014-06-13 MED ORDER — LORAZEPAM 1 MG PO TABS: 0.0000 mg | ORAL_TABLET | Freq: Two times a day (BID) | ORAL | Status: DC

## 2014-06-13 MED ORDER — CEFTRIAXONE SODIUM 250 MG IJ SOLR: 250.0000 mg | Freq: Once | INTRAMUSCULAR | Status: DC

## 2014-06-13 MED ORDER — LORAZEPAM 1 MG PO TABS: 1.0000 mg | ORAL_TABLET | Freq: Four times a day (QID) | ORAL | Status: DC | PRN

## 2014-06-13 MED ORDER — LORAZEPAM 2 MG/ML IJ SOLN
0.0000 mg | Freq: Four times a day (QID) | INTRAMUSCULAR | Status: DC
  Administered 2014-06-13: 1 mg via INTRAVENOUS
  Filled 2014-06-13: qty 1

## 2014-06-13 MED ORDER — SODIUM CHLORIDE 0.9 % IV SOLN: 250.0000 mL | INTRAVENOUS | Status: DC | PRN

## 2014-06-13 MED ORDER — ADULT MULTIVITAMIN W/MINERALS CH
1.0000 | ORAL_TABLET | Freq: Every day | ORAL | Status: DC
  Administered 2014-06-13 – 2014-06-15 (×3): 1 via ORAL
  Filled 2014-06-13 (×3): qty 1

## 2014-06-13 MED ORDER — FOLIC ACID 1 MG PO TABS
1.0000 mg | ORAL_TABLET | Freq: Every day | ORAL | Status: DC
  Administered 2014-06-13 – 2014-06-15 (×3): 1 mg via ORAL
  Filled 2014-06-13 (×3): qty 1

## 2014-06-13 MED ORDER — THIAMINE HCL 100 MG/ML IJ SOLN
100.0000 mg | Freq: Every day | INTRAMUSCULAR | Status: DC
  Filled 2014-06-13: qty 2

## 2014-06-13 MED ORDER — SODIUM CHLORIDE 0.9 % IJ SOLN: 3.0000 mL | INTRAMUSCULAR | Status: DC | PRN

## 2014-06-13 MED ORDER — SPIRONOLACTONE 50 MG PO TABS
50.0000 mg | ORAL_TABLET | Freq: Every day | ORAL | Status: DC
  Administered 2014-06-13 – 2014-06-14 (×2): 50 mg via ORAL
  Filled 2014-06-13 (×2): qty 1

## 2014-06-13 MED ORDER — VITAMIN B-1 100 MG PO TABS: 100.0000 mg | ORAL_TABLET | Freq: Every day | ORAL | Status: DC

## 2014-06-13 MED ORDER — CEFTRIAXONE SODIUM 250 MG IJ SOLR
250.0000 mg | Freq: Once | INTRAMUSCULAR | Status: AC
  Administered 2014-06-13: 250 mg via INTRAMUSCULAR
  Filled 2014-06-13: qty 250

## 2014-06-13 MED ORDER — FUROSEMIDE 20 MG PO TABS
20.0000 mg | ORAL_TABLET | Freq: Every day | ORAL | Status: DC
  Administered 2014-06-13 – 2014-06-14 (×2): 20 mg via ORAL
  Filled 2014-06-13 (×2): qty 1

## 2014-06-13 MED ORDER — THIAMINE HCL 100 MG/ML IJ SOLN
100.0000 mg | Freq: Every day | INTRAMUSCULAR | Status: DC
  Administered 2014-06-13: 100 mg via INTRAVENOUS
  Filled 2014-06-13: qty 2

## 2014-06-13 MED ORDER — LORAZEPAM 1 MG PO TABS: 0.0000 mg | ORAL_TABLET | Freq: Four times a day (QID) | ORAL | Status: DC

## 2014-06-13 MED ORDER — LORAZEPAM 2 MG/ML IJ SOLN: 1.0000 mg | Freq: Four times a day (QID) | INTRAMUSCULAR | Status: DC | PRN

## 2014-06-13 MED ORDER — LORAZEPAM 2 MG/ML IJ SOLN: 0.0000 mg | Freq: Two times a day (BID) | INTRAMUSCULAR | Status: DC

## 2014-06-13 MED ORDER — LORAZEPAM 2 MG/ML IJ SOLN
0.0000 mg | Freq: Two times a day (BID) | INTRAMUSCULAR | Status: DC
Start: 2014-06-13 — End: 2014-06-13

## 2014-06-13 MED ORDER — VITAMIN B-1 100 MG PO TABS
100.0000 mg | ORAL_TABLET | Freq: Every day | ORAL | Status: DC
  Administered 2014-06-13 – 2014-06-15 (×3): 100 mg via ORAL
  Filled 2014-06-13 (×3): qty 1

## 2014-06-13 MED ORDER — FUROSEMIDE 20 MG PO TABS: 20.0000 mg | ORAL_TABLET | Freq: Every day | ORAL | Status: DC

## 2014-06-13 MED ORDER — TETANUS-DIPHTH-ACELL PERTUSSIS 5-2.5-18.5 LF-MCG/0.5 IM SUSP
INTRAMUSCULAR | Status: AC
  Filled 2014-06-13: qty 0.5

## 2014-06-13 NOTE — ED Notes (Signed)
Pt has   cirrosis     He  Has  A  Distended        abd               As  Well as  Penile  swelling And  Scrotal  Swelling    That  Became  Worse  yest    Pt  Legs  Are  edematouus         He  adnits  To  Recent  etoh  Consumption

## 2014-06-13 NOTE — ED Provider Notes (Addendum)
CSN: [id number]     Arrival date & time 06/13/14  [id number] History   First MD Initiated Contact with Patient 06/13/14 1045     Chief Complaint  Patient presents with  . Bloated   (Consider location/radiation/quality/duration/timing/severity/associated sxs/prior Treatment) Patient is a 59 y.o. male presenting with general illness. The history is provided by the patient.  Illness Severity:  Severe Onset quality:  Gradual Duration:  3 days Progression:  Worsening Chronicity:  Chronic Context:  Known hepatitis and cirrhosis and etoh abuse, end stage. Associated symptoms: abdominal pain, fatigue and nausea     Past Medical History  Diagnosis Date  . Blindness of left eye   . Hepatitis C   . Cirrhosis of liver   . Polysubstance abuse    Past Surgical History  Procedure Laterality Date  . Eye surgery     Family History  Problem Relation Age of Onset  . Diabetes Brother    History  Substance Use Topics  . Smoking status: Current Every Day Smoker -- 1.00 packs/day for 40 years    Types: Cigarettes  . Smokeless tobacco: Never Used  . Alcohol Use: 4.8 oz/week    8 Cans of beer, 0 Standard drinks or equivalent per week    Review of Systems  Constitutional: Positive for activity change, appetite change and fatigue.  Cardiovascular: Positive for leg swelling.  Gastrointestinal: Positive for nausea, abdominal pain and abdominal distention.  Genitourinary: Positive for scrotal swelling.    Allergies  Review of patient's allergies indicates no known allergies.  Home Medications   Prior to Admission medications   Medication Sig Start Date End Date Taking? Authorizing Provider  carbamide peroxide (DEBROX) 6.5 % otic solution Place 5 drops into both ears every 30 (thirty) days. Patient not taking: Reported on 06/13/2014 12/30/13   Maurice MarchShelly S Eisbach, NP  FLUoxetine (PROZAC) 10 MG capsule Take 1 capsule (10 mg total) by mouth daily. 12/30/13   Maurice MarchShelly S Eisbach, NP  ibuprofen  (ADVIL,MOTRIN) 200 MG tablet Take 2 tablets (400 mg total) by mouth every 6 (six) hours as needed for moderate pain. 12/30/13   Maurice MarchShelly S Eisbach, NP  Tetrahydrozoline HCl (VISINE EXTRA OP) Apply 1-2 drops to eye daily as needed (redness.).    Historical Provider, MD   BP 133/80 mmHg  Pulse 92  Temp(Src) 98.3 F (36.8 C) (Oral)  Resp 16  SpO2 100% Physical Exam  Constitutional: He appears lethargic. He appears cachectic. He appears ill.  Abdominal: He exhibits distension.  Ascites.  Genitourinary:    Right testis shows swelling. Left testis shows swelling. Circumcised.  Neurological: He appears lethargic.  Skin: Skin is warm and dry.  Jaundiced   Nursing note and vitals reviewed.   ED Course  Procedures (including critical care time) Labs Review Labs Reviewed - No data to display  Imaging Review [place] Abdomen Complete  06/13/2014   CLINICAL DATA:  Cirrhosis.  Abdominal swelling.  EXAM: ULTRASOUND ABDOMEN COMPLETE  COMPARISON:  None.  FINDINGS: Gallbladder: There is gallbladder wall thickening but no evidence of stones and no Murphy's sign. The wall measures up to 6 mm in thickness.  Common bile duct: Diameter: 3 mm  Liver: The liver is diffusely heterogeneous but there is no focal mass. The caudate lobe and left lobe are somewhat prominent consistent with the history of cirrhosis.  IVC: No abnormality visualized.  Pancreas: Visualized portion unremarkable.  Spleen: Size and appearance within normal limits.  Right Kidney: Length: 11.4 cm. Echogenicity within normal limits. No mass  or hydronephrosis visualized.  Left Kidney: Length: 11.0 cm. Echogenicity within normal limits. No mass or hydronephrosis visualized.  Abdominal aorta: Portions of the aorta were obscured. Maximal visualized caliber was 2.4 cm.  Other findings: Small amount of ascites is present.  IMPRESSION: Cirrhotic liver.  Small amount of ascites.  The liver is heterogeneous without focal mass.  Nonspecific gallbladder wall  thickening.   Electronically Signed   By: Jolaine ClickArthur  Hoss M.D.   On: 06/13/2014 14:07   [place] Scrotum  06/13/2014   CLINICAL DATA:  Testicular swelling and pain since yesterday. Pain on both sides. Concern for testicular torsion.  EXAM: SCROTAL ULTRASOUND  DOPPLER ULTRASOUND OF THE TESTICLES  TECHNIQUE: Complete ultrasound examination of the testicles, epididymis, and other scrotal structures was performed. Color and spectral Doppler ultrasound were also utilized to evaluate blood flow to the testicles.  COMPARISON:  None.  FINDINGS: Right testicle  Measurements: Normal in size and homogeneous echotexture measuring 3.9 x 2.4 x 2.3 cm. Normal color Doppler flow and spectral waveforms.  Left testicle  Measurements: Normal in size and homogeneous in echotexture measuring 3.9 x 2.5 x 2.8 cm. Normal color Doppler flow and spectral waveforms.  Right epididymis:  No abnormality is seen  Left epididymis: There is increased vascularity and mild enlargement of the tail of the left epididymis.  Hydrocele:  Bilateral small hydroceles which are anechoic  Varicocele:  None visualized.  Pulsed Doppler interrogation of both testes demonstrates normal low resistance arterial and venous waveforms bilaterally.  Bilateral scrotal edema  IMPRESSION: 1. Normal testicles with no evidence of ovarian torsion or orchitis. 2. Heterogeneous and hypervascular left epididymis is most consistent with epididymitis. 3. Bilateral scrotal thickening and edema likely related to epididymitis. 4. Bilateral small hydroceles.   Electronically Signed   By: Genevive BiStewart  Edmunds M.D.   On: 06/13/2014 13:51   [place] Art/ven Flow Abd Pelv Doppler  06/13/2014   : CLINICAL DATA: Testicular swelling and pain since yesterday. Pain on both sides. Concern for testicular torsion.  EXAM: SCROTAL ULTRASOUND  DOPPLER ULTRASOUND OF THE TESTICLES  TECHNIQUE: Complete ultrasound examination of the testicles, epididymis, and other scrotal structures was performed. Color and  spectral Doppler ultrasound were also utilized to evaluate blood flow to the testicles.  COMPARISON: None.  FINDINGS: Right testicle  Measurements: Normal in size and homogeneous echotexture measuring 3.9 x 2.4 x 2.3 cm. Normal color Doppler flow and spectral waveforms.  Left testicle  Measurements: Normal in size and homogeneous in echotexture measuring 3.9 x 2.5 x 2.8 cm. Normal color Doppler flow and spectral waveforms.  Right epididymis: No abnormality is seen  Left epididymis: There is increased vascularity and mild enlargement of the tail of the left epididymis.  Hydrocele: Bilateral small hydroceles which are anechoic  Varicocele: None visualized.  Pulsed Doppler interrogation of both testes demonstrates normal low resistance arterial and venous waveforms bilaterally.  Bilateral scrotal edema  IMPRESSION: 1. Normal testicles with no evidence of ovarian torsion or orchitis. 2. Heterogeneous and hypervascular left epididymis is most consistent with epididymitis. 3. Bilateral scrotal thickening and edema likely related to epididymitis. 4. Bilateral small hydroceles.   Electronically Signed   By: Genevive BiStewart  Edmunds M.D.   On: 06/13/2014 15:09     MDM   1. Ascites due to alcoholic cirrhosis    Sent for eval of ascites and liver failure.Linna Hoff.   Danzell Birky D Czarina Gingras, MD 06/13/14 1102  Linna HoffJames D Desa Rech, MD 06/13/14 [phone]

## 2014-06-13 NOTE — ED Notes (Signed)
Pt. Transferred from [facility] with a swollen penis since yesterday. Sent to ED for further evaluation. History of ascites from cirrhosis of liver

## 2014-06-13 NOTE — ED Notes (Signed)
Report attempted 

## 2014-06-13 NOTE — ED Provider Notes (Signed)
CSN: [id number]     Arrival date & time 06/13/14  1130 History   First MD Initiated Contact with Patient 06/13/14 1143     Chief Complaint  Patient presents with  . Groin Swelling    and legs swelling  . Edema    abd swelling      HPI  Pt was seen at 1145. Per pt, c/o gradual onset and worsening of persistent bilat LE's and abd "swelling" for the past 3 days, worse since yesterday. Pt states his penis and scrotum "started swelling yesterday."  Pt endorses hx cirrhosis of the liver "because of my drinking." LD etoh use yesterday, per pt, and states he "feels a little shaky" today. Pt was evaluated at [facility], then sent to the ED for further evaluation and admission. Denies CP/palpitations, no SOB/cough, no abd pain, no N/V/D, no scrotal pain, no fevers, no rash.    Past Medical History  Diagnosis Date  . Blindness of left eye   . Hepatitis C   . Cirrhosis of liver   . Polysubstance abuse    Past Surgical History  Procedure Laterality Date  . Eye surgery     Family History  Problem Relation Age of Onset  . Diabetes Brother    History  Substance Use Topics  . Smoking status: Current Every Day Smoker -- 0.50 packs/day for 40 years  . Smokeless tobacco: Never Used  . Alcohol Use: 4.8 oz/week    8 Cans of beer, 0 Standard drinks or equivalent per week    Review of Systems ROS: Statement: All systems negative except as marked or noted in the HPI; Constitutional: Negative for fever and chills. ; ; Eyes: Negative for eye pain, redness and discharge. ; ; ENMT: Negative for ear pain, hoarseness, nasal congestion, sinus pressure and sore throat. ; ; Cardiovascular: Negative for chest pain, palpitations, diaphoresis, dyspnea and +peripheral edema. ; ; Respiratory: Negative for cough, wheezing and stridor. ; ; Gastrointestinal: +"abd swelling." Negative for nausea, vomiting, diarrhea, abdominal pain, blood in stool, hematemesis, jaundice and rectal bleeding. . ; ; Genitourinary: Negative  for dysuria, flank pain and hematuria. ; ; Musculoskeletal: Negative for back pain and neck pain. Negative for swelling and trauma.; ; Skin: Negative for pruritus, rash, abrasions, blisters, bruising and skin lesion.; ; Neuro: Negative for headache, lightheadedness and neck stiffness. Negative for weakness, altered level of consciousness , altered mental status, extremity weakness, paresthesias, involuntary movement, seizure and syncope.     Allergies  Review of patient's allergies indicates no known allergies.  Home Medications   Prior to Admission medications   Medication Sig Start Date End Date Taking? Authorizing Provider  acetaminophen (TYLENOL) 500 MG tablet Take 1,000 mg by mouth every 4 (four) hours as needed for moderate pain or headache.    Historical Provider, MD  carbamide peroxide (DEBROX) 6.5 % otic solution Place 5 drops into both ears every 30 (thirty) days. 12/30/13   Maurice March, NP  FLUoxetine (PROZAC) 10 MG capsule Take 1 capsule (10 mg total) by mouth daily. 12/30/13   Maurice March, NP  ibuprofen (ADVIL,MOTRIN) 200 MG tablet Take 2 tablets (400 mg total) by mouth every 6 (six) hours as needed for moderate pain. 12/30/13   Maurice March, NP  Tetrahydrozoline HCl (VISINE EXTRA OP) Apply 1-2 drops to eye daily as needed (redness.).    Historical Provider, MD   BP 157/95 mmHg  Pulse 93  Temp(Src) 98.2 F (36.8 C) (Oral)  Resp 17  Ht  (1.753 m)  Wt 173 lb 3 oz (78.557 kg)  BMI 25.56 kg/m2  SpO2 94% Physical Exam  1150: Physical examination:  Nursing notes reviewed; Vital signs and O2 SAT reviewed;  Constitutional: Well developed, Well nourished, Well hydrated, In no acute distress; Head:  Normocephalic, atraumatic; Eyes: EOMI, PERRL, No scleral icterus; ENMT: Mouth and pharynx normal, Mucous membranes moist; Neck: Supple, Full range of motion, No lymphadenopathy; Cardiovascular: Regular rate and rhythm, No gallop; Respiratory: Breath sounds clear & equal  bilaterally, No wheezes.  Speaking full sentences with ease, Normal respiratory effort/excursion; Chest: Nontender, Movement normal; Abdomen: Soft, Nontender. +tense edema. Normal bowel sounds; Genitourinary: No CVA tenderness. Genital exam performed with pt permission and ED RN chaperone present during exam. No perineal erythema. +penile and scrotal edema.  No penile erythema, lesions or drainage.  No scrotal erythema, lesions, or tenderness to palp.  Normal testicular lie.  No testicular tenderness to palp.  +cremasteric reflexes bilat.  No inguinal LAN or palpable masses.; Extremities: Pulses normal, No tenderness, +3 bilat LE's edema from feet to proximal thighs. No calf tenderness or asymmetry.; Neuro: AA&Ox3, +blind left eye, otherwise major CN grossly intact.  Speech clear. No gross focal motor deficits in extremities. Climbs on and off stretcher easily by himself. Gait steady. +mild hands tremor.; Skin: Color normal, Warm, Dry.   ED Course  Procedures     EKG Interpretation None      MDM  MDM Reviewed: previous chart, nursing note and vitals Reviewed previous: labs Interpretation: labs and ultrasound     Results for orders placed or performed during the hospital encounter of 06/13/14  Comprehensive metabolic panel  Result Value Ref Range   Sodium 136 135 - 145 mmol/L   Potassium 3.5 3.5 - 5.1 mmol/L   Chloride 101 101 - 111 mmol/L   CO2 27 22 - 32 mmol/L   Glucose, Bld 107 (H) 65 - 99 mg/dL   BUN 7 6 - 20 mg/dL   Creatinine, Ser 9.60 0.61 - 1.24 mg/dL   Calcium 8.0 (L) 8.9 - 10.3 mg/dL   Total Protein 7.3 6.5 - 8.1 g/dL   Albumin 2.4 (L) 3.5 - 5.0 g/dL   AST 454 (H) 15 - 41 U/L   ALT 72 (H) 17 - 63 U/L   Alkaline Phosphatase 226 (H) 38 - 126 U/L   Total Bilirubin 0.8 0.3 - 1.2 mg/dL   GFR calc non Af Amer >60 >60 mL/min   GFR calc Af Amer >60 >60 mL/min   Anion gap 8 5 - 15  Ethanol  Result Value Ref Range   Alcohol, Ethyl (B) 159 (H) <5 mg/dL  CBC with Differential   Result Value Ref Range   WBC 4.9 4.0 - 10.5 K/uL   RBC 3.36 (L) 4.22 - 5.81 MIL/uL   Hemoglobin 11.0 (L) 13.0 - 17.0 g/dL   HCT 09.8 (L) 11.9 - 14.7 %   MCV 98.2 78.0 - 100.0 fL   MCH 32.7 26.0 - 34.0 pg   MCHC 33.3 30.0 - 36.0 g/dL   RDW 82.9 (H) 56.2 - 13.0 %   Platelets 40 (L) 150 - 400 K/uL   Neutrophils Relative % 41 (L) 43 - 77 %   Neutro Abs 2.0 1.7 - 7.7 K/uL   Lymphocytes Relative 47 (H) 12 - 46 %   Lymphs Abs 2.3 0.7 - 4.0 K/uL   Monocytes Relative 11 3 - 12 %   Monocytes Absolute 0.5 0.1 - 1.0 K/uL  Eosinophils Relative 0 0 - 5 %   Eosinophils Absolute 0.0 0.0 - 0.7 K/uL   Basophils Relative 1 0 - 1 %   Basophils Absolute 0.0 0.0 - 0.1 K/uL  Protime-INR  Result Value Ref Range   Prothrombin Time 17.8 (H) 11.6 - 15.2 seconds   INR 1.45 0.00 - 1.49  Ammonia  Result Value Ref Range   Ammonia 19 9 - 35 umol/L   [place] Abdomen Complete 06/13/2014   CLINICAL DATA:  Cirrhosis.  Abdominal swelling.  EXAM: ULTRASOUND ABDOMEN COMPLETE  COMPARISON:  None.  FINDINGS: Gallbladder: There is gallbladder wall thickening but no evidence of stones and no Murphy's sign. The wall measures up to 6 mm in thickness.  Common bile duct: Diameter: 3 mm  Liver: The liver is diffusely heterogeneous but there is no focal mass. The caudate lobe and left lobe are somewhat prominent consistent with the history of cirrhosis.  IVC: No abnormality visualized.  Pancreas: Visualized portion unremarkable.  Spleen: Size and appearance within normal limits.  Right Kidney: Length: 11.4 cm. Echogenicity within normal limits. No mass or hydronephrosis visualized.  Left Kidney: Length: 11.0 cm. Echogenicity within normal limits. No mass or hydronephrosis visualized.  Abdominal aorta: Portions of the aorta were obscured. Maximal visualized caliber was 2.4 cm.  Other findings: Small amount of ascites is present.  IMPRESSION: Cirrhotic liver.  Small amount of ascites.  The liver is heterogeneous without focal mass.   Nonspecific gallbladder wall thickening.   Electronically Signed   By: Jolaine ClickArthur  Hoss M.D.   On: 06/13/2014 14:07   [place] Scrotum 06/13/2014   CLINICAL DATA:  Testicular swelling and pain since yesterday. Pain on both sides. Concern for testicular torsion.  EXAM: SCROTAL ULTRASOUND  DOPPLER ULTRASOUND OF THE TESTICLES  TECHNIQUE: Complete ultrasound examination of the testicles, epididymis, and other scrotal structures was performed. Color and spectral Doppler ultrasound were also utilized to evaluate blood flow to the testicles.  COMPARISON:  None.  FINDINGS: Right testicle  Measurements: Normal in size and homogeneous echotexture measuring 3.9 x 2.4 x 2.3 cm. Normal color Doppler flow and spectral waveforms.  Left testicle  Measurements: Normal in size and homogeneous in echotexture measuring 3.9 x 2.5 x 2.8 cm. Normal color Doppler flow and spectral waveforms.  Right epididymis:  No abnormality is seen  Left epididymis: There is increased vascularity and mild enlargement of the tail of the left epididymis.  Hydrocele:  Bilateral small hydroceles which are anechoic  Varicocele:  None visualized.  Pulsed Doppler interrogation of both testes demonstrates normal low resistance arterial and venous waveforms bilaterally.  Bilateral scrotal edema  IMPRESSION: 1. Normal testicles with no evidence of ovarian torsion or orchitis. 2. Heterogeneous and hypervascular left epididymis is most consistent with epididymitis. 3. Bilateral scrotal thickening and edema likely related to epididymitis. 4. Bilateral small hydroceles.   Electronically Signed   By: Genevive BiStewart  Edmunds M.D.   On: 06/13/2014 13:51    Results for Harold HedgeMEBANE, Perry (MRN [id number]) as of 06/13/2014 14:22  Ref. Range 12/29/2013 17:14 04/21/2014 10:37 06/13/2014 12:04  AST Latest Ref Range: 15-41 U/L 90 (H) 61 (H) 125 (H)  ALT Latest Ref Range: 17-63 U/L 58 (H) 42 72 (H)   Results for [facility] Hospital[facility], Raymond (MRN [id number]) as of 06/13/2014 14:22  Ref. Range 04/21/2014 10:37  06/13/2014 12:04  Platelets Latest Ref Range: 150-400 K/uL 50 (L) 40 (L)    1430:  CIWA protocol started on pt's arrival to the ED. VS continue stable. Will need further  workup for significant peripheral edema. [facility], case discussed, including:  HPI, pertinent PM/SHx, VS/PE, dx testing, ED course and treatment:  Agreeable to admit, requests to obtain medical bed to Dr. Patsy LagerGranfortuna's service.  Samuel JesterKathleen Taylor Spilde, DO 06/16/14 1138

## 2014-06-13 NOTE — H&P (Signed)
Date: 06/13/2014               Patient Name:  Albert HedgeDavid Salazar MRN: [id number]  DOB: 1955/11/08 Age / Sex: 59 y.o., male   PCP: Doris Cheadleeepak Advani, MD              Medical Service: Internal Medicine Teaching Service              Attending Physician: Dr. Levert FeinsteinJames M Granfortuna, MD    First Contact: Laurena SpiesKhadijah Harjit Douds, MS4 Pager: [phone]  Second Contact: Dr. Carlynn PurlErik Hoffman Pager: [phone]            After Hours (After 5p/  First Contact Pager: [phone]  weekends / holidays): Second Contact Pager: [phone]   Chief Complaint: swelling  History of Present Illness: Albert Salazar is a 59 year old male with history of hepatitis C, cirrhosis, and alcohol use disorder who presents with cirrhosis of liver with ascites, left epididymitis, and acute alcohol intoxication. Patient reports a nagging pain and swelling around his genitals since yesterday morning. This morning, given the degree of swelling and pain, he went to urgent care and was sent to the ED. Patient also reports abdominal swelling for the last two weeks. Patient currently reports drinking 1 - 2 40 ounce beers daily, stating that he last drank 2 40 ounce beers yesterday night. He denies drinking this morning. Patient also reports using crack cocaine, most recently a day or two ago. Patient smokes one pack per day. Patient is sexually active with one male partner. He has not been recently tested for STDs, but states his partner likely has given her frequent hospital appointments. He says he may have had an STD in his youth, but is unable to provide further details. Patient endorses nausea, decreased appetite, sluggishness, intermittent palpitations, some diaphoresis, tremulousness, and longstanding dysuria (months - years). He denies headache, seizures, or auditory/ visual hallucinations. Patient states that he has had withdrawal 2 - 3 times in the past while incarcerated. Patient does not receive regular outpatient care and denies taking any outpatient  medications.  Meds: Current Facility-Administered Medications  Medication Dose Route Frequency Provider Last Rate Last Dose  . furosemide (LASIX) tablet 20 mg  20 mg Oral Daily Gust RungErik C Hoffman, DO      . LORazepam (ATIVAN) injection 0-4 mg  0-4 mg Intravenous 4 times per day Samuel JesterKathleen McManus, DO   1 mg at 06/13/14 1231  . [START ON 06/15/2014] LORazepam (ATIVAN) injection 0-4 mg  0-4 mg Intravenous Q12H Samuel JesterKathleen McManus, DO      . LORazepam (ATIVAN) tablet 0-4 mg  0-4 mg Oral 4 times per day Samuel JesterKathleen McManus, DO   0 mg at 06/13/14 1232  . [START ON 06/15/2014] LORazepam (ATIVAN) tablet 0-4 mg  0-4 mg Oral Q12H Samuel JesterKathleen McManus, DO      . spironolactone (ALDACTONE) tablet 50 mg  50 mg Oral Daily Gust RungErik C Hoffman, DO      . thiamine (B-1) injection 100 mg  100 mg Intravenous Daily Samuel JesterKathleen McManus, DO   100 mg at 06/13/14 1230  . thiamine (VITAMIN B-1) tablet 100 mg  100 mg Oral Daily Samuel JesterKathleen McManus, DO   100 mg at 06/13/14 1231   Current Outpatient Prescriptions  Medication Sig Dispense Refill  . FLUoxetine (PROZAC) 10 MG capsule Take 1 capsule (10 mg total) by mouth daily. 30 capsule 3  . ibuprofen (ADVIL,MOTRIN) 200 MG tablet Take 2 tablets (400 mg total) by mouth every 6 (six) hours as needed for moderate  pain. 30 tablet 0  . Tetrahydrozoline HCl (VISINE EXTRA OP) Apply 1-2 drops to eye daily as needed (redness.).    Marland Kitchen. carbamide peroxide (DEBROX) 6.5 % otic solution Place 5 drops into both ears every 30 (thirty) days. (Patient not taking: Reported on 06/13/2014) 15 mL     Allergies: Allergies as of 06/13/2014  . (No Known Allergies)   Past Medical History  Diagnosis Date  . Blindness of left eye   . Hepatitis C   . Cirrhosis of liver   . Polysubstance abuse    Past Surgical History  Procedure Laterality Date  . Eye surgery     Family History  Problem Relation Age of Onset  . Diabetes Brother    History   Social History  . Marital Status: Married    Spouse Name: N/A  .  Number of Children: N/A  . Years of Education: N/A   Occupational History  . Not on file.   Social History Main Topics  . Smoking status: Current Every Day Smoker -- 0.50 packs/day for 40 years  . Smokeless tobacco: Never Used  . Alcohol Use: 4.8 oz/week    8 Cans of beer, 0 Standard drinks or equivalent per week  . Drug Use: Yes    Special: IV, Cocaine  . Sexual Activity: Not on file   Other Topics Concern  . Not on file   Social History Narrative    Review of Systems: Pertinent items are noted in HPI.  Physical Exam: Blood pressure 157/95, pulse 93, temperature 98.2 F (36.8 C), temperature source Oral, resp. rate 17, height 5\' 9"  (1.753 m), weight 78.557 kg (173 lb 3 oz), SpO2 94 %. General appearance: semi-cooperative, sluggish, difficult to comprehend, lying in bed, NAD Head: Normocephalic, without obvious abnormality, atraumatic Eyes: conjunctivae clear. PERRL, EOM's intact. Left eye cloudy and deviated medially Lungs: clear to auscultation bilaterally, normal WOB Heart: regular rate and rhythm, S1, S2 normal, no murmur, click, rub or gallop Abdomen: tense, non-tender, mildly distended with ascitic fluid, mild caput medusae, positive BS Male genitalia: normal, penis: edematous with generalized tenderness, scrotum: edematous with generalized tenderness Extremities: extremities atraumatic, 2+ pitting edema in BLEs Skin: Skin color, texture, turgor normal. No rashes or lesions Neurologic: Grossly normal, alert and oriented x4 (person, place, date, circumstance)  Lab results: Basic Metabolic Panel:  Recent Labs  16/10/9603/15/16 1204  NA 136  K 3.5  CL 101  CO2 27  GLUCOSE 107*  BUN 7  CREATININE 0.77  CALCIUM 8.0*   Liver Function Tests:  Recent Labs  06/13/14 1204  AST 125*  ALT 72*  ALKPHOS 226*  BILITOT 0.8  PROT 7.3  ALBUMIN 2.4*   No results for input(s): LIPASE, AMYLASE in the last 72 hours.  Recent Labs  06/13/14 1204  AMMONIA 19    CBC:  Recent Labs  06/13/14 1204  WBC 4.9  NEUTROABS 2.0  HGB 11.0*  HCT 33.0*  MCV 98.2  PLT 40*   Coagulation:  Recent Labs  06/13/14 1204  LABPROT 17.8*  INR 1.45   Urine Drug Screen: Drugs of Abuse     Component Value Date/Time   LABOPIA NONE DETECTED 06/13/2014 1440   COCAINSCRNUR POSITIVE* 06/13/2014 1440   LABBENZ NONE DETECTED 06/13/2014 1440   AMPHETMU NONE DETECTED 06/13/2014 1440   THCU NONE DETECTED 06/13/2014 1440   LABBARB NONE DETECTED 06/13/2014 1440    Alcohol Level:  Recent Labs  06/13/14 1204  ETH 159*   Urinalysis:  Recent Labs  06/13/14 1440  COLORURINE AMBER*  LABSPEC 1.026  PHURINE 5.5  GLUCOSEU NEGATIVE  HGBUR NEGATIVE  BILIRUBINUR SMALL*  KETONESUR 15*  PROTEINUR NEGATIVE  UROBILINOGEN 1.0  NITRITE NEGATIVE  LEUKOCYTESUR NEGATIVE    Imaging results:  US Abdomen Complete  06/13/2014   CLINICAL DATA:  Cirrhosis.  Abdominal swelling.  EXAM: ULTRASOUND ABDOMEN COMPLETE  COMPARISON:  None.  FINDINGS: Gallbladder: There is gallbladder wall thickening but no evidence of stones and no Murphy's sign. The wall measures up to 6 mm in thickness.  Common bile duct: Diameter: 3 mm  Liver: The liver is diffusely heterogeneous but there is no focal mass. The caudate lobe and left lobe are somewhat prominent consistent with the history of cirrhosis.  IVC: No abnormality visualized.  Pancreas: Visualized portion unremarkable.  Spleen: Size and appearance within normal limits.  Right Kidney: Length: 11.4 cm. Echogenicity within normal limits. No mass or hydronephrosis visualized.  Left Kidney: Length: 11.0 cm. Echogenicity within normal limits. No mass or hydronephrosis visualized.  Abdominal aorta: Portions of the aorta were obscured. Maximal visualized caliber was 2.4 cm.  Other findings: Small amount of ascites is present.  IMPRESSION: Cirrhotic liver.  Small amount of ascites.  The liver is heterogeneous without focal mass.  Nonspecific  gallbladder wall thickening.   Electronically Signed   By: Jolaine Click M.D.   On: 06/13/2014 14:07   US Scrotum  06/13/2014   CLINICAL DATA:  Testicular swelling and pain since yesterday. Pain on both sides. Concern for testicular torsion.  EXAM: SCROTAL ULTRASOUND  DOPPLER ULTRASOUND OF THE TESTICLES  TECHNIQUE: Complete ultrasound examination of the testicles, epididymis, and other scrotal structures was performed. Color and spectral Doppler ultrasound were also utilized to evaluate blood flow to the testicles.  COMPARISON:  None.  FINDINGS: Right testicle  Measurements: Normal in size and homogeneous echotexture measuring 3.9 x 2.4 x 2.3 cm. Normal color Doppler flow and spectral waveforms.  Left testicle  Measurements: Normal in size and homogeneous in echotexture measuring 3.9 x 2.5 x 2.8 cm. Normal color Doppler flow and spectral waveforms.  Right epididymis:  No abnormality is seen  Left epididymis: There is increased vascularity and mild enlargement of the tail of the left epididymis.  Hydrocele:  Bilateral small hydroceles which are anechoic  Varicocele:  None visualized.  Pulsed Doppler interrogation of both testes demonstrates normal low resistance arterial and venous waveforms bilaterally.  Bilateral scrotal edema  IMPRESSION: 1. Normal testicles with no evidence of ovarian torsion or orchitis. 2. Heterogeneous and hypervascular left epididymis is most consistent with epididymitis. 3. Bilateral scrotal thickening and edema likely related to epididymitis. 4. Bilateral small hydroceles.   Electronically Signed   By: Genevive Bi M.D.   On: 06/13/2014 13:51   [place] Art/ven Flow Abd Pelv Doppler  06/13/2014   : CLINICAL DATA: Testicular swelling and pain since yesterday. Pain on both sides. Concern for testicular torsion.  EXAM: SCROTAL ULTRASOUND  DOPPLER ULTRASOUND OF THE TESTICLES  TECHNIQUE: Complete ultrasound examination of the testicles, epididymis, and other scrotal structures was performed.  Color and spectral Doppler ultrasound were also utilized to evaluate blood flow to the testicles.  COMPARISON: None.  FINDINGS: Right testicle  Measurements: Normal in size and homogeneous echotexture measuring 3.9 x 2.4 x 2.3 cm. Normal color Doppler flow and spectral waveforms.  Left testicle  Measurements: Normal in size and homogeneous in echotexture measuring 3.9 x 2.5 x 2.8 cm. Normal color Doppler flow and spectral waveforms.  Right epididymis: No abnormality  is seen  Left epididymis: There is increased vascularity and mild enlargement of the tail of the left epididymis.  Hydrocele: Bilateral small hydroceles which are anechoic  Varicocele: None visualized.  Pulsed Doppler interrogation of both testes demonstrates normal low resistance arterial and venous waveforms bilaterally.  Bilateral scrotal edema  IMPRESSION: 1. Normal testicles with no evidence of ovarian torsion or orchitis. 2. Heterogeneous and hypervascular left epididymis is most consistent with epididymitis. 3. Bilateral scrotal thickening and edema likely related to epididymitis. 4. Bilateral small hydroceles.   Electronically Signed   By: Genevive Bi M.D.   On: 06/13/2014 15:09     Assessment & Plan by Problem: Active Problems:   Alcohol use disorder, severe, dependence   Chronic hepatitis C without hepatic coma   Cirrhosis of liver with ascites   Thrombocytopenia   Alcohol intoxication   Normocytic anemia   Peripheral edema   Left epididymitis   Dysuria   Dirk Vanaman is a 59 year old male with history of hepatitis C, cirrhosis, and alcohol use disorder who presents with cirrhosis of liver with ascites, left epididymitis, and acute alcohol intoxication.  Cirrhosis of liver with ascites: Patient has a known history of cirrhosis and reports a small amount of ascites for the last two weeks, confirmed by ultrasound. Patient's thrombocytopenia (platelets: 40), low albumin (2.4), and INR are all consistent with cirrhosis.  Patient was last seen by Dr. Luciana Axe on 06/07/14 for initial evaluation of a positive Hepatitis C antibody test. Patient tested positive for Hepatitis C on 04/21/14. Patient is not currently receiving any treatment for his liver disease. Liver enzymes are not significantly elevated (AST: 125, ALT: 72, alkaline phosphatase: 226), but this could be a sign of advanced liver disease. Ammonia level: 19. - furosemide 20mg  PO daily - spironolactone 50mg  PO daily  Chronic hepatitis C: Patient was last seen by Dr. Luciana Axe on 06/07/14 for initial evaluation of a positive Hepatitis C antibody test. Patient tested positive for Hepatitis C on 04/21/14. Patient is not currently receiving any treatment for his liver disease.   Alcohol use disorder + acute alcohol intoxication: Patient presents with ethanol level of 159 and reports a last drink of 2 40 ounce beers yesterday night. Patient reports drinking 1 - 2 40 ounce beers daily and has a known prior history of alcohol withdrawal and DTs. UDS positive for cocaine. - CIWA - IV ativan per CIWA protocol - thiamine - folic acid - daily BMP  Normocytic Anemia: Hb: 11.0. MCV: 98.2. Possible anemia of chronic disease vs. anemia of chronic alcoholism. No signs of active bleeding. - continue to monitor  Thrombocytopenia: c/w cirrhosis. Platelets: 40 on admission compared to 50 on 04/21/14. No active signs of bleeding. - continue to monitor   Peripheral edema: 2+ pitting edema of bilateral lower extremities on presentation. Patient has no know history of CHF, though a workup for heart failure may be advisable. Will consider TTE. - f/u BNP  Left epididymitis: Patient presents with a one day history of genital pain and swelling. Ultrasound confirms left-sided epididymitis. - ceftriaxone 250mg  IM x1 dose - doxycycline 100mg  PO q12h x 10 days   Dysuria: Patient reports long-standing history of dysuria (months - years). Urinalysis shows no signs of urinary infection. HIV  antibody on 05/28/14 was non-reactive. - f/u RPR - f/u GC chlamydia - f/u urine culture  FEN/GI: DVT PPx: SCDs Diet: Low-sodium Code: Full  This is a Psychologist, occupational Note.  The care of the patient was discussed with  Dr. Carlynn Purl and the assessment and plan was formulated with their assistance.  Please see their note for official documentation of the patient encounter.   Signed: Laurena Spies, Med Student 06/13/2014, 3:34 PM

## 2014-06-13 NOTE — H&P (Signed)
Date: 06/13/2014               Patient Name:  Albert Salazar MRN: [id number]  DOB: 16-Feb-1955 Age / Sex: 59 y.o., male   PCP: Doris Cheadle, MD         Medical Service: Internal Medicine Teaching Service         Attending Physician: Dr. Levert Feinstein, MD    First Contact: Laurena Spies MS4 Pager: [phone]  Second Contact: Dr. 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  He was sent to the [facility] for further evaluation as he had significant scrotal swelling and lower extremity edema.  He notes the lower extremity edema has been getting worse over the past 2 weeks.  He is actively intoxicated and is a limited historian.  He does note that he has recently seen [facility] and also Dr. Luciana Axe from ID for his chronic Hep C.  Per notes of Dr Luciana Axe he was having abdominal swelling 1 week ago.  He does admit to ongoing ETOH use, usually 2 40oz beers a night but will also drink any liquor he can get ahold of.  Last drink was last night.  He reports his swollen scrotum brought him in today because it was uncomfortable to walk.   He reports he does have unprotected sex with a women he lives with, he denies other sexual partners or MSM.   Meds: Current Facility-Administered Medications  Medication Dose Route Frequency Provider Last Rate Last Dose  . furosemide (LASIX) tablet 20 mg  20 mg Oral Daily Gust Rung, DO      . LORazepam (ATIVAN) injection 0-4 mg  0-4 mg Intravenous 4 times per day Samuel Jester, DO   1 mg at 06/13/14 1231  . [START ON 06/15/2014] LORazepam (ATIVAN) injection 0-4 mg  0-4 mg Intravenous Q12H Samuel Jester, DO      . LORazepam  (ATIVAN) tablet 0-4 mg  0-4 mg Oral 4 times per day Samuel Jester, DO   0 mg at 06/13/14 1232  . [START ON 06/15/2014] LORazepam (ATIVAN) tablet 0-4 mg  0-4 mg Oral Q12H Samuel Jester, DO      . spironolactone (ALDACTONE) tablet 50 mg  50 mg Oral Daily Gust Rung, DO      . thiamine (B-1) injection 100 mg  100 mg Intravenous Daily Samuel Jester, DO   100 mg at 06/13/14 1230  . thiamine (VITAMIN B-1) tablet 100 mg  100 mg Oral Daily Samuel Jester, DO   100 mg at 06/13/14 1231   Current Outpatient Prescriptions  Medication Sig Dispense Refill  . FLUoxetine (PROZAC) 10 MG capsule Take 1 capsule (10 mg total) by mouth daily. 30 capsule 3  . ibuprofen (ADVIL,MOTRIN) 200 MG tablet Take 2 tablets (400 mg total) by mouth every 6 (six) hours as needed for moderate pain. 30 tablet 0  . Tetrahydrozoline HCl (VISINE EXTRA OP) Apply 1-2 drops to eye daily as needed (redness.).    Marland Kitchen carbamide peroxide (DEBROX) 6.5 % otic solution Place 5 drops into both ears every 30 (thirty) days. (Patient not taking: Reported on 06/13/2014) 15 mL  Allergies: Allergies as of 06/13/2014  . (No Known Allergies)   Past Medical History  Diagnosis Date  . Blindness of left eye   . Hepatitis C   . Cirrhosis of liver   . Polysubstance abuse    Past Surgical History  Procedure Laterality Date  . Eye surgery     Family History  Problem Relation Age of Onset  . Diabetes Brother    History   Social History  . Marital Status: Married    Spouse Name: N/A  . Number of Children: N/A  . Years of Education: N/A   Occupational History  . Not on file.   Social History Main Topics  . Smoking status: Current Every Day Smoker -- 0.50 packs/day for 40 years  . Smokeless tobacco: Never Used  . Alcohol Use: 4.8 oz/week    8 Cans of beer, 0 Standard drinks or equivalent per week  . Drug Use: Yes    Special: IV, Cocaine  . Sexual Activity: Not on file   Other Topics Concern  . Not on file   Social  History Narrative    Review of Systems: Review of Systems  Constitutional: Negative for fever, chills and weight loss.  Eyes: Negative for pain.       Blind in left eye  Respiratory: Negative for cough and shortness of breath.   Cardiovascular: Positive for leg swelling. Negative for chest pain.  Gastrointestinal: Negative for abdominal pain, blood in stool and melena.  Genitourinary: Positive for dysuria (for months to years).  Musculoskeletal: Negative for myalgias.  Skin: Negative for rash.  Neurological: Negative for dizziness, seizures and headaches.  Psychiatric/Behavioral: Positive for substance abuse. Negative for depression and hallucinations.     Physical Exam: Blood pressure 157/95, pulse 93, temperature 98.2 F (36.8 C), temperature source Oral, resp. rate 17, height 5\' 9"  (1.753 m), weight 173 lb 3 oz (78.557 kg), SpO2 94 %. General: resting in bed, sleeping initially HEENT:, EOMI, no scleral icterus, left eye sclerosed  Cardiac: RRR, no rubs, murmurs or gallops Chest: no noted spider telangiectasias Pulm: CTAB Abd: distended tense abdomen, nontender, +BS, difficult to palpate liver edge, mild caput medusa GU: uncircumcised, swollen scrotum, minimal tenderness of testes   Ext: warm and well perfused, 2-3+ bilateral pedal edema, decreased hair growth of lower 1/3 of legs bilaterally Neuro: alert and oriented X4, moving all extremities  Lab results: Basic Metabolic Panel:  Recent Labs  51/88/4105/15/16 1204  NA 136  K 3.5  CL 101  CO2 27  GLUCOSE 107*  BUN 7  CREATININE 0.77  CALCIUM 8.0*   Liver Function Tests:  Recent Labs  06/13/14 1204  AST 125*  ALT 72*  ALKPHOS 226*  BILITOT 0.8  PROT 7.3  ALBUMIN 2.4*   No results for input(s): LIPASE, AMYLASE in the last 72 hours.  Recent Labs  06/13/14 1204  AMMONIA 19   CBC:  Recent Labs  06/13/14 1204  WBC 4.9  NEUTROABS 2.0  HGB 11.0*  HCT 33.0*  MCV 98.2  PLT 40*   Cardiac Enzymes: No  results for input(s): CKTOTAL, CKMB, CKMBINDEX, TROPONINI in the last 72 hours. BNP: No results for input(s): PROBNP in the last 72 hours. D-Dimer: No results for input(s): DDIMER in the last 72 hours. CBG: No results for input(s): GLUCAP in the last 72 hours. Hemoglobin A1C: No results for input(s): HGBA1C in the last 72 hours. Fasting Lipid Panel: No results for input(s): CHOL, HDL, LDLCALC, TRIG, CHOLHDL, LDLDIRECT in the  last 72 hours. Thyroid Function Tests: No results for input(s): TSH, T4TOTAL, FREET4, T3FREE, THYROIDAB in the last 72 hours. Anemia Panel: No results for input(s): VITAMINB12, FOLATE, FERRITIN, TIBC, IRON, RETICCTPCT in the last 72 hours. Coagulation:  Recent Labs  06/13/14 1204  LABPROT 17.8*  INR 1.45   Urine Drug Screen: Drugs of Abuse     Component Value Date/Time   LABOPIA NONE DETECTED 06/13/2014 1440   COCAINSCRNUR POSITIVE* 06/13/2014 1440   LABBENZ NONE DETECTED 06/13/2014 1440   AMPHETMU NONE DETECTED 06/13/2014 1440   THCU NONE DETECTED 06/13/2014 1440   LABBARB NONE DETECTED 06/13/2014 1440    Alcohol Level:  Recent Labs  06/13/14 1204  ETH 159*   Urinalysis:  Recent Labs  06/13/14 1440  COLORURINE AMBER*  LABSPEC 1.026  PHURINE 5.5  GLUCOSEU NEGATIVE  HGBUR NEGATIVE  BILIRUBINUR SMALL*  KETONESUR 15*  PROTEINUR NEGATIVE  UROBILINOGEN 1.0  NITRITE NEGATIVE  LEUKOCYTESUR NEGATIVE    Imaging results:  [place] Abdomen Complete  06/13/2014   CLINICAL DATA:  Cirrhosis.  Abdominal swelling.  EXAM: ULTRASOUND ABDOMEN COMPLETE  COMPARISON:  None.  FINDINGS: Gallbladder: There is gallbladder wall thickening but no evidence of stones and no Murphy's sign. The wall measures up to 6 mm in thickness.  Common bile duct: Diameter: 3 mm  Liver: The liver is diffusely heterogeneous but there is no focal mass. The caudate lobe and left lobe are somewhat prominent consistent with the history of cirrhosis.  IVC: No abnormality visualized.   Pancreas: Visualized portion unremarkable.  Spleen: Size and appearance within normal limits.  Right Kidney: Length: 11.4 cm. Echogenicity within normal limits. No mass or hydronephrosis visualized.  Left Kidney: Length: 11.0 cm. Echogenicity within normal limits. No mass or hydronephrosis visualized.  Abdominal aorta: Portions of the aorta were obscured. Maximal visualized caliber was 2.4 cm.  Other findings: Small amount of ascites is present.  IMPRESSION: Cirrhotic liver.  Small amount of ascites.  The liver is heterogeneous without focal mass.  Nonspecific gallbladder wall thickening.   Electronically Signed   By: Jolaine ClickArthur  Hoss M.D.   On: 06/13/2014 14:07   [place] Scrotum  06/13/2014   CLINICAL DATA:  Testicular swelling and pain since yesterday. Pain on both sides. Concern for testicular torsion.  EXAM: SCROTAL ULTRASOUND  DOPPLER ULTRASOUND OF THE TESTICLES  TECHNIQUE: Complete ultrasound examination of the testicles, epididymis, and other scrotal structures was performed. Color and spectral Doppler ultrasound were also utilized to evaluate blood flow to the testicles.  COMPARISON:  None.  FINDINGS: Right testicle  Measurements: Normal in size and homogeneous echotexture measuring 3.9 x 2.4 x 2.3 cm. Normal color Doppler flow and spectral waveforms.  Left testicle  Measurements: Normal in size and homogeneous in echotexture measuring 3.9 x 2.5 x 2.8 cm. Normal color Doppler flow and spectral waveforms.  Right epididymis:  No abnormality is seen  Left epididymis: There is increased vascularity and mild enlargement of the tail of the left epididymis.  Hydrocele:  Bilateral small hydroceles which are anechoic  Varicocele:  None visualized.  Pulsed Doppler interrogation of both testes demonstrates normal low resistance arterial and venous waveforms bilaterally.  Bilateral scrotal edema  IMPRESSION: 1. Normal testicles with no evidence of ovarian torsion or orchitis. 2. Heterogeneous and hypervascular left epididymis  is most consistent with epididymitis. 3. Bilateral scrotal thickening and edema likely related to epididymitis. 4. Bilateral small hydroceles.   Electronically Signed   By: Genevive BiStewart  Edmunds M.D.   On: 06/13/2014 13:51  [place] Art/ven Flow Abd Pelv Doppler  06/13/2014   : CLINICAL DATA: Testicular swelling and pain since yesterday. Pain on both sides. Concern for testicular torsion.  EXAM: SCROTAL ULTRASOUND  DOPPLER ULTRASOUND OF THE TESTICLES  TECHNIQUE: Complete ultrasound examination of the testicles, epididymis, and other scrotal structures was performed. Color and spectral Doppler ultrasound were also utilized to evaluate blood flow to the testicles.  COMPARISON: None.  FINDINGS: Right testicle  Measurements: Normal in size and homogeneous echotexture measuring 3.9 x 2.4 x 2.3 cm. Normal color Doppler flow and spectral waveforms.  Left testicle  Measurements: Normal in size and homogeneous in echotexture measuring 3.9 x 2.5 x 2.8 cm. Normal color Doppler flow and spectral waveforms.  Right epididymis: No abnormality is seen  Left epididymis: There is increased vascularity and mild enlargement of the tail of the left epididymis.  Hydrocele: Bilateral small hydroceles which are anechoic  Varicocele: None visualized.  Pulsed Doppler interrogation of both testes demonstrates normal low resistance arterial and venous waveforms bilaterally.  Bilateral scrotal edema  IMPRESSION: 1. Normal testicles with no evidence of ovarian torsion or orchitis. 2. Heterogeneous and hypervascular left epididymis is most consistent with epididymitis. 3. Bilateral scrotal thickening and edema likely related to epididymitis. 4. Bilateral small hydroceles.   Electronically Signed   By: Genevive Bi M.D.   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  He  does have significant peripherial edema.  - Will start Spironolactone  and Lasix  oral for fluid removal, paracentesis not likely helpful for theraputic relief and patient has known cirrhosis. - Will work up alternative causes for his lower extremity swelling.    Thrombocytopenia - Suspect due to Cirrhosis causing splenomegaly as well as direct effects of ETOH on bone marrow, He needs to stop drinking.    Alcohol intoxication - CIWA protocol for withdraw, he apparently does get withdraw symptoms but never has had full DT or seizures.    Normocytic anemia - Likely ACD/ Alcoholism.    Peripheral edema - Suspect this is due to decompensated cirrhosis, however his albumin of 2.4 (was 2.9 in December) does not seem to fully explain especially given only small ascites by ultra sound.  Will also obtain a TTE and BNP to evaluate for right heart failure.     Left epididymitis - Would suspect more pain on exam, but patient is intoxicated. - Will test for GC/C, but also treat empirically. ceftriaxone 250x1, Doxycycline  BID x 10 days. - Can also use NSAIDs    Dysuria - Ongoing for years per report.  Given his hep C and lack of healthcare I am suspicious for STDs, he has a recent HIV ab that is negative. - Will check RPR, GC, C, will also empirically treat for GC/C given concern for epidyumitis    Alcohol use disorder, severe, dependence - Monitor on Med surg with CIWA driven protocol for ETOH withdraw monitoring. - If advances overnight will transfer to step down and initiate the STEPDOWN ALCOHOL WITHDRAW PROTOCOL.  Diet: Low Sodium Code: full DVT: SCDs Dispo: Disposition is deferred at this time, awaiting improvement of current medical problems. Anticipated discharge in approximately 1 day(s).   The patient does have a current PCP (Doris Cheadle, MD) and does not need an [facility] hospital follow-up appointment  after discharge.  The patient does not have transportation limitations  that hinder transportation to clinic appointments.  Signed: Gust Rung, DO IMTS PGY-2 Pager: [phone] 06/13/2014, 3:36 PM

## 2014-06-13 NOTE — ED Notes (Signed)
MD McManus at the bedside.  

## 2014-06-14 ENCOUNTER — Encounter (HOSPITAL_COMMUNITY): Payer: Self-pay | Admitting: General Practice

## 2014-06-14 DIAGNOSIS — B9689 Other specified bacterial agents as the cause of diseases classified elsewhere: Secondary | ICD-10-CM | POA: Diagnosis not present

## 2014-06-14 DIAGNOSIS — K7031 Alcoholic cirrhosis of liver with ascites: Secondary | ICD-10-CM | POA: Diagnosis not present

## 2014-06-14 DIAGNOSIS — B182 Chronic viral hepatitis C: Secondary | ICD-10-CM | POA: Diagnosis not present

## 2014-06-14 DIAGNOSIS — N451 Epididymitis: Secondary | ICD-10-CM | POA: Diagnosis not present

## 2014-06-14 LAB — BASIC METABOLIC PANEL
Anion gap: 6 (ref 5–15)
BUN: 5 mg/dL — AB (ref 6–20)
CO2: 27 mmol/L (ref 22–32)
Calcium: 7.7 mg/dL — ABNORMAL LOW (ref 8.9–10.3)
Chloride: 101 mmol/L (ref 101–111)
Creatinine, Ser: 0.86 mg/dL (ref 0.61–1.24)
GFR calc Af Amer: >60 mL/min (ref >60)
GFR calc non Af Amer: >60 mL/min (ref >60)
GLUCOSE: 120 mg/dL — AB (ref 65–99)
POTASSIUM: 3.4 mmol/L — AB (ref 3.5–5.1)
SODIUM: 134 mmol/L — AB (ref 135–145)

## 2014-06-14 LAB — CYTOLOGY - NON PAP
Chlamydia: NEGATIVE
NEISSERIA GONORRHEA: NEGATIVE

## 2014-06-14 LAB — URINE CULTURE: Colony Count: 3000

## 2014-06-14 LAB — RPR: RPR: NONREACTIVE

## 2014-06-14 MED ORDER — SPIRONOLACTONE 100 MG PO TABS
100.0000 mg | ORAL_TABLET | Freq: Every day | ORAL | Status: DC
  Administered 2014-06-15: 100 mg via ORAL
  Filled 2014-06-14: qty 1

## 2014-06-14 MED ORDER — FUROSEMIDE 40 MG PO TABS
40.0000 mg | ORAL_TABLET | Freq: Every day | ORAL | Status: DC
  Administered 2014-06-15: 40 mg via ORAL
  Filled 2014-06-14: qty 1

## 2014-06-14 NOTE — Progress Notes (Signed)
Subjective: Reports he feels a little less swollen, nausea has improved. No scrotal pain. Objective: Vital signs in last 24 hours: Filed Vitals:   06/13/14 1638 06/13/14 2152 06/14/14 0009 06/14/14 0547  BP: 160/86 130/66 138/74 157/83  Pulse: 91 102 91 95  Temp: 98.4 F (36.9 C) 100.4 F (38 C) 99.9 F (37.7 C) 99.7 F (37.6 C)  TempSrc: Oral Oral Oral Oral  Resp: 16 18 18 18   Height: 5\' 9"  (1.753 m)     Weight: 173 lb (78.472 kg)     SpO2: 97% 95% 94% 93%   Weight change:   Intake/Output Summary (Last 24 hours) at 06/14/14 1032 Last data filed at 06/14/14 0847  Gross per 24 hour  Intake    480 ml  Output      0 ml  Net    480 ml   General: resting in chair NAD HEENT: PERRL on right, EOMI, no scleral icterus, left eye cloudy Cardiac: mildly tachycardic, no rubs, murmurs or gallops Pulm: clear to auscultation bilaterally, moving normal volumes of air Abd: soft, nontender, mod distended, BS present, + fluid wave GU: scrotum still mildly edematous but minimal tenderness Ext: warm and well perfused, 2+ pedal edema bilaterally Neuro: alert and oriented X4  Lab Results: Basic Metabolic Panel:  Recent Labs Lab 06/13/14 1204 06/14/14 0540  NA 136 134*  K 3.5 3.4*  CL 101 101  CO2 27 27  GLUCOSE 107* 120*  BUN 7 5*  CREATININE 0.77 0.86  CALCIUM 8.0* 7.7*   Liver Function Tests:  Recent Labs Lab 06/13/14 1204  AST 125*  ALT 72*  ALKPHOS 226*  BILITOT 0.8  PROT 7.3  ALBUMIN 2.4*   No results for input(s): LIPASE, AMYLASE in the last 168 hours.  Recent Labs Lab 06/13/14 1204  AMMONIA 19   CBC:  Recent Labs Lab 06/13/14 1204  WBC 4.9  NEUTROABS 2.0  HGB 11.0*  HCT 33.0*  MCV 98.2  PLT 40*   Cardiac Enzymes: No results for input(s): CKTOTAL, CKMB, CKMBINDEX, TROPONINI in the last 168 hours. BNP: No results for input(s): PROBNP in the last 168 hours. D-Dimer: No results for input(s): DDIMER in the last 168 hours. CBG: No results for  input(s): GLUCAP in the last 168 hours. Hemoglobin A1C: No results for input(s): HGBA1C in the last 168 hours. Fasting Lipid Panel: No results for input(s): CHOL, HDL, LDLCALC, TRIG, CHOLHDL, LDLDIRECT in the last 168 hours. Thyroid Function Tests: No results for input(s): TSH, T4TOTAL, FREET4, T3FREE, THYROIDAB in the last 168 hours. Coagulation:  Recent Labs Lab 06/13/14 1204  LABPROT 17.8*  INR 1.45   Anemia Panel: No results for input(s): VITAMINB12, FOLATE, FERRITIN, TIBC, IRON, RETICCTPCT in the last 168 hours. Urine Drug Screen: Drugs of Abuse     Component Value Date/Time   LABOPIA NONE DETECTED 06/13/2014 1440   COCAINSCRNUR POSITIVE* 06/13/2014 1440   LABBENZ NONE DETECTED 06/13/2014 1440   AMPHETMU NONE DETECTED 06/13/2014 1440   THCU NONE DETECTED 06/13/2014 1440   LABBARB NONE DETECTED 06/13/2014 1440    Alcohol Level:  Recent Labs Lab 06/13/14 1204  ETH 159*   Urinalysis:  Recent Labs Lab 06/13/14 1440  COLORURINE AMBER*  LABSPEC 1.026  PHURINE 5.5  GLUCOSEU NEGATIVE  HGBUR NEGATIVE  BILIRUBINUR SMALL*  KETONESUR 15*  PROTEINUR NEGATIVE  UROBILINOGEN 1.0  NITRITE NEGATIVE  LEUKOCYTESUR NEGATIVE   Misc. Labs:  Micro Results: No results found for this or any previous visit (from the past 240  hour(s)). Studies/Results: US Abdomen Complete  06/13/2014   CLINICAL DATA:  Cirrhosis.  Abdominal swelling.  EXAM: ULTRASOUND ABDOMEN COMPLETE  COMPARISON:  None.  FINDINGS: Gallbladder: There is gallbladder wall thickening but no evidence of stones and no Murphy's sign. The wall measures up to 6 mm in thickness.  Common bile duct: Diameter: 3 mm  Liver: The liver is diffusely heterogeneous but there is no focal mass. The caudate lobe and left lobe are somewhat prominent consistent with the history of cirrhosis.  IVC: No abnormality visualized.  Pancreas: Visualized portion unremarkable.  Spleen: Size and appearance within normal limits.  Right Kidney:  Length: 11.4 cm. Echogenicity within normal limits. No mass or hydronephrosis visualized.  Left Kidney: Length: 11.0 cm. Echogenicity within normal limits. No mass or hydronephrosis visualized.  Abdominal aorta: Portions of the aorta were obscured. Maximal visualized caliber was 2.4 cm.  Other findings: Small amount of ascites is present.  IMPRESSION: Cirrhotic liver.  Small amount of ascites.  The liver is heterogeneous without focal mass.  Nonspecific gallbladder wall thickening.   Electronically Signed   By: Jolaine Click M.D.   On: 06/13/2014 14:07   US Scrotum  06/13/2014   CLINICAL DATA:  Testicular swelling and pain since yesterday. Pain on both sides. Concern for testicular torsion.  EXAM: SCROTAL ULTRASOUND  DOPPLER ULTRASOUND OF THE TESTICLES  TECHNIQUE: Complete ultrasound examination of the testicles, epididymis, and other scrotal structures was performed. Color and spectral Doppler ultrasound were also utilized to evaluate blood flow to the testicles.  COMPARISON:  None.  FINDINGS: Right testicle  Measurements: Normal in size and homogeneous echotexture measuring 3.9 x 2.4 x 2.3 cm. Normal color Doppler flow and spectral waveforms.  Left testicle  Measurements: Normal in size and homogeneous in echotexture measuring 3.9 x 2.5 x 2.8 cm. Normal color Doppler flow and spectral waveforms.  Right epididymis:  No abnormality is seen  Left epididymis: There is increased vascularity and mild enlargement of the tail of the left epididymis.  Hydrocele:  Bilateral small hydroceles which are anechoic  Varicocele:  None visualized.  Pulsed Doppler interrogation of both testes demonstrates normal low resistance arterial and venous waveforms bilaterally.  Bilateral scrotal edema  IMPRESSION: 1. Normal testicles with no evidence of ovarian torsion or orchitis. 2. Heterogeneous and hypervascular left epididymis is most consistent with epididymitis. 3. Bilateral scrotal thickening and edema likely related to  epididymitis. 4. Bilateral small hydroceles.   Electronically Signed   By: Genevive Bi M.D.   On: 06/13/2014 13:51   [place] Art/ven Flow Abd Pelv Doppler  06/13/2014   : CLINICAL DATA: Testicular swelling and pain since yesterday. Pain on both sides. Concern for testicular torsion.  EXAM: SCROTAL ULTRASOUND  DOPPLER ULTRASOUND OF THE TESTICLES  TECHNIQUE: Complete ultrasound examination of the testicles, epididymis, and other scrotal structures was performed. Color and spectral Doppler ultrasound were also utilized to evaluate blood flow to the testicles.  COMPARISON: None.  FINDINGS: Right testicle  Measurements: Normal in size and homogeneous echotexture measuring 3.9 x 2.4 x 2.3 cm. Normal color Doppler flow and spectral waveforms.  Left testicle  Measurements: Normal in size and homogeneous in echotexture measuring 3.9 x 2.5 x 2.8 cm. Normal color Doppler flow and spectral waveforms.  Right epididymis: No abnormality is seen  Left epididymis: There is increased vascularity and mild enlargement of the tail of the left epididymis.  Hydrocele: Bilateral small hydroceles which are anechoic  Varicocele: None visualized.  Pulsed Doppler interrogation of both testes demonstrates  normal low resistance arterial and venous waveforms bilaterally.  Bilateral scrotal edema  IMPRESSION: 1. Normal testicles with no evidence of ovarian torsion or orchitis. 2. Heterogeneous and hypervascular left epididymis is most consistent with epididymitis. 3. Bilateral scrotal thickening and edema likely related to epididymitis. 4. Bilateral small hydroceles.   Electronically Signed   By: Genevive BiStewart  Edmunds M.D.   On: 06/13/2014 15:09   Medications: I have reviewed the patient's current medications. Scheduled Meds: . doxycycline  100 mg Oral Q12H  . folic acid  1 mg Oral Daily  . [START ON 06/15/2014] furosemide  40 mg Oral Daily  . multivitamin with minerals  1 tablet Oral Daily  . sodium chloride  3 mL Intravenous Q12H  . [START  ON 06/15/2014] spironolactone  100 mg Oral Daily  . thiamine  100 mg Oral Daily   Or  . 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   Thrombocytopenia - Suspect due to Cirrhosis causing splenomegaly as well as direct effects of ETOH on bone marrow, He needs to stop drinking. On folic acid supplementation.   Alcohol intoxication - CIWA protocol for withdraw, he apparently does get withdraw symptoms but never has had full DT or seizures.   Normocytic anemia - Likely ACD/ Alcoholism.   Peripheral edema - BNP not elevated, suspect this is all due to low oncotic pressure.  Will adjust diurectics   Left epididymitis - Would suspect more pain on exam, still not much - Will test for GC/C, but also treating empirically. ceftriaxone 250x1, Doxycycline 100mg  BID x 10 days. - Can also use NSAIDs   Dysuria - U/A reassuring - GC/C treated given epididymitis but also tested.   Alcohol use disorder, severe, dependence - Monitor on Med surg with CIWA driven protocol for ETOH withdraw monitoring. - If advances will transfer to step down and initiate the STEPDOWN ALCOHOL WITHDRAW PROTOCOL. - CIWA minimal right now.  Diet: Heart Healthy Code: full DVT: SCDs (Thrombocytopenia) Dispo: Disposition is deferred at this time, awaiting improvement of current medical problems.  Anticipated discharge in approximately 1 day(s).   The patient does have a current PCP (Doris Cheadleeepak Advani, MD) and [facility] after discharge.  The patient does not have transportation limitations that hinder transportation to clinic appointments.  .Services Needed at time of discharge: Y = Yes, Blank = No PT:   OT:   RN:   Equipment:   Other:         Gust RungErik C Ion Gonnella, DO 06/14/2014, 10:32 AM

## 2014-06-14 NOTE — Progress Notes (Signed)
Subjective: No acute events overnight. Patient's genital pain has decreased though swelling persists. Patient's nausea has abated. Patient is vocal about quitting alcohol.  Objective: Vital signs in last 24 hours: Filed Vitals:   06/13/14 1638 06/13/14 2152 06/14/14 0009 06/14/14 0547  BP: 160/86 130/66 138/74 157/83  Pulse: 91 102 91 95  Temp: 98.4 F (36.9 C) 100.4 F (38 C) 99.9 F (37.7 C) 99.7 F (37.6 C)  TempSrc: Oral Oral Oral Oral  Resp: 16 18 18 18   Height: 5\' 9"  (1.753 m)     Weight: 78.472 kg (173 lb)     SpO2: 97% 95% 94% 93%   Weight change:   Intake/Output Summary (Last 24 hours) at 06/14/14 0810 Last data filed at 06/13/14 1700  Gross per 24 hour  Intake    240 ml  Output      0 ml  Net    240 ml   Physical Exam: General appearance: cooperative, sitting upright eating in chair, NAD Head: Normocephalic, without obvious abnormality, atraumatic Eyes: conjunctivae clear. PERRL, EOM's intact. Left eye cloudy and sclerotic Lungs: clear to auscultation bilaterally, normal WOB Heart: regular rate and rhythm, S1, S2 normal, no murmur, click, rub or gallop Abdomen: tense, non-tender, moderately distended with ascitic fluid, mild caput medusae, positive BS Male genitalia: normal, penis: edematous with no tenderness, scrotum: edematous with mild tenderness Extremities: extremities atraumatic, 2+ pitting edema in BLEs Skin: Skin color, texture, turgor normal. No rashes or lesions   Lab Results: Basic Metabolic Panel:  Recent Labs Lab 06/13/14 1204 06/14/14 0540  NA 136 134*  K 3.5 3.4*  CL 101 101  CO2 27 27  GLUCOSE 107* 120*  BUN 7 5*  CREATININE 0.77 0.86  CALCIUM 8.0* 7.7*   Urine Drug Screen: Drugs of Abuse     Component Value Date/Time   LABOPIA NONE DETECTED 06/13/2014 1440   COCAINSCRNUR POSITIVE* 06/13/2014 1440   LABBENZ NONE DETECTED 06/13/2014 1440   AMPHETMU NONE DETECTED 06/13/2014 1440   THCU NONE DETECTED 06/13/2014 1440   LABBARB  NONE DETECTED 06/13/2014 1440    Misc. Labs: RPR: non-reactive BNP: 74.5  Micro Results: No results found for this or any previous visit (from the past 240 hour(s)).  Studies/Results: No new studies.  Medications: I have reviewed the patient's current medications. Scheduled Meds: . doxycycline  100 mg Oral Q12H  . folic acid  1 mg Oral Daily  . furosemide  20 mg Oral Daily  . multivitamin with minerals  1 tablet Oral Daily  . sodium chloride  3 mL Intravenous Q12H  . spironolactone  50 mg Oral Daily  . thiamine  100 mg Oral Daily   Or  . thiamine  100 mg Intravenous Daily   Continuous Infusions:  PRN Meds:.sodium chloride, LORazepam **OR** LORazepam, sodium chloride   Assessment/Plan: Active Problems:   Alcohol use disorder, severe, dependence   Chronic hepatitis C without hepatic coma   Cirrhosis of liver with ascites   Thrombocytopenia   Alcohol intoxication   Normocytic anemia   Peripheral edema   Left epididymitis   Dysuria   Albert HedgeDavid Salazar is a 59 year old male with history of hepatitis C, cirrhosis, and alcohol use disorder who presented with cirrhosis of liver with ascites, left epididymitis, and acute alcohol intoxication.  Cirrhosis of liver with ascites: Patient has a known history of cirrhosis and reports a small amount of ascites for the last two weeks, confirmed by ultrasound. Patient's thrombocytopenia (platelets: 40), low albumin (2.4), and  INR are all consistent with cirrhosis. Patient was last seen by Dr. Luciana Axeomer on 06/07/14 for initial evaluation of a positive Hepatitis C antibody test. Patient tested positive for Hepatitis C on 04/21/14. Patient is not currently receiving any treatment for his liver disease. Liver enzymes are not significantly elevated (AST: 125, ALT: 72, alkaline phosphatase: 226), but this could be a sign of advanced liver disease. Ammonia level: 19. - furosemide 20mg  PO daily - spironolactone 50mg  PO daily  Chronic hepatitis C: Patient was  last seen by Dr. Luciana Axeomer on 06/07/14 for initial evaluation of a positive Hepatitis C antibody test. Patient tested positive for Hepatitis C on 04/21/14. Patient is not currently receiving any treatment for his liver disease.   Alcohol use disorder + acute alcohol intoxication: No signs of alcohol withdrawal. Patient presented with ethanol level of 159 and reports a last drink of 2 40 ounce beers the previous night. Patient reports drinking 1 - 2 40 ounce beers daily and has a known prior history of alcohol withdrawal and DTs. UDS positive for cocaine. - CIWA - IV ativan per CIWA protocol - thiamine - folic acid - daily BMP  Normocytic Anemia: Hb: 11.0. MCV: 98.2. Possible anemia of chronic disease vs. anemia of chronic alcoholism. No signs of active bleeding. - continue to monitor  Thrombocytopenia: c/w cirrhosis. Platelets: 40 on admission compared to 50 on 04/21/14. No active signs of bleeding. - continue to monitor   Peripheral edema: 2+ pitting edema of bilateral lower extremities. Swelling may be c/w anasarca 2/2 to liver cirrhosis. Patient has no know history of CHF, though a workup for heart failure may be advisable. BNP 74.5. - consider TTE  Left epididymitis: Patient presented with a one day history of genital pain and swelling; pain is improved this morning. Ultrasound confirmed left-sided epididymitis. Genital swelling is also c/w anasarca 2/2 to liver cirrhosis. Received ceftriaxone 250mg  IM x1 dose. - doxycycline 100mg  PO q12h x 9 days (10 days total)   Dysuria: Patient reports long-standing history of dysuria (months - years). Urinalysis shows no signs of urinary infection. HIV antibody on 05/28/14 was non-reactive. RPR non-reactive. - f/u GC chlamydia - f/u urine culture  FEN/GI: DVT PPx: SCDs Diet: Low-sodium Code: Full  This is a Psychologist, occupationalMedical Student Note.  The care of the patient was discussed with Dr. Carlynn PurlErik Hoffman and the assessment and plan formulated with their assistance.   Please see their attached note for official documentation of the daily encounter.     Laurena SpiesKhadijah Winton [place], Med Student 06/14/2014, 8:10 AM

## 2014-06-15 ENCOUNTER — Telehealth: Payer: Self-pay | Admitting: *Deleted

## 2014-06-15 DIAGNOSIS — B9689 Other specified bacterial agents as the cause of diseases classified elsewhere: Secondary | ICD-10-CM | POA: Diagnosis not present

## 2014-06-15 DIAGNOSIS — N451 Epididymitis: Secondary | ICD-10-CM | POA: Diagnosis not present

## 2014-06-15 DIAGNOSIS — K7031 Alcoholic cirrhosis of liver with ascites: Secondary | ICD-10-CM | POA: Diagnosis not present

## 2014-06-15 DIAGNOSIS — R600 Localized edema: Secondary | ICD-10-CM | POA: Diagnosis not present

## 2014-06-15 LAB — BASIC METABOLIC PANEL
Anion gap: 6 (ref 5–15)
CO2: 28 mmol/L (ref 22–32)
Calcium: 8.1 mg/dL — ABNORMAL LOW (ref 8.9–10.3)
Chloride: 99 mmol/L — ABNORMAL LOW (ref 101–111)
Creatinine, Ser: 0.89 mg/dL (ref 0.61–1.24)
GLUCOSE: 126 mg/dL — AB (ref 65–99)
Potassium: 3.4 mmol/L — ABNORMAL LOW (ref 3.5–5.1)
SODIUM: 133 mmol/L — AB (ref 135–145)

## 2014-06-15 MED ORDER — DOXYCYCLINE HYCLATE 100 MG PO TABS: 100.0000 mg | ORAL_TABLET | Freq: Two times a day (BID) | ORAL | Status: AC

## 2014-06-15 MED ORDER — SPIRONOLACTONE 100 MG PO TABS: 100.0000 mg | ORAL_TABLET | Freq: Every day | ORAL | Status: DC

## 2014-06-15 MED ORDER — FUROSEMIDE 40 MG PO TABS: 40.0000 mg | ORAL_TABLET | Freq: Every day | ORAL | Status: DC

## 2014-06-15 MED ORDER — FOLIC ACID 1 MG PO TABS: 1.0000 mg | ORAL_TABLET | Freq: Every day | ORAL | Status: DC

## 2014-06-15 MED ORDER — ADULT MULTIVITAMIN W/MINERALS CH: 1.0000 | ORAL_TABLET | Freq: Every day | ORAL | Status: DC

## 2014-06-15 MED ORDER — THIAMINE HCL 100 MG PO TABS: 100.0000 mg | ORAL_TABLET | Freq: Every day | ORAL | Status: DC

## 2014-06-15 NOTE — Progress Notes (Signed)
Subjective: Swelling continues to improve, concerned that scrotal swelling has improved less quickly than his legs Objective: Vital signs in last 24 hours: Filed Vitals:   06/14/14 1157 06/14/14 2316 06/15/14 0500 06/15/14 0515  BP: 163/87 155/81  151/80  Pulse: 103 94  93  Temp: 99.1 F (37.3 C) 98.6 F (37 C)  98.4 F (36.9 C)  TempSrc: Oral Oral  Oral  Resp: Height:      Weight:   168 lb 14 oz (76.6 kg)   SpO2: 97% 96%  94%   Weight change: -4 lb 5 oz (-1.957 kg)  Intake/Output Summary (Last 24 hours) at 06/15/14 1105 Last data filed at 06/14/14 2317  Gross per 24 hour  Intake    320 ml  Output      0 ml  Net    320 ml   General: resting in bed HEENT: PERRL on right, EOMI, no scleral icterus, left eye cloudy Cardiac: RRR, no rubs, murmurs or gallops Pulm: clear to auscultation bilaterally, moving normal volumes of air Abd: mildly tense, nontender, mod distended, BS present, + fluid wave GU: scrotum still edematous but minimal tenderness Ext: warm and well perfused, 1+ pedal edema bilaterally Neuro: alert and oriented X4  Lab Results: Basic Metabolic Panel:  Recent Labs Lab 06/14/14 0540 06/15/14 0437  NA 134* 133*  K 3.4* 3.4*  CL 101 99*  CO2 27 28  GLUCOSE 120* 126*  BUN 5* <5*  CREATININE 0.86 0.89  CALCIUM 7.7* 8.1*   Liver Function Tests:  Recent Labs Lab 06/13/14 1204  AST 125*  ALT 72*  ALKPHOS 226*  BILITOT 0.8  PROT 7.3  ALBUMIN 2.4*   No results for input(s): LIPASE, AMYLASE in the last 168 hours.  Recent Labs Lab 06/13/14 1204  AMMONIA 19   CBC:  Recent Labs Lab 06/13/14 1204  WBC 4.9  NEUTROABS 2.0  HGB 11.0*  HCT 33.0*  MCV 98.2  PLT 40*   Coagulation:  Recent Labs Lab 06/13/14 1204  LABPROT 17.8*  INR 1.45   Urine Drug Screen: Drugs of Abuse     Component Value Date/Time   LABOPIA NONE DETECTED 06/13/2014 1440   COCAINSCRNUR POSITIVE* 06/13/2014 1440   LABBENZ NONE DETECTED 06/13/2014 1440    AMPHETMU NONE DETECTED 06/13/2014 1440   THCU NONE DETECTED 06/13/2014 1440   LABBARB NONE DETECTED 06/13/2014 1440    Alcohol Level:  Recent Labs Lab 06/13/14 1204  ETH 159*   Urinalysis:  Recent Labs Lab 06/13/14 1440  COLORURINE AMBER*  LABSPEC 1.026  PHURINE 5.5  GLUCOSEU NEGATIVE  HGBUR NEGATIVE  BILIRUBINUR SMALL*  KETONESUR 15*  PROTEINUR NEGATIVE  UROBILINOGEN 1.0  NITRITE NEGATIVE  LEUKOCYTESUR NEGATIVE   Misc. Labs:  Micro Results: Recent Results (from the past 240 hour(s))  Urine culture     Status: None   Collection Time: 06/13/14  2:40 PM  Result Value Ref Range Status   Specimen Description URINE, CLEAN CATCH  Final   Special Requests NONE  Final   Colony Count   Final    3,000 COLONIES/ML Performed at [facility]    Culture   Final    INSIGNIFICANT GROWTH Performed at [facility]    Report Status 06/14/2014 FINAL  Final   Studies/Results: US Abdomen Complete  06/13/2014   CLINICAL DATA:  Cirrhosis.  Abdominal swelling.  EXAM: ULTRASOUND ABDOMEN COMPLETE  COMPARISON:  None.  FINDINGS: Gallbladder: There is gallbladder wall thickening but  no evidence of stones and no Murphy's sign. The wall measures up to 6 mm in thickness.  Common bile duct: Diameter: 3 mm  Liver: The liver is diffusely heterogeneous but there is no focal mass. The caudate lobe and left lobe are somewhat prominent consistent with the history of cirrhosis.  IVC: No abnormality visualized.  Pancreas: Visualized portion unremarkable.  Spleen: Size and appearance within normal limits.  Right Kidney: Length: 11.4 cm. Echogenicity within normal limits. No mass or hydronephrosis visualized.  Left Kidney: Length: 11.0 cm. Echogenicity within normal limits. No mass or hydronephrosis visualized.  Abdominal aorta: Portions of the aorta were obscured. Maximal visualized caliber was 2.4 cm.  Other findings: Small amount of ascites is present.  IMPRESSION: Cirrhotic liver.  Small  amount of ascites.  The liver is heterogeneous without focal mass.  Nonspecific gallbladder wall thickening.   Electronically Signed   By: Jolaine ClickArthur  Hoss M.D.   On: 06/13/2014 14:07   [place] Scrotum  06/13/2014   CLINICAL DATA:  Testicular swelling and pain since yesterday. Pain on both sides. Concern for testicular torsion.  EXAM: SCROTAL ULTRASOUND  DOPPLER ULTRASOUND OF THE TESTICLES  TECHNIQUE: Complete ultrasound examination of the testicles, epididymis, and other scrotal structures was performed. Color and spectral Doppler ultrasound were also utilized to evaluate blood flow to the testicles.  COMPARISON:  None.  FINDINGS: Right testicle  Measurements: Normal in size and homogeneous echotexture measuring 3.9 x 2.4 x 2.3 cm. Normal color Doppler flow and spectral waveforms.  Left testicle  Measurements: Normal in size and homogeneous in echotexture measuring 3.9 x 2.5 x 2.8 cm. Normal color Doppler flow and spectral waveforms.  Right epididymis:  No abnormality is seen  Left epididymis: There is increased vascularity and mild enlargement of the tail of the left epididymis.  Hydrocele:  Bilateral small hydroceles which are anechoic  Varicocele:  None visualized.  Pulsed Doppler interrogation of both testes demonstrates normal low resistance arterial and venous waveforms bilaterally.  Bilateral scrotal edema  IMPRESSION: 1. Normal testicles with no evidence of ovarian torsion or orchitis. 2. Heterogeneous and hypervascular left epididymis is most consistent with epididymitis. 3. Bilateral scrotal thickening and edema likely related to epididymitis. 4. Bilateral small hydroceles.   Electronically Signed   By: Genevive BiStewart  Edmunds M.D.   On: 06/13/2014 13:51   [place] Art/ven Flow Abd Pelv Doppler  06/13/2014   : CLINICAL DATA: Testicular swelling and pain since yesterday. Pain on both sides. Concern for testicular torsion.  EXAM: SCROTAL ULTRASOUND  DOPPLER ULTRASOUND OF THE TESTICLES  TECHNIQUE: Complete ultrasound  examination of the testicles, epididymis, and other scrotal structures was performed. Color and spectral Doppler ultrasound were also utilized to evaluate blood flow to the testicles.  COMPARISON: None.  FINDINGS: Right testicle  Measurements: Normal in size and homogeneous echotexture measuring 3.9 x 2.4 x 2.3 cm. Normal color Doppler flow and spectral waveforms.  Left testicle  Measurements: Normal in size and homogeneous in echotexture measuring 3.9 x 2.5 x 2.8 cm. Normal color Doppler flow and spectral waveforms.  Right epididymis: No abnormality is seen  Left epididymis: There is increased vascularity and mild enlargement of the tail of the left epididymis.  Hydrocele: Bilateral small hydroceles which are anechoic  Varicocele: None visualized.  Pulsed Doppler interrogation of both testes demonstrates normal low resistance arterial and venous waveforms bilaterally.  Bilateral scrotal edema  IMPRESSION: 1. Normal testicles with no evidence of ovarian torsion or orchitis. 2. Heterogeneous and hypervascular left epididymis is most consistent  with epididymitis. 3. Bilateral scrotal thickening and edema likely related to epididymitis. 4. Bilateral small hydroceles.   Electronically Signed   By: Genevive BiStewart  Edmunds M.D.   On: 06/13/2014 15:09   Medications: I have reviewed the patient's current medications. Scheduled Meds: . doxycycline  100 mg Oral Q12H  . folic acid  1 mg Oral Daily  . furosemide  40 mg Oral Daily  . multivitamin with minerals  1 tablet Oral Daily  . sodium chloride  3 mL Intravenous Q12H  . spironolactone  100 mg Oral Daily  . thiamine  100 mg Oral Daily   Or  . 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 -Stable for discharge home can follow up with [facility]    Thrombocytopenia - Suspect due to Cirrhosis causing splenomegaly as well as direct effects of ETOH on bone marrow, He needs to stop drinking. On folic acid supplementation.   Normocytic anemia - Likely ACD/ Alcoholism.   Peripheral edema - BNP not elevated, suspect this is all due to low oncotic pressure, HTN not helping.  Continue Spironolactone and Lasix   Left epididymitis - Urine cytology neg for  GC/C, but treating empirically. ceftriaxone 250x1, Doxycycline 100mg  BID x 10 days. - Can also use NSAIDs - Will have patient follow up with Urology   Dysuria - U/A reassuring - GC/C treated given epididymitis but negative by urine cytology   Alcohol use disorder, severe, dependence - - CIWA protocol for withdraw monitored for 48 hours, no elevations stable for discharge - Social work consulted and provided resources  Diet: Heart Healthy Code: full DVT: SCDs (Thrombocytopenia) Dispo: Discharge home today  The patient does have a current PCP (Doris Cheadleeepak Advani, MD) and does not need an [facility] hospital follow-up appointment after discharge.  The patient does not have transportation limitations that hinder transportation to clinic appointments.  .Services Needed at time of discharge: Y = Yes, Blank = No PT:   OT:   RN:   Equipment:   Other:       Gust RungErik C Emaley Applin, DO 06/15/2014, 11:05 AM

## 2014-06-15 NOTE — Discharge Summary (Signed)
Name: Albert HedgeDavid Salazar MRN: [id number] DOB: 03-16-1955 59 y.o. PCP: Albert Cheadleeepak Advani, Salazar  Date of Admission: 06/13/2014 11:36 AM Date of Discharge: 06/15/2014 Attending Physician: Dr. Cephas DarbyJames Granfortuna Discharge Diagnosis: Active Problems:   Alcohol use disorder, severe, dependence   Chronic hepatitis C without hepatic coma   Cirrhosis of liver with ascites   Thrombocytopenia   Alcohol intoxication   Normocytic anemia   Peripheral edema   Left epididymitis   Dysuria  Discharge Medications:   Medication List    STOP taking these medications        carbamide peroxide 6.5 % otic solution  Commonly known as:  DEBROX      TAKE these medications        doxycycline 100 MG tablet  Commonly known as:  VIBRA-TABS  Take 1 tablet (100 mg total) by mouth every 12 (twelve) hours.     FLUoxetine 10 MG capsule  Commonly known as:  PROZAC  Take 1 capsule (10 mg total) by mouth daily.     folic acid 1 MG tablet  Commonly known as:  FOLVITE  Take 1 tablet (1 mg total) by mouth daily.     furosemide 40 MG tablet  Commonly known as:  LASIX  Take 1 tablet (40 mg total) by mouth daily.     ibuprofen 200 MG tablet  Commonly known as:  ADVIL,MOTRIN  Take 2 tablets (400 mg total) by mouth every 6 (six) hours as needed for moderate pain.     multivitamin with minerals Tabs tablet  Take 1 tablet by mouth daily.     spironolactone 100 MG tablet  Commonly known as:  ALDACTONE  Take 1 tablet (100 mg total) by mouth daily.     thiamine 100 MG tablet  Take 1 tablet (100 mg total) by mouth daily.     VISINE EXTRA OP  Apply 1-2 drops to eye daily as needed (redness.).        Disposition and follow-up:   Mr.Albert Salazar was discharged from [facility] in Stable condition.  At the hospital follow up visit please address:  1.  Ascites/Lower extremity swelling, started on Spironolactone and Lasix may need further titration.  2. Scrotal swelling/Epidemittis: Treated  empirically with Ceftriaxone and Doxycyline please reassess and consider urology referral.  3.  Labs / imaging needed at time of follow-up: None  4.  Pending labs/ test needing follow-up: None  Follow-up Appointments: Follow-up Information    Follow up with Albert Salazar. Go in 1 week.   Specialty:  Internal Medicine   Why:  10am hospital follow-up   Contact information:   [address] [place] [id number] [phone]       Discharge Instructions: Discharge Instructions    Diet - low sodium heart healthy    Complete by:  As directed      Increase activity slowly    Complete by:  As directed            Consultations:    Procedures Performed:  [place] Abdomen Complete  06/13/2014   CLINICAL DATA:  Cirrhosis.  Abdominal swelling.  EXAM: ULTRASOUND ABDOMEN COMPLETE  COMPARISON:  None.  FINDINGS: Gallbladder: There is gallbladder wall thickening but no evidence of stones and no Murphy's sign. The wall measures up to 6 mm in thickness.  Common bile duct: Diameter: 3 mm  Liver: The liver is diffusely heterogeneous but there is no focal mass. The caudate lobe and left lobe are somewhat prominent consistent  with the history of cirrhosis.  IVC: No abnormality visualized.  Pancreas: Visualized portion unremarkable.  Spleen: Size and appearance within normal limits.  Right Kidney: Length: 11.4 cm. Echogenicity within normal limits. No mass or hydronephrosis visualized.  Left Kidney: Length: 11.0 cm. Echogenicity within normal limits. No mass or hydronephrosis visualized.  Abdominal aorta: Portions of the aorta were obscured. Maximal visualized caliber was 2.4 cm.  Other findings: Small amount of ascites is present.  IMPRESSION: Cirrhotic liver.  Small amount of ascites.  The liver is heterogeneous without focal mass.  Nonspecific gallbladder wall thickening.   Electronically Signed   By: Jolaine ClickArthur  Hoss M.D.   On: 06/13/2014 14:07   [place] Scrotum  06/13/2014   CLINICAL DATA:  Testicular  swelling and pain since yesterday. Pain on both sides. Concern for testicular torsion.  EXAM: SCROTAL ULTRASOUND  DOPPLER ULTRASOUND OF THE TESTICLES  TECHNIQUE: Complete ultrasound examination of the testicles, epididymis, and other scrotal structures was performed. Color and spectral Doppler ultrasound were also utilized to evaluate blood flow to the testicles.  COMPARISON:  None.  FINDINGS: Right testicle  Measurements: Normal in size and homogeneous echotexture measuring 3.9 x 2.4 x 2.3 cm. Normal color Doppler flow and spectral waveforms.  Left testicle  Measurements: Normal in size and homogeneous in echotexture measuring 3.9 x 2.5 x 2.8 cm. Normal color Doppler flow and spectral waveforms.  Right epididymis:  No abnormality is seen  Left epididymis: There is increased vascularity and mild enlargement of the tail of the left epididymis.  Hydrocele:  Bilateral small hydroceles which are anechoic  Varicocele:  None visualized.  Pulsed Doppler interrogation of both testes demonstrates normal low resistance arterial and venous waveforms bilaterally.  Bilateral scrotal edema  IMPRESSION: 1. Normal testicles with no evidence of ovarian torsion or orchitis. 2. Heterogeneous and hypervascular left epididymis is most consistent with epididymitis. 3. Bilateral scrotal thickening and edema likely related to epididymitis. 4. Bilateral small hydroceles.   Electronically Signed   By: Genevive BiStewart  Edmunds M.D.   On: 06/13/2014 13:51   [place] Art/ven Flow Abd Pelv Doppler  06/13/2014   : CLINICAL DATA: Testicular swelling and pain since yesterday. Pain on both sides. Concern for testicular torsion.  EXAM: SCROTAL ULTRASOUND  DOPPLER ULTRASOUND OF THE TESTICLES  TECHNIQUE: Complete ultrasound examination of the testicles, epididymis, and other scrotal structures was performed. Color and spectral Doppler ultrasound were also utilized to evaluate blood flow to the testicles.  COMPARISON: None.  FINDINGS: Right testicle  Measurements:  Normal in size and homogeneous echotexture measuring 3.9 x 2.4 x 2.3 cm. Normal color Doppler flow and spectral waveforms.  Left testicle  Measurements: Normal in size and homogeneous in echotexture measuring 3.9 x 2.5 x 2.8 cm. Normal color Doppler flow and spectral waveforms.  Right epididymis: No abnormality is seen  Left epididymis: There is increased vascularity and mild enlargement of the tail of the left epididymis.  Hydrocele: Bilateral small hydroceles which are anechoic  Varicocele: None visualized.  Pulsed Doppler interrogation of both testes demonstrates normal low resistance arterial and venous waveforms bilaterally.  Bilateral scrotal edema  IMPRESSION: 1. Normal testicles with no evidence of ovarian torsion or orchitis. 2. Heterogeneous and hypervascular left epididymis is most consistent with epididymitis. 3. Bilateral scrotal thickening and edema likely related to epididymitis. 4. Bilateral small hydroceles.   Electronically Signed   By: Genevive BiStewart  Edmunds M.D.   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 He was sent to the [facility] for further evaluation as he had significant scrotal swelling and lower extremity edema. He notes the lower extremity edema has been getting worse over the past 2 weeks. He is actively intoxicated and is a limited historian. He does note that he has recently seen [facility] and also Dr. Luciana Axe from ID for his chronic Hep C. Per notes of Dr Luciana Axe he was having abdominal swelling 1 week ago. He does admit to ongoing ETOH use, usually 2 40oz beers a night but will also drink any liquor he can get ahold of. Last drink was last night. He reports his swollen scrotum brought him in today because it was uncomfortable to walk.   He reports he does have unprotected sex with a women he lives with, he  denies other sexual partners or MSM.   Hospital Course by problem list: Cirrhosis of liver with ascites: Patient was recently diagnosed with cirrhosis and presented with small amount of ascites confirmed by ultrasound (ascites appeared more significant on exam than ultrasound). Patient's thrombocytopenia (platelets: 40), low albumin (2.4), and INR were all consistent with cirrhosis. Patient was last seen by Dr. Luciana Axe on 06/07/14 for initial evaluation of a positive Hepatitis C antibody test. Patient tested positive for Hepatitis C on 04/21/14. Patient is not currently receiving any treatment for his liver disease. Liver enzymes were not significantly elevated (AST: 125, ALT: 72, alkaline phosphatase: 226), but this could be a sign of advanced liver disease. Patient was begun on spironolactone and furosemide. Patient was discharged on furosemide  PO daily and spironolactone  PO daily.  Chronic hepatitis C: Patient was last seen by Dr. Luciana Axe on 06/07/14 for initial evaluation of a positive Hepatitis C antibody test. Patient tested positive for Hepatitis C on 04/21/14. Patient is not currently receiving any treatment for his liver disease. Per chart review he does have follow up with Dr. Luciana Axe for possible treatment when he stops drinking.  Alcohol use disorder + acute alcohol intoxication: Patient presented acutely intoxicated. Patient reports drinking 1 - 2 40 ounce beers daily and has a known prior history of alcohol withdrawal and DTs. UDS was positive for cocaine. Patient was monitored on CIWA protocol for ~60 hours and exhibited no signs of alcohol withdrawal. Patient follow-up with patient regarding treatment for alcoholism.   Normocytic Anemia: Patient presented with Hb of 11.0 and MCV of 98.2, suggesting either anemia of chronic disease or anemia of chronic alcoholism. Patient exhibited no signs of active bleeding during hospitalization.  Thrombocytopenia: Consistent with cirrhosis/ ETOH Abuse.  Platelets were 40 on admission compared to 50 on 04/21/14.  Peripheral edema: Patient presented with pitting edema of bilateral lower extremities that decreased with diuresis. Patient's BNP was within normal limits (74.5). Edema ultimately attributed to anasarca 2/2 to liver cirrhosis.  Left epididymitis: Patient presented with a one day history of genital pain and swelling. Ultrasound confirmed left-sided epididymitis. Patient received ceftriaxone  IM x1 dose and was begun on doxycycline  PO q12h x 10 days. Patient discharged on doxycycline x7 days to complete 10 day course.  Pelase consider referral to Urology for follow up.  Dysuria: Patient reports long-standing history of dysuria (months - years). Urinalysis shows no signs of urinary infection. HIV antibody on 05/28/14 was non-reactive. RPR also non-reactive. And urine cytology for GC/ C was negative.  He was already given  presumptive treatment with Ceftriaxone  x1 and doxycycline for 10 days.  Discharge Vitals:   BP 151/80 mmHg  Pulse 93  Temp(Src) 98.4 F (36.9 C) (Oral)  Resp 18  Ht  (1.753 m)  Wt 168 lb 14 oz (76.6 kg)  BMI 24.93 kg/m2  SpO2 94%  Discharge Labs:  Results for orders placed or performed during the hospital encounter of 06/13/14 (from the past 24 hour(s))  Basic metabolic panel Once     Status: Abnormal   Collection Time: 06/15/14  4:37 AM  Result Value Ref Range   Sodium 133 (L) 135 - 145 mmol/L   Potassium 3.4 (L) 3.5 - 5.1 mmol/L   Chloride 99 (L) 101 - 111 mmol/L   CO2 28 22 - 32 mmol/L   Glucose, Bld 126 (H) 65 - 99 mg/dL   BUN <5 (L) 6 - 20 mg/dL   Creatinine, Ser 1.61 0.61 - 1.24 mg/dL   Calcium 8.1 (L) 8.9 - 10.3 mg/dL   GFR calc non Af Amer >60 >60 mL/min   GFR calc Af Amer >60 >60 mL/min   Anion gap 6 5 - 15    Signed: Gust Rung, DO 06/15/2014, 6:33 PM    Services Ordered on Discharge: none Equipment Ordered on Discharge: none

## 2014-06-15 NOTE — Progress Notes (Signed)
Albert Salazar to be D/C'd Home per MD order.  Discussed with the patient and all questions fully answered.  VSS, Skin clean, dry and intact without evidence of skin break down, no evidence of skin tears noted. IV catheter discontinued intact. Site without signs and symptoms of complications. Dressing and pressure applied.  An After Visit Summary was printed and given to the patient. Patient received prescriptions.  D/c education completed with patient/family including follow up instructions, medication list, d/c activities limitations if indicated, with other d/c instructions as indicated by MD - patient able to verbalize understanding, all questions fully answered.   Patient instructed to return to ED, call 911, or call MD for any changes in condition.    Albert Salazar, Albert Salazar D 06/15/2014 11:21 AM

## 2014-06-15 NOTE — Telephone Encounter (Signed)
[facility] calls and states there were no quantities on the folic acid, multi vit and thiamine, i said #30 w/ 3 additional refills, is this ok?

## 2014-06-15 NOTE — Telephone Encounter (Signed)
#  30 with 3 refills is fine for all three

## 2014-06-15 NOTE — Progress Notes (Signed)
Subjective: No acute events overnight. No longer complaining of genital pain. Reports intermittent burning with urination. Denies nausea, vomiting, tremulousness, AVH. Moderately hypertensive, but no signs of active withdrawal. Patient roughly 60 hours since last drink. Amenable to discharge with close outpatient follow-up.  Objective: Vital signs in last 24 hours: Filed Vitals:   06/14/14 1157 06/14/14 2316 06/15/14 0500 06/15/14 0515  BP: 163/87 155/81  151/80  Pulse: 103 94  93  Temp: 99.1 F (37.3 C) 98.6 F (37 C)  98.4 F (36.9 C)  TempSrc: Oral Oral  Oral  Resp: 18 18  18   Height:      Weight:   76.6 kg (168 lb 14 oz)   SpO2: 97% 96%  94%   Weight change: -1.957 kg (-4 lb 5 oz)  Intake/Output Summary (Last 24 hours) at 06/15/14 1113 Last data filed at 06/14/14 2317  Gross per 24 hour  Intake    320 ml  Output      0 ml  Net    320 ml   Physical Exam: General appearance: cooperative, lying comfortably in bed, NAD Head: Normocephalic, without obvious abnormality, atraumatic Eyes: conjunctivae clear. PERRL, EOM's intact. Left eye cloudy and sclerotic Lungs: clear to auscultation bilaterally, normal WOB Heart: regular rate and rhythm, S1, S2 normal, no murmur, click, rub or gallop Abdomen: tense, non-tender, moderately distended with ascitic fluid, mild caput medusae, positive BS Male genitalia: penis: edematous with no tenderness, scrotum: edematous with no tenderness Extremities: extremities atraumatic, 1+ pitting edema in BLEs Skin: Skin color, texture, turgor normal. No rashes or lesions  Lab Results: Basic Metabolic Panel:  Recent Labs Lab 06/14/14 0540 06/15/14 0437  NA 134* 133*  K 3.4* 3.4*  CL 101 99*  CO2 27 28  GLUCOSE 120* 126*  BUN 5* <5*  CREATININE 0.86 0.89  CALCIUM 7.7* 8.1*   Micro Results: Recent Results (from the past 240 hour(s))  Urine culture     Status: None   Collection Time: 06/13/14  2:40 PM  Result Value Ref Range Status   Specimen Description URINE, CLEAN CATCH  Final   Special Requests NONE  Final   Colony Count   Final    3,000 COLONIES/ML Performed at [facility]    Culture   Final    INSIGNIFICANT GROWTH Performed at [facility]    Report Status 06/14/2014 FINAL  Final   Studies/Results: No new studies.  Medications: I have reviewed the patient's current medications. Scheduled Meds: . doxycycline  100 mg Oral Q12H  . folic acid  1 mg Oral Daily  . furosemide  40 mg Oral Daily  . multivitamin with minerals  1 tablet Oral Daily  . sodium chloride  3 mL Intravenous Q12H  . spironolactone  100 mg Oral Daily  . thiamine  100 mg Oral Daily   Or  . thiamine  100 mg Intravenous Daily   Continuous Infusions:  PRN Meds:.sodium chloride, LORazepam **OR** LORazepam, sodium chloride   Assessment/Plan: Active Problems:   Alcohol use disorder, severe, dependence   Chronic hepatitis C without hepatic coma   Cirrhosis of liver with ascites   Thrombocytopenia   Alcohol intoxication   Normocytic anemia   Peripheral edema   Left epididymitis   Dysuria   Albert Salazar is a 59 year old male with history of hepatitis C, cirrhosis, and alcohol use disorder who presented with cirrhosis of liver with ascites, left epididymitis, and acute alcohol intoxication. Discharge today with close outpatient follow-up.  Cirrhosis of liver with ascites: Patient has a known history of cirrhosis and reports a small amount of ascites for the last two weeks, confirmed by ultrasound. Patient's thrombocytopenia (platelets: 40), low albumin (2.4), and INR are all consistent with cirrhosis. Patient was last seen by Dr. Luciana Axeomer on 06/07/14 for initial evaluation of a positive Hepatitis C antibody test. Patient tested positive for Hepatitis C on 04/21/14. Patient is not currently receiving any treatment for his liver disease. Liver enzymes are not significantly elevated (AST: 125, ALT: 72, alkaline phosphatase: 226), but  this could be a sign of advanced liver disease. Ammonia level: 19. - furosemide 40mg  PO daily - spironolactone 100mg  PO daily  Chronic hepatitis C: Patient was last seen by Dr. Luciana Axeomer on 06/07/14 for initial evaluation of a positive Hepatitis C antibody test. Patient tested positive for Hepatitis C on 04/21/14. Patient is not currently receiving any treatment for his liver disease.   Alcohol use disorder + acute alcohol intoxication: No signs of alcohol withdrawal. Patient presented with ethanol level of 159 and reports a last drink of 2 40 ounce beers the previous night. Patient reports drinking 1 - 2 40 ounce beers daily and has a known prior history of alcohol withdrawal and DTs. UDS positive for cocaine. - CIWA - consult to social work re: alcoholism  Normocytic Anemia: Hb: 11.0. MCV: 98.2. Possible anemia of chronic disease vs. anemia of chronic alcoholism. No signs of active bleeding. - continue to monitor  Thrombocytopenia: c/w cirrhosis. Platelets: 40 on admission compared to 50 on 04/21/14. No active signs of bleeding. - continue to monitor   Peripheral edema: Decreased edema today. Swelling may be c/w anasarca 2/2 to liver cirrhosis. Patient has no know history of CHF. BNP 74.5.  Left epididymitis: Patient presented with a one day history of genital pain and swelling; pain is improved this morning. Ultrasound confirmed left-sided epididymitis. Genital swelling is also c/w anasarca 2/2 to liver cirrhosis. Received ceftriaxone 250mg  IM x1 dose. - doxycycline 100mg  PO q12h x 8 days (10 days total)  - refer to outpatient urology  Dysuria: Patient reports long-standing history of dysuria (months - years). Urinalysis shows no signs of urinary infection. HIV antibody on 05/28/14 was non-reactive. RPR non-reactive. Urine culture negative. - refer to outpatient urology  FEN/GI: DVT PPx: SCDs Diet: Low-sodium Code: Full  This is a Psychologist, occupationalMedical Student Note.  The care of the patient was discussed  with Dr. Carlynn PurlErik Hoffman and the assessment and plan formulated with their assistance.  Please see their attached note for official documentation of the daily encounter.     Albert Salazar, Med Student 06/15/2014, 11:13 AM

## 2014-06-15 NOTE — Discharge Instructions (Signed)
I want you to take Spironolactone 100mg  a day and Lasix 40mg  daily.  This will help with your swelling.  You will need to finish a course of doxycycline 100mg  twice a day (8 more days)

## 2014-06-16 ENCOUNTER — Telehealth: Payer: Self-pay | Admitting: *Deleted

## 2014-06-16 ENCOUNTER — Ambulatory Visit: Payer: Self-pay | Admitting: *Deleted

## 2014-06-16 NOTE — Telephone Encounter (Signed)
Discharged from [facility] 06/15/14, wanting to start Harvoni.  One of the reasons that pt was in the hospital was ETOH abuse.  Pt stated that he thought that he would be starting Harvoni after his first visit to Dr. Luciana Axeomer.  RN explained that due to his ETOH abuse he needs to remain in counseling, stop drinking alcohol and be able to sign the "readiness" form that he will remain alcohol free.  RN scheduled the patient for a return appt with J. Herring for 06/23/14.

## 2014-06-22 ENCOUNTER — Ambulatory Visit: Payer: Medicaid Other | Attending: Internal Medicine | Admitting: Internal Medicine

## 2014-06-22 ENCOUNTER — Encounter: Payer: Self-pay | Admitting: Internal Medicine

## 2014-06-22 VITALS — BP 148/90 | HR 73 | Temp 98.0°F | Resp 16 | Wt 156.6 lb

## 2014-06-22 DIAGNOSIS — F17210 Nicotine dependence, cigarettes, uncomplicated: Secondary | ICD-10-CM | POA: Insufficient documentation

## 2014-06-22 DIAGNOSIS — R609 Edema, unspecified: Secondary | ICD-10-CM

## 2014-06-22 DIAGNOSIS — H5442 Blindness, left eye, normal vision right eye: Secondary | ICD-10-CM | POA: Insufficient documentation

## 2014-06-22 DIAGNOSIS — R030 Elevated blood-pressure reading, without diagnosis of hypertension: Secondary | ICD-10-CM

## 2014-06-22 DIAGNOSIS — IMO0001 Reserved for inherently not codable concepts without codable children: Secondary | ICD-10-CM

## 2014-06-22 DIAGNOSIS — B182 Chronic viral hepatitis C: Secondary | ICD-10-CM | POA: Diagnosis not present

## 2014-06-22 DIAGNOSIS — F319 Bipolar disorder, unspecified: Secondary | ICD-10-CM | POA: Diagnosis not present

## 2014-06-22 DIAGNOSIS — F329 Major depressive disorder, single episode, unspecified: Secondary | ICD-10-CM | POA: Diagnosis not present

## 2014-06-22 DIAGNOSIS — N451 Epididymitis: Secondary | ICD-10-CM | POA: Diagnosis not present

## 2014-06-22 DIAGNOSIS — F17200 Nicotine dependence, unspecified, uncomplicated: Secondary | ICD-10-CM

## 2014-06-22 DIAGNOSIS — K7031 Alcoholic cirrhosis of liver with ascites: Secondary | ICD-10-CM

## 2014-06-22 DIAGNOSIS — R600 Localized edema: Secondary | ICD-10-CM | POA: Diagnosis not present

## 2014-06-22 DIAGNOSIS — D696 Thrombocytopenia, unspecified: Secondary | ICD-10-CM | POA: Diagnosis not present

## 2014-06-22 DIAGNOSIS — Z720 Tobacco use: Secondary | ICD-10-CM

## 2014-06-22 NOTE — Patient Instructions (Signed)
Smoking Cessation Quitting smoking is important to your health and has many advantages. However, it is not always easy to quit since nicotine is a very addictive drug. Oftentimes, people try 3 times or more before being able to quit. This document explains the best ways for you to prepare to quit smoking. Quitting takes hard work and a lot of effort, but you can do it. ADVANTAGES OF QUITTING SMOKING  You will live longer, feel better, and live better.  Your body will feel the impact of quitting smoking almost immediately.  Within 20 minutes, blood pressure decreases. Your pulse returns to its normal level.  After 8 hours, carbon monoxide levels in the blood return to normal. Your oxygen level increases.  After 24 hours, the chance of having a heart attack starts to decrease. Your breath, hair, and body stop smelling like smoke.  After 48 hours, damaged nerve endings begin to recover. Your sense of taste and smell improve.  After 72 hours, the body is virtually free of nicotine. Your bronchial tubes relax and breathing becomes easier.  After 2 to 12 weeks, lungs can hold more air. Exercise becomes easier and circulation improves.  The risk of having a heart attack, stroke, cancer, or lung disease is greatly reduced.  After 1 year, the risk of coronary heart disease is cut in half.  After 5 years, the risk of stroke falls to the same as a nonsmoker.  After 10 years, the risk of lung cancer is cut in half and the risk of other cancers decreases significantly.  After 15 years, the risk of coronary heart disease drops, usually to the level of a nonsmoker.  If you are pregnant, quitting smoking will improve your chances of having a healthy baby.  The people you live with, especially any children, will be healthier.  You will have extra money to spend on things other than cigarettes. QUESTIONS TO THINK ABOUT BEFORE ATTEMPTING TO QUIT You may want to talk about your answers with your  health care provider.  Why do you want to quit?  If you tried to quit in the past, what helped and what did not?  What will be the most difficult situations for you after you quit? How will you plan to handle them?  Who can help you through the tough times? Your family? Friends? A health care provider?  What pleasures do you get from smoking? What ways can you still get pleasure if you quit? Here are some questions to ask your health care provider:  How can you help me to be successful at quitting?  What medicine do you think would be best for me and how should I take it?  What should I do if I need more help?  What is smoking withdrawal like? How can I get information on withdrawal? GET READY  Set a quit date.  Change your environment by getting rid of all cigarettes, ashtrays, matches, and lighters in your home, car, or work. Do not let people smoke in your home.  Review your past attempts to quit. Think about what worked and what did not. GET SUPPORT AND ENCOURAGEMENT You have a better chance of being successful if you have help. You can get support in many ways.  Tell your family, friends, and coworkers that you are going to quit and need their support. Ask them not to smoke around you.  Get individual, group, or telephone counseling and support. Programs are available at local hospitals and health centers. Call   your local health department for information about programs in your area.  Spiritual beliefs and practices may help some smokers quit.  Download a "quit meter" on your computer to keep track of quit statistics, such as how long you have gone without smoking, cigarettes not smoked, and money saved.  Get a self-help book about quitting smoking and staying off tobacco. LEARN NEW SKILLS AND BEHAVIORS  Distract yourself from urges to smoke. Talk to someone, go for a walk, or occupy your time with a task.  Change your normal routine. Take a different route to work.  Drink tea instead of coffee. Eat breakfast in a different place.  Reduce your stress. Take a hot bath, exercise, or read a book.  Plan something enjoyable to do every day. Reward yourself for not smoking.  Explore interactive web-based programs that specialize in helping you quit. GET MEDICINE AND USE IT CORRECTLY Medicines can help you stop smoking and decrease the urge to smoke. Combining medicine with the above behavioral methods and support can greatly increase your chances of successfully quitting smoking.  Nicotine replacement therapy helps deliver nicotine to your body without the negative effects and risks of smoking. Nicotine replacement therapy includes nicotine gum, lozenges, inhalers, nasal sprays, and skin patches. Some may be available over-the-counter and others require a prescription.  Antidepressant medicine helps people abstain from smoking, but how this works is unknown. This medicine is available by prescription.  Nicotinic receptor partial agonist medicine simulates the effect of nicotine in your brain. This medicine is available by prescription. Ask your health care provider for advice about which medicines to use and how to use them based on your health history. Your health care provider will tell you what side effects to look out for if you choose to be on a medicine or therapy. Carefully read the information on the package. Do not use any other product containing nicotine while using a nicotine replacement product.  RELAPSE OR DIFFICULT SITUATIONS Most relapses occur within the first 3 months after quitting. Do not be discouraged if you start smoking again. Remember, most people try several times before finally quitting. You may have symptoms of withdrawal because your body is used to nicotine. You may crave cigarettes, be irritable, feel very hungry, cough often, get headaches, or have difficulty concentrating. The withdrawal symptoms are only temporary. They are strongest  when you first quit, but they will go away within 10-14 days. To reduce the chances of relapse, try to:  Avoid drinking alcohol. Drinking lowers your chances of successfully quitting.  Reduce the amount of caffeine you consume. Once you quit smoking, the amount of caffeine in your body increases and can give you symptoms, such as a rapid heartbeat, sweating, and anxiety.  Avoid smokers because they can make you want to smoke.  Do not let weight gain distract you. Many smokers will gain weight when they quit, usually less than 10 pounds. Eat a healthy diet and stay active. You can always lose the weight gained after you quit.  Find ways to improve your mood other than smoking. FOR MORE INFORMATION  www.smokefree.gov  Document Released: 01/09/2001 Document Revised: 06/01/2013 Document Reviewed: 04/26/2011 ExitCare Patient Information 2015 [employer]. This information is not intended to replace advice given to you by your health care provider. Make sure you discuss any questions you have with your health care provider. DASH Eating Plan DASH stands for "Dietary Approaches to Stop Hypertension." The DASH eating plan is a healthy eating plan that has   been shown to reduce high blood pressure (hypertension). Additional health benefits may include reducing the risk of type 2 diabetes mellitus, heart disease, and stroke. The DASH eating plan may also help with weight loss. WHAT DO I NEED TO KNOW ABOUT THE DASH EATING PLAN? For the DASH eating plan, you will follow these general guidelines:  Choose foods with a percent daily value for sodium of less than 5% (as listed on the food label).  Use salt-free seasonings or herbs instead of table salt or sea salt.  Check with your health care provider or pharmacist before using salt substitutes.  Eat lower-sodium products, often labeled as "lower sodium" or "no salt added."  Eat fresh foods.  Eat more vegetables, fruits, and low-fat dairy  products.  Choose whole grains. Look for the word "whole" as the first word in the ingredient list.  Choose fish and skinless chicken or turkey more often than red meat. Limit fish, poultry, and meat to 6 oz (170 g) each day.  Limit sweets, desserts, sugars, and sugary drinks.  Choose heart-healthy fats.  Limit cheese to 1 oz (28 g) per day.  Eat more home-cooked food and less restaurant, buffet, and fast food.  Limit fried foods.  Cook foods using methods other than frying.  Limit canned vegetables. If you do use them, rinse them well to decrease the sodium.  When eating at a restaurant, ask that your food be prepared with less salt, or no salt if possible. WHAT FOODS CAN I EAT? Seek help from a dietitian for individual calorie needs. Grains Whole grain or whole wheat bread. Brown rice. Whole grain or whole wheat pasta. Quinoa, bulgur, and whole grain cereals. Low-sodium cereals. Corn or whole wheat flour tortillas. Whole grain cornbread. Whole grain crackers. Low-sodium crackers. Vegetables Fresh or frozen vegetables (raw, steamed, roasted, or grilled). Low-sodium or reduced-sodium tomato and vegetable juices. Low-sodium or reduced-sodium tomato sauce and paste. Low-sodium or reduced-sodium canned vegetables.  Fruits All fresh, canned (in natural juice), or frozen fruits. Meat and Other Protein Products Ground beef (85% or leaner), grass-fed beef, or beef trimmed of fat. Skinless chicken or turkey. Ground chicken or turkey. Pork trimmed of fat. All fish and seafood. Eggs. Dried beans, peas, or lentils. Unsalted nuts and seeds. Unsalted canned beans. Dairy Low-fat dairy products, such as skim or 1% milk, 2% or reduced-fat cheeses, low-fat ricotta or cottage cheese, or plain low-fat yogurt. Low-sodium or reduced-sodium cheeses. Fats and Oils Tub margarines without trans fats. Light or reduced-fat mayonnaise and salad dressings (reduced sodium). Avocado. Safflower, olive, or canola  oils. Natural peanut or almond butter. Other Unsalted popcorn and pretzels. The items listed above may not be a complete list of recommended foods or beverages. Contact your dietitian for more options. WHAT FOODS ARE NOT RECOMMENDED? Grains White bread. White pasta. White rice. Refined cornbread. Bagels and croissants. Crackers that contain trans fat. Vegetables Creamed or fried vegetables. Vegetables in a cheese sauce. Regular canned vegetables. Regular canned tomato sauce and paste. Regular tomato and vegetable juices. Fruits Dried fruits. Canned fruit in light or heavy syrup. Fruit juice. Meat and Other Protein Products Fatty cuts of meat. Ribs, chicken wings, bacon, sausage, bologna, salami, chitterlings, fatback, hot dogs, bratwurst, and packaged luncheon meats. Salted nuts and seeds. Canned beans with salt. Dairy Whole or 2% milk, cream, half-and-half, and cream cheese. Whole-fat or sweetened yogurt. Full-fat cheeses or blue cheese. Nondairy creamers and whipped toppings. Processed cheese, cheese spreads, or cheese curds. Condiments Onion and garlic salt,   seasoned salt, table salt, and sea salt. Canned and packaged gravies. Worcestershire sauce. Tartar sauce. Barbecue sauce. Teriyaki sauce. Soy sauce, including reduced sodium. Steak sauce. Fish sauce. Oyster sauce. Cocktail sauce. Horseradish. Ketchup and mustard. Meat flavorings and tenderizers. Bouillon cubes. Hot sauce. Tabasco sauce. Marinades. Taco seasonings. Relishes. Fats and Oils Butter, stick margarine, lard, shortening, ghee, and bacon fat. Coconut, palm kernel, or palm oils. Regular salad dressings. Other Pickles and olives. Salted popcorn and pretzels. The items listed above may not be a complete list of foods and beverages to avoid. Contact your dietitian for more information. WHERE CAN I FIND MORE INFORMATION? National Heart, Lung, and Blood Institute: www.nhlbi.nih.gov/health/health-topics/topics/dash/ Document Released:  01/04/2011 Document Revised: 06/01/2013 Document Reviewed: 11/19/2012 ExitCare Patient Information 2015 [employer]. This information is not intended to replace advice given to you by your health care provider. Make sure you discuss any questions you have with your health care provider.  

## 2014-06-22 NOTE — Progress Notes (Signed)
Patient here for follow up from the ED Was seen for swollen abdomen Currently on doxycycline ABT

## 2014-06-22 NOTE — Progress Notes (Signed)
MRN: [id number] Name: Albert Salazar  Sex: male Age: 59 y.o. DOB: 1955/11/29  Allergies: Review of patient's allergies indicates no known allergies.  Chief Complaint  Patient presents with  . Follow-up    HPI: Patient is 59 y.o. male who History of alcohol abuse hepatitis C, liver cirrhosis with ascites, history of polysubstance abuse, recently hospitalized with symptoms of lower extremity edema as well as scrotal swelling, EMR reviewed patient had been drinking alcohol, patient was started on Lasix as well as Paulette on his swelling improved patient was also diagnosed with left epididymitis was prescribed doxycycline which as per patient is taking and he was advised to follow with urology, patient is also following up her with infectious disease already has scheduled appointment, currently as per patient he has quit drinking alcohol, as blood pressure is borderline elevated, denies any headache dizziness chest and shortness of breath, he is still smoking cigarettes, I have counseled patient to quit smoking.patient currently denies any urinary symptoms denies any change in bowel habits.  Past Medical History  Diagnosis Date  . Blindness of left eye   . Hepatitis C   . Cirrhosis of liver   . Polysubstance abuse   . Depression   . Bipolar disorder     Past Surgical History  Procedure Laterality Date  . Eye surgery        Medication List       This list is accurate as of: 06/22/14 11:03 AM.  Always use your most recent med list.               doxycycline 100 MG tablet  Commonly known as:  VIBRA-TABS  Take 1 tablet (100 mg total) by mouth every 12 (twelve) hours.     FLUoxetine 10 MG capsule  Commonly known as:  PROZAC  Take 1 capsule (10 mg total) by mouth daily.     folic acid 1 MG tablet  Commonly known as:  FOLVITE  Take 1 tablet (1 mg total) by mouth daily.     furosemide 40 MG tablet  Commonly known as:  LASIX  Take 1 tablet (40 mg total) by mouth daily.       ibuprofen 200 MG tablet  Commonly known as:  ADVIL,MOTRIN  Take 2 tablets (400 mg total) by mouth every 6 (six) hours as needed for moderate pain.     multivitamin with minerals Tabs tablet  Take 1 tablet by mouth daily.     spironolactone 100 MG tablet  Commonly known as:  ALDACTONE  Take 1 tablet (100 mg total) by mouth daily.     thiamine 100 MG tablet  Take 1 tablet (100 mg total) by mouth daily.     VISINE EXTRA OP  Apply 1-2 drops to eye daily as needed (redness.).        No orders of the defined types were placed in this encounter.     There is no immunization history on file for this patient.  Family History  Problem Relation Age of Onset  . Diabetes Brother     History  Substance Use Topics  . Smoking status: Current Every Day Smoker -- 1.00 packs/day for 40 years    Types: Cigarettes  . Smokeless tobacco: Never Used  . Alcohol Use: 4.8 oz/week    8 Cans of beer, 0 Standard drinks or equivalent per week    Review of Systems   As noted in HPI  Filed Vitals:   06/22/14 1032  BP:  148/90  Pulse: 73  Temp: 98 F (36.7 C)  Resp: 16    Physical Exam  Physical Exam  Constitutional: No distress.  Eyes:  Blind in the left eye  Cardiovascular: Normal rate and regular rhythm.   Abdominal: There is no tenderness. There is no rebound and no guarding.   Minimal scrotal swelling  Musculoskeletal:   trace pedal edema    CBC    Component Value Date/Time   WBC 4.9 06/13/2014 1204   RBC 3.36* 06/13/2014 1204   HGB 11.0* 06/13/2014 1204   HCT 33.0* 06/13/2014 1204   PLT 40* 06/13/2014 1204   MCV 98.2 06/13/2014 1204   LYMPHSABS 2.3 06/13/2014 1204   MONOABS 0.5 06/13/2014 1204   EOSABS 0.0 06/13/2014 1204   BASOSABS 0.0 06/13/2014 1204    CMP     Component Value Date/Time   NA 133* 06/15/2014 0437   K 3.4* 06/15/2014 0437   CL 99* 06/15/2014 0437   CO2 28 06/15/2014 0437   GLUCOSE 126* 06/15/2014 0437   BUN <5* 06/15/2014 0437    CREATININE 0.89 06/15/2014 0437   CREATININE 0.82 04/21/2014 1037   CALCIUM 8.1* 06/15/2014 0437   PROT 7.3 06/13/2014 1204   ALBUMIN 2.4* 06/13/2014 1204   AST 125* 06/13/2014 1204   ALT 72* 06/13/2014 1204   ALKPHOS 226* 06/13/2014 1204   BILITOT 0.8 06/13/2014 1204   GFRNONAA >60 06/15/2014 0437   GFRNONAA >89 04/21/2014 1037   GFRAA >60 06/15/2014 0437   GFRAA >89 04/21/2014 1037    No results found for: CHOL  Lab Results  Component Value Date/Time   HGBA1C 5.7* 04/21/2014 10:37 AM    Lab Results  Component Value Date/Time   AST 125* 06/13/2014 12:04 PM    Assessment and Plan  Chronic hepatitis C without hepatic coma/Alcoholic cirrhosis of liver with ascites/Peripheral edema Patient is currently on Lasix as well as spironolactone, ascites and edema is improved, patient is also scheduled to follow up with ID. Has a scheduled ultrasound next month.recheck blood chemistry on the following visit.  Thrombocytopenia Likely secondary to liver cirrhosis,  denies any bleeding, will check CBC on the following visit  Left epididymitis - Plan: Patient has been treated with doxycycline Ambulatory referral to Urology   Elevated BP Patient is currently on Lasix as well as power tone, advise patient for DASH diet, reevaluate on the next visit  Smoking Counseled patient to quit smoking.   Return in about 3 months (around 09/22/2014).   This note has been created with Education officer, environmentalDragon speech recognition software and smart phrase technology. Any transcriptional errors are unintentional.    Doris CheadleADVANI, Shandee Jergens, MD

## 2014-06-23 ENCOUNTER — Ambulatory Visit: Payer: Medicaid Other | Admitting: *Deleted

## 2014-06-23 DIAGNOSIS — F1010 Alcohol abuse, uncomplicated: Secondary | ICD-10-CM
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
## 2014-06-29 ENCOUNTER — Telehealth (HOSPITAL_COMMUNITY): Payer: Self-pay

## 2014-06-29 NOTE — Telephone Encounter (Signed)
Called to remind pt of 9:00am appt on 06/30/14 in ultrasound. Pt did not answer, and there was no voicemail set up yet. AW

## 2014-06-30 ENCOUNTER — Ambulatory Visit (HOSPITAL_COMMUNITY): Payer: Medicaid Other

## 2014-07-06 ENCOUNTER — Ambulatory Visit: Payer: Self-pay | Admitting: *Deleted

## 2014-07-07 ENCOUNTER — Ambulatory Visit: Payer: Self-pay | Admitting: *Deleted

## 2014-07-08 ENCOUNTER — Ambulatory Visit (HOSPITAL_COMMUNITY): Payer: Medicaid Other

## 2014-07-09 ENCOUNTER — Other Ambulatory Visit (HOSPITAL_COMMUNITY): Payer: Medicaid Other

## 2014-07-13 ENCOUNTER — Other Ambulatory Visit: Payer: Self-pay | Admitting: Internal Medicine

## 2014-07-13 ENCOUNTER — Telehealth: Payer: Self-pay

## 2014-07-13 MED ORDER — FUROSEMIDE 40 MG PO TABS
40.0000 mg | ORAL_TABLET | Freq: Every day | ORAL | Status: DC
Start: 2014-07-13 — End: 2014-09-18

## 2014-07-13 NOTE — Telephone Encounter (Signed)
Patient was prescribed lasix in the hospital and has since finished his prescription Patient is asking if this need to be continued and if so can we refill this for him

## 2014-07-13 NOTE — Telephone Encounter (Signed)
If patient has swelling in his legs, he should continue with the medication. He will also need to do blood chemistry in a  month time to check electrolytes.

## 2014-07-13 NOTE — Telephone Encounter (Signed)
Spoke with patient  New prescription for lasix sent to pharmacy and patient has  Scheduled an appointment for blood work

## 2014-07-14 ENCOUNTER — Other Ambulatory Visit: Payer: Self-pay

## 2014-07-15 ENCOUNTER — Ambulatory Visit: Payer: Medicaid Other | Attending: Internal Medicine

## 2014-07-15 DIAGNOSIS — Z8619 Personal history of other infectious and parasitic diseases: Secondary | ICD-10-CM

## 2014-07-15 LAB — HEPATITIS B SURFACE ANTIGEN: Hepatitis B Surface Ag: NEGATIVE

## 2014-07-15 LAB — HEPATITIS C ANTIBODY: HCV AB: REACTIVE — AB

## 2014-07-15 LAB — HEPATITIS B SURFACE ANTIBODY,QUALITATIVE: HEP B S AB: NEGATIVE

## 2014-07-16 ENCOUNTER — Other Ambulatory Visit: Payer: Self-pay | Admitting: Internal Medicine

## 2014-07-17 LAB — HEPATITIS C RNA QUANTITATIVE
HCV QUANT LOG: 5.95 {Log} — AB (ref <1.18)
HCV QUANT: 899402 [IU]/mL — AB (ref <15)

## 2014-07-20 ENCOUNTER — Telehealth: Payer: Self-pay

## 2014-07-20 MED ORDER — SPIRONOLACTONE 100 MG PO TABS
100.0000 mg | ORAL_TABLET | Freq: Every day | ORAL | Status: DC
Start: 2014-07-20 — End: 2014-09-18

## 2014-07-20 NOTE — Telephone Encounter (Signed)
Patient called requesting a refill on his aldactone Prescription sent to pharmacy on file

## 2014-07-21 ENCOUNTER — Ambulatory Visit: Payer: Medicaid Other | Admitting: *Deleted

## 2014-07-21 ENCOUNTER — Telehealth (HOSPITAL_COMMUNITY): Payer: Self-pay

## 2014-07-21 DIAGNOSIS — F1010 Alcohol abuse, uncomplicated: Secondary | ICD-10-CM

## 2014-07-21 NOTE — Telephone Encounter (Signed)
Called to remind pt of 8am appt in ultrasound at [facility]. Pt agreed to arrive at 745 and stay npo. AW
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
## 2014-07-22 ENCOUNTER — Ambulatory Visit (HOSPITAL_COMMUNITY)
Admission: RE | Admit: 2014-07-22 | Discharge: 2014-07-22 | Disposition: A | Discharge status: Home / Self Care | Payer: Medicaid Other | Source: Ambulatory Visit | Attending: Internal Medicine | Admitting: Internal Medicine

## 2014-07-22 DIAGNOSIS — B182 Chronic viral hepatitis C: Secondary | ICD-10-CM | POA: Diagnosis not present

## 2014-07-27 ENCOUNTER — Ambulatory Visit: Payer: Self-pay | Admitting: *Deleted

## 2014-08-03 ENCOUNTER — Ambulatory Visit: Payer: Self-pay | Admitting: Internal Medicine

## 2014-08-11 ENCOUNTER — Ambulatory Visit: Payer: Self-pay | Admitting: *Deleted

## 2014-09-06 ENCOUNTER — Ambulatory Visit: Payer: Self-pay | Admitting: Internal Medicine

## 2014-09-16 ENCOUNTER — Telehealth: Payer: Self-pay | Admitting: Lab

## 2014-09-16 NOTE — Telephone Encounter (Signed)
I informed patient of Dr's request for him to see Albert Salazar before continuing his treatment wiih Dr Steele Berg said that he has seen Albert Salazar on numerous occasions and she serves him no purpose. Regarding the missed appointment with Dr Luciana Axe on 09/06/14-he said that he thought his appointment was in September but he doesn't need to talk with Dr. Luciana Axe anymore either.

## 2014-09-16 NOTE — Telephone Encounter (Signed)
-----   Message from Michelle M Howell, RN sent at 09/06/2014  5:06 PM EDT ----- °Regarding: hcv no show °No need to reschedule today's missed visit unless the patient is in substance abuse counseling per Dr. Comer. °Thanks! ° Michelle ° °

## 2014-09-16 NOTE — Telephone Encounter (Signed)
-----   Message from Andree Coss, RN sent at 09/06/2014  5:06 PM EDT ----- Regarding: hcv no show No need to reschedule today's missed visit unless the patient is in substance abuse counseling per Dr. Luciana Axe. Thanks!  Marcelino Duster

## 2014-09-18 ENCOUNTER — Other Ambulatory Visit: Payer: Self-pay | Admitting: Internal Medicine

## 2014-10-08 ENCOUNTER — Ambulatory Visit: Payer: Self-pay | Admitting: Family Medicine

## 2015-01-25 NOTE — Telephone Encounter (Signed)
Refill error

## 2015-12-23 ENCOUNTER — Encounter (HOSPITAL_COMMUNITY): Payer: Self-pay | Admitting: Nurse Practitioner

## 2015-12-23 ENCOUNTER — Emergency Department (HOSPITAL_COMMUNITY)
Admission: EM | Admit: 2015-12-23 | Discharge: 2015-12-24 | Disposition: A | Discharge status: Home / Self Care | Payer: Medicaid Other | Attending: Dermatology | Admitting: Dermatology

## 2015-12-23 ENCOUNTER — Emergency Department (HOSPITAL_COMMUNITY): Payer: Medicaid Other

## 2015-12-23 DIAGNOSIS — Z5321 Procedure and treatment not carried out due to patient leaving prior to being seen by health care provider: Secondary | ICD-10-CM | POA: Diagnosis not present

## 2015-12-23 DIAGNOSIS — R079 Chest pain, unspecified: Secondary | ICD-10-CM | POA: Diagnosis not present

## 2015-12-23 LAB — CBC
HEMATOCRIT: 39.1 % (ref 39.0–52.0)
Hemoglobin: 13 g/dL (ref 13.0–17.0)
MCH: 31.7 pg (ref 26.0–34.0)
MCHC: 33.2 g/dL (ref 30.0–36.0)
MCV: 95.4 fL (ref 78.0–100.0)
PLATELETS: 78 10*3/uL — AB (ref 150–400)
RBC: 4.1 MIL/uL — AB (ref 4.22–5.81)
RDW: 13.4 % (ref 11.5–15.5)
WBC: 6.7 10*3/uL (ref 4.0–10.5)

## 2015-12-23 LAB — I-STAT TROPONIN, ED: Troponin i, poc: 0 ng/mL (ref 0.00–0.08)

## 2015-12-23 LAB — BASIC METABOLIC PANEL
Anion gap: 10 (ref 5–15)
BUN: 9 mg/dL (ref 6–20)
CO2: 24 mmol/L (ref 22–32)
CREATININE: 0.97 mg/dL (ref 0.61–1.24)
Calcium: 8.6 mg/dL — ABNORMAL LOW (ref 8.9–10.3)
Chloride: 106 mmol/L (ref 101–111)
GFR calc non Af Amer: >60 mL/min (ref >60)
Glucose, Bld: 132 mg/dL — ABNORMAL HIGH (ref 65–99)
POTASSIUM: 4.2 mmol/L (ref 3.5–5.1)
SODIUM: 140 mmol/L (ref 135–145)

## 2015-12-23 NOTE — ED Triage Notes (Addendum)
Per ems: pt called for CP. The pain began after waking this morning and has gotten worse throughout the day. He reports lightheadedness and SOB. He tried to take tums with no relief. He admits to drinking a 40oz beer today. En route ems gave him 1 nitro and 324 asa. The nitro decreased his cp from an 8/10 to a 0/10. BP decreased from 163/102 to 150 systolic after ntiro. cbg 183 en route.

## 2015-12-23 NOTE — ED Notes (Signed)
Pt came to me advising he was leaving and for me to "take this damn thing out of my arm".  Brought pt back to triage and advised him we could discuss him leaving.  Martin EMT came into room and we were trying to explain to pt the importance in staying and seeing the doctor. Pt jumped out of chair and begain cussing and stating "i'm fucking leaving".  IV was removed by Daphine DeutscherMartin EMT and vitals were taken again.  Pt calmed down and talking to pt now.

## 2015-12-23 NOTE — ED Notes (Addendum)
Both myself and Thayer OhmChris (EMT) spoke to PT about staying due to complaint. PT agreed to stay. Removed IV. PT stated that he was going to go for a smoke.

## 2015-12-23 NOTE — ED Notes (Signed)
Pt is walking to street and advised he is going to smoke and he will be back.  He stated "I will stay"

## 2015-12-24 NOTE — ED Notes (Signed)
Called Pt times 3 to reassess vitals. No Answer.

## 2015-12-24 NOTE — ED Triage Notes (Signed)
No answer in waITING ROOM X 2

## 2015-12-24 NOTE — ED Notes (Signed)
Called for Pt. No Answer 

## 2015-12-24 NOTE — ED Notes (Signed)
Called Pt to take to RM No Answer times 3.

## 2016-05-13 IMAGING — US US ABDOMEN COMPLETE W/ ELASTOGRAPHY
1 series · 13 of 16 positions shown · non-contrast
Comparison: 06/13/2014

CLINICAL DATA: Chronic hepatitis-C without hepatic coma.  Ascites.



[Series 1: us abdomen complete w/ elastography · 0.15mm/px · 13 of 16 slices shown]
[im 1/16]
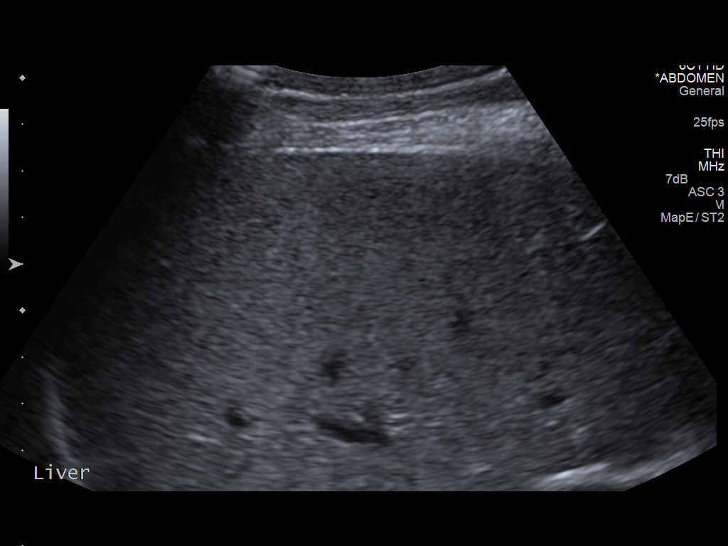
[im 2/16]
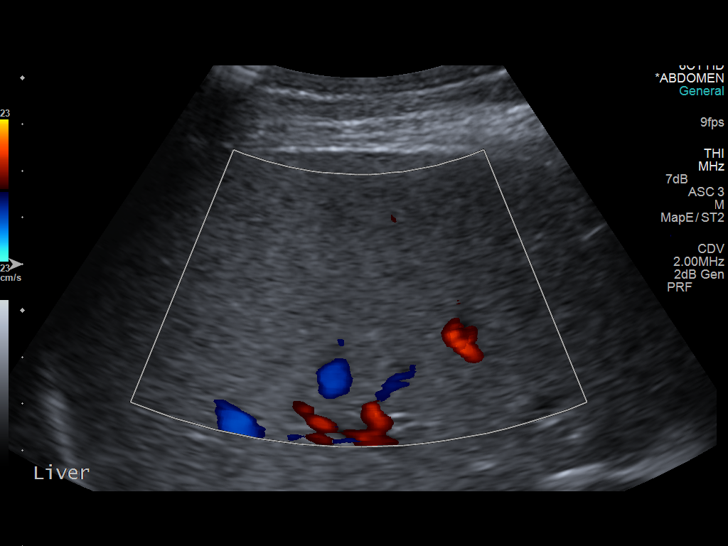
[im 4/16]
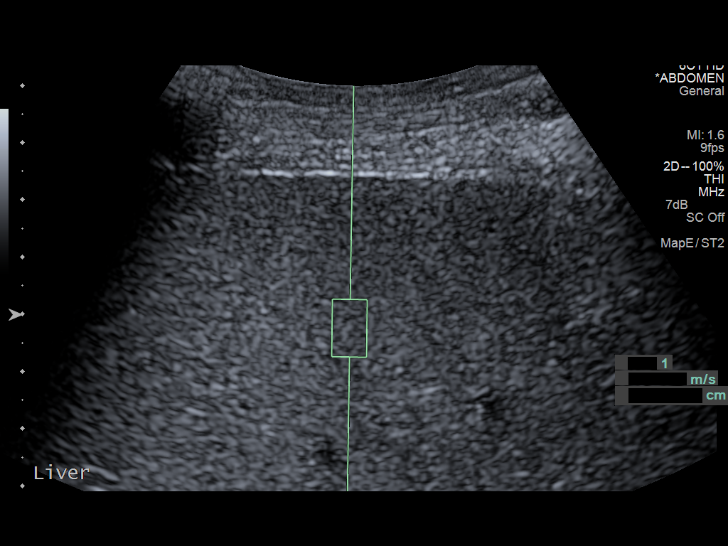
[im 5/16]
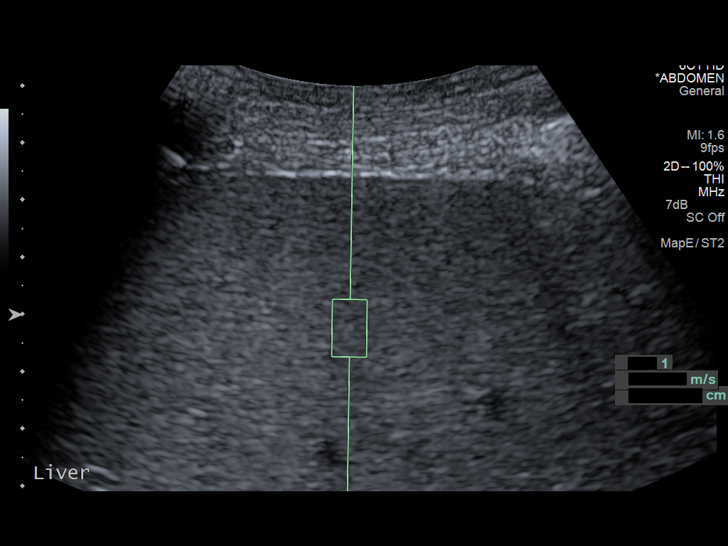
[im 6/16]
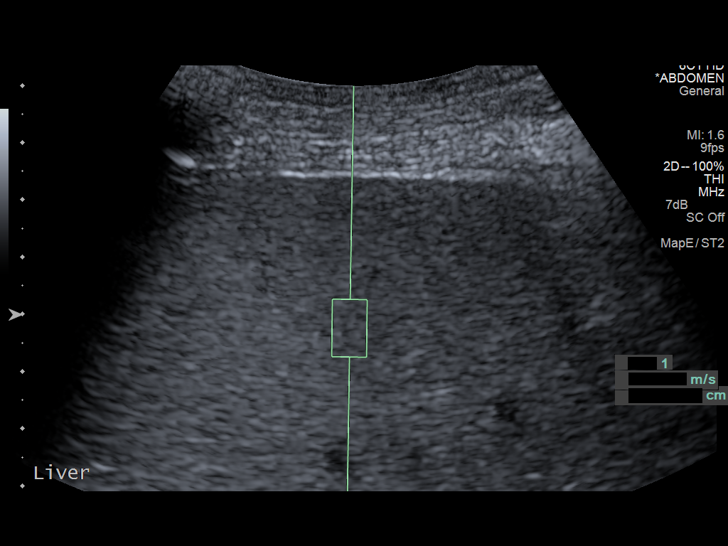
[im 7/16]
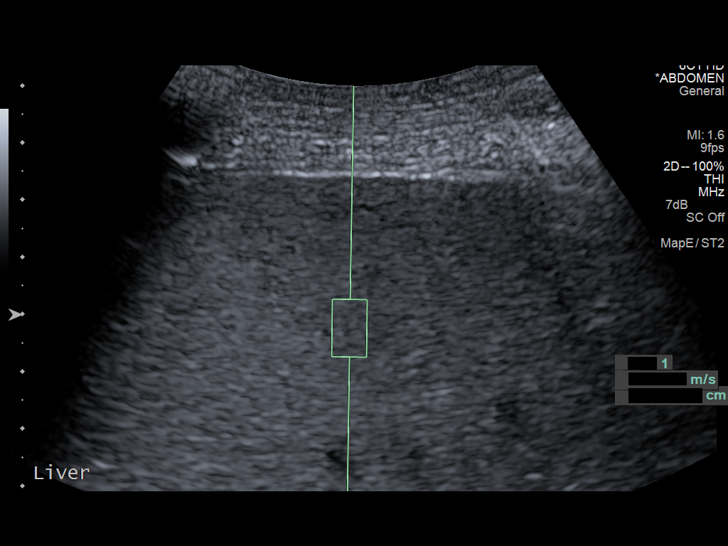
[im 9/16]
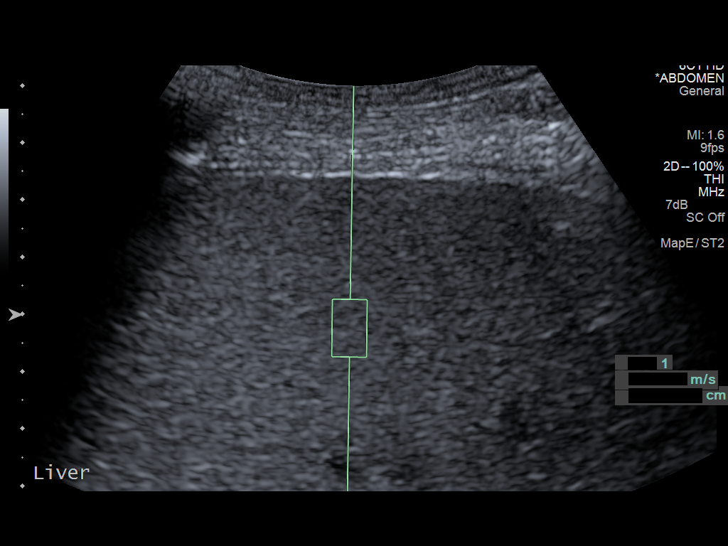
[im 10/16]
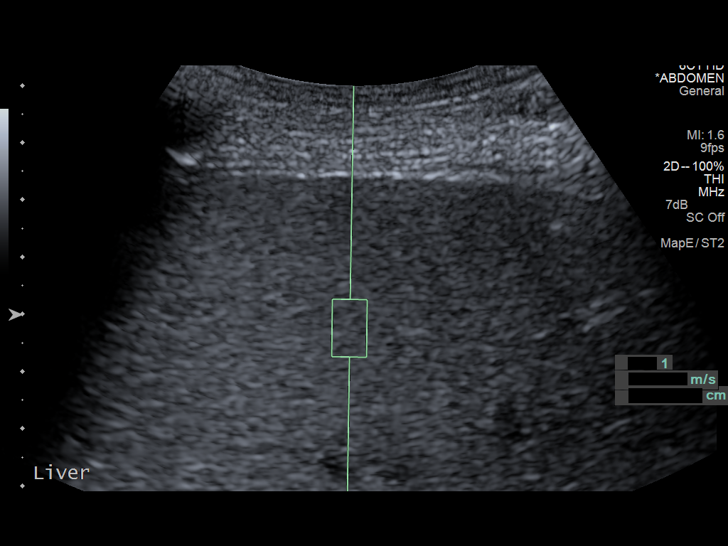
[im 11/16]
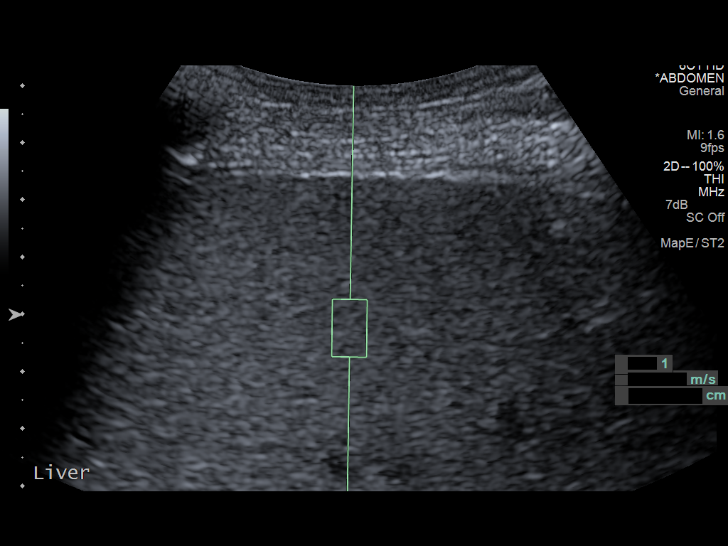
[im 12/16]
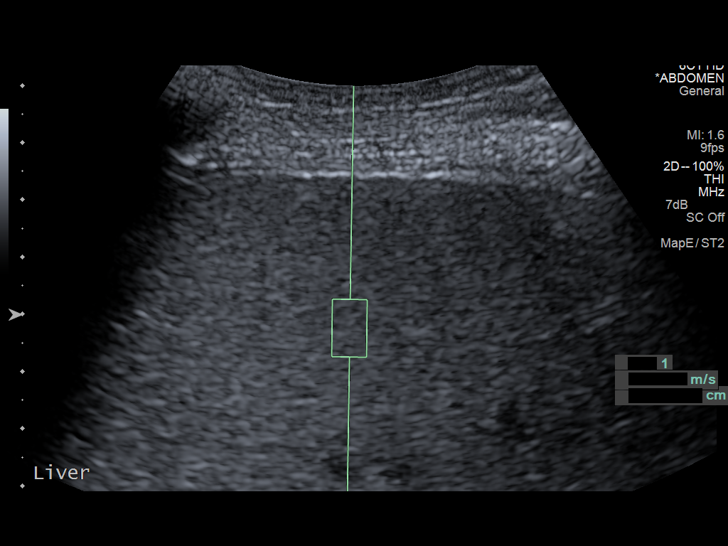
[im 13/16]
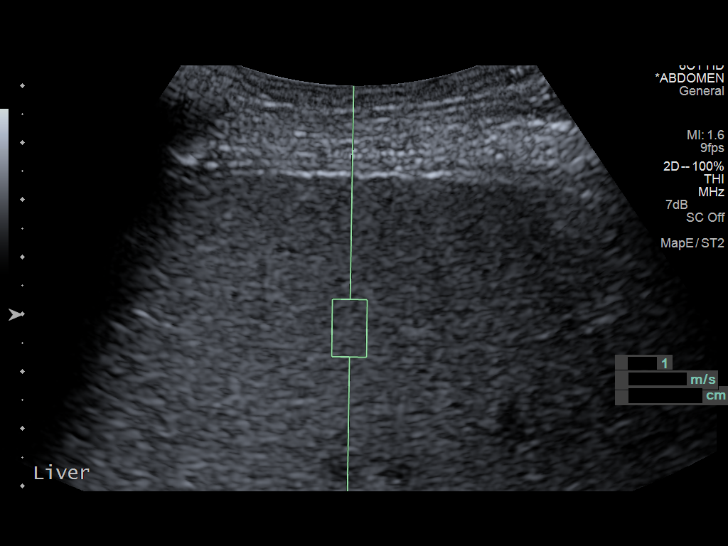
[im 15/16]
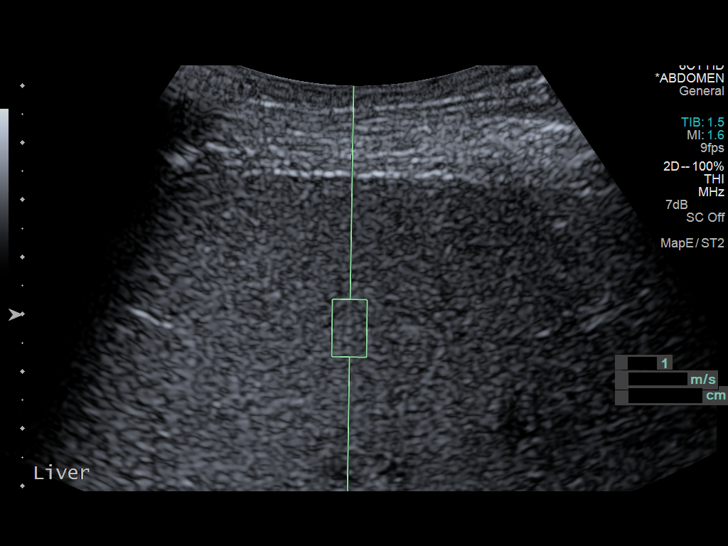
[im 16/16]
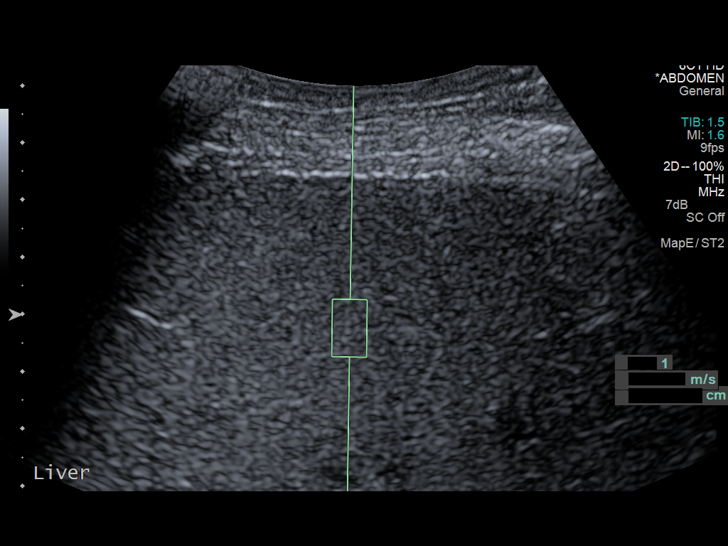

[13 of 16 positions shown; findings below may reference images not displayed]

FINDINGS: ULTRASOUND ABDOMEN

Gallbladder: No gallstones or wall thickening visualized. No
sonographic Murphy sign noted.

Common bile duct: Diameter: 3 mm, within normal limits.

Liver: No focal lesion identified. Within normal limits in
parenchymal echogenicity.

IVC: No abnormality visualized.

Pancreas: Visualized portion unremarkable.

Spleen: Size and appearance within normal limits.

Right Kidney: Length: 11.1 cm. Echogenicity within normal limits. No
mass or hydronephrosis visualized.

Left Kidney: Length: 11.4 cm. Echogenicity within normal limits. No
mass or hydronephrosis visualized.

Abdominal aorta: No aneurysm visualized. Atherosclerotic plaque
noted.

Other findings: Ascites has resolved since previous study.

ULTRASOUND HEPATIC ELASTOGRAPHY

Device: Siemens Helix VTQ

Transducer 6C 1

Patient position: Left lateral decubitus

Number of measurements:  10

Hepatic Segment:  8

Median velocity:   3.59  m/sec

IQR:

IQR/Median velocity ratio

Corresponding Metavir fibrosis score:  Some F3 +F4

Risk of fibrosis: High

Limitations of exam: None

Pertinent findings noted on other imaging exams:  None

Please note that abnormal shear wave velocities may also be
identified in clinical settings other than with hepatic fibrosis,
such as: acute hepatitis, elevated right heart and central venous
pressures including use of beta blockers, Kagero disease
(Bambucafe), infiltrative processes such as
mastocytosis/amyloidosis/infiltrative tumor, extrahepatic
cholestasis, in the post-prandial state, and liver transplantation.
Correlation with patient history, laboratory data, and clinical
condition recommended.
IMPRESSION: No evidence of gallstones or biliary dilatation.

Unremarkable sonographic appearance of hepatic parenchyma. Ascites
has resolved since previous study.

Median hepatic shear wave velocity is calculated at 3.59 m/sec.

Corresponding Metavir fibrosis score is some F3 +F4.

Risk of fibrosis is high.

Follow-up:  Followup advised

## 2018-02-20 ENCOUNTER — Emergency Department (HOSPITAL_COMMUNITY): Payer: Medicaid Other

## 2018-02-20 ENCOUNTER — Encounter (HOSPITAL_COMMUNITY): Payer: Self-pay | Admitting: Emergency Medicine

## 2018-02-20 ENCOUNTER — Other Ambulatory Visit: Payer: Self-pay

## 2018-02-20 ENCOUNTER — Inpatient Hospital Stay (HOSPITAL_COMMUNITY)
Admission: EM | Admit: 2018-02-20 | Discharge: 2018-03-06 | DRG: 871 | Disposition: A | Discharge status: Home Health | Payer: Medicaid Other | Attending: Internal Medicine | Admitting: Internal Medicine

## 2018-02-20 DIAGNOSIS — H5462 Unqualified visual loss, left eye, normal vision right eye: Secondary | ICD-10-CM | POA: Diagnosis present

## 2018-02-20 DIAGNOSIS — R338 Other retention of urine: Secondary | ICD-10-CM | POA: Diagnosis present

## 2018-02-20 DIAGNOSIS — K7031 Alcoholic cirrhosis of liver with ascites: Secondary | ICD-10-CM | POA: Diagnosis present

## 2018-02-20 DIAGNOSIS — E872 Acidosis: Secondary | ICD-10-CM | POA: Diagnosis present

## 2018-02-20 DIAGNOSIS — E876 Hypokalemia: Secondary | ICD-10-CM | POA: Diagnosis not present

## 2018-02-20 DIAGNOSIS — D539 Nutritional anemia, unspecified: Secondary | ICD-10-CM | POA: Diagnosis present

## 2018-02-20 DIAGNOSIS — N432 Other hydrocele: Secondary | ICD-10-CM | POA: Diagnosis present

## 2018-02-20 DIAGNOSIS — D696 Thrombocytopenia, unspecified: Secondary | ICD-10-CM | POA: Diagnosis not present

## 2018-02-20 DIAGNOSIS — A4151 Sepsis due to Escherichia coli [E. coli]: Principal | ICD-10-CM | POA: Diagnosis present

## 2018-02-20 DIAGNOSIS — M7989 Other specified soft tissue disorders: Secondary | ICD-10-CM | POA: Diagnosis not present

## 2018-02-20 DIAGNOSIS — Z7189 Other specified counseling: Secondary | ICD-10-CM | POA: Diagnosis not present

## 2018-02-20 DIAGNOSIS — N4889 Other specified disorders of penis: Secondary | ICD-10-CM | POA: Diagnosis not present

## 2018-02-20 DIAGNOSIS — D638 Anemia in other chronic diseases classified elsewhere: Secondary | ICD-10-CM | POA: Diagnosis present

## 2018-02-20 DIAGNOSIS — G9340 Encephalopathy, unspecified: Secondary | ICD-10-CM | POA: Diagnosis present

## 2018-02-20 DIAGNOSIS — F319 Bipolar disorder, unspecified: Secondary | ICD-10-CM | POA: Diagnosis present

## 2018-02-20 DIAGNOSIS — J690 Pneumonitis due to inhalation of food and vomit: Secondary | ICD-10-CM | POA: Diagnosis not present

## 2018-02-20 DIAGNOSIS — D6959 Other secondary thrombocytopenia: Secondary | ICD-10-CM | POA: Diagnosis present

## 2018-02-20 DIAGNOSIS — Z833 Family history of diabetes mellitus: Secondary | ICD-10-CM

## 2018-02-20 DIAGNOSIS — J189 Pneumonia, unspecified organism: Secondary | ICD-10-CM

## 2018-02-20 DIAGNOSIS — F10239 Alcohol dependence with withdrawal, unspecified: Secondary | ICD-10-CM | POA: Diagnosis present

## 2018-02-20 DIAGNOSIS — F10939 Alcohol use, unspecified with withdrawal, unspecified: Secondary | ICD-10-CM | POA: Diagnosis present

## 2018-02-20 DIAGNOSIS — R4702 Dysphasia: Secondary | ICD-10-CM | POA: Diagnosis not present

## 2018-02-20 DIAGNOSIS — N401 Enlarged prostate with lower urinary tract symptoms: Secondary | ICD-10-CM | POA: Diagnosis present

## 2018-02-20 DIAGNOSIS — E878 Other disorders of electrolyte and fluid balance, not elsewhere classified: Secondary | ICD-10-CM | POA: Diagnosis not present

## 2018-02-20 DIAGNOSIS — R451 Restlessness and agitation: Secondary | ICD-10-CM | POA: Diagnosis present

## 2018-02-20 DIAGNOSIS — N390 Urinary tract infection, site not specified: Secondary | ICD-10-CM | POA: Diagnosis present

## 2018-02-20 DIAGNOSIS — B182 Chronic viral hepatitis C: Secondary | ICD-10-CM | POA: Diagnosis present

## 2018-02-20 DIAGNOSIS — R7881 Bacteremia: Secondary | ICD-10-CM | POA: Diagnosis present

## 2018-02-20 DIAGNOSIS — F17210 Nicotine dependence, cigarettes, uncomplicated: Secondary | ICD-10-CM | POA: Diagnosis present

## 2018-02-20 DIAGNOSIS — F10931 Alcohol use, unspecified with withdrawal delirium: Secondary | ICD-10-CM

## 2018-02-20 DIAGNOSIS — F1410 Cocaine abuse, uncomplicated: Secondary | ICD-10-CM | POA: Diagnosis present

## 2018-02-20 DIAGNOSIS — F1020 Alcohol dependence, uncomplicated: Secondary | ICD-10-CM | POA: Diagnosis present

## 2018-02-20 DIAGNOSIS — R0682 Tachypnea, not elsewhere classified: Secondary | ICD-10-CM | POA: Diagnosis not present

## 2018-02-20 DIAGNOSIS — A419 Sepsis, unspecified organism: Secondary | ICD-10-CM | POA: Diagnosis not present

## 2018-02-20 DIAGNOSIS — F10929 Alcohol use, unspecified with intoxication, unspecified: Secondary | ICD-10-CM | POA: Diagnosis present

## 2018-02-20 DIAGNOSIS — F10920 Alcohol use, unspecified with intoxication, uncomplicated: Secondary | ICD-10-CM | POA: Diagnosis not present

## 2018-02-20 DIAGNOSIS — F10231 Alcohol dependence with withdrawal delirium: Secondary | ICD-10-CM | POA: Diagnosis present

## 2018-02-20 DIAGNOSIS — Z515 Encounter for palliative care: Secondary | ICD-10-CM | POA: Diagnosis not present

## 2018-02-20 DIAGNOSIS — R6520 Severe sepsis without septic shock: Secondary | ICD-10-CM | POA: Diagnosis present

## 2018-02-20 DIAGNOSIS — N5089 Other specified disorders of the male genital organs: Secondary | ICD-10-CM | POA: Diagnosis not present

## 2018-02-20 DIAGNOSIS — R0602 Shortness of breath: Secondary | ICD-10-CM

## 2018-02-20 LAB — COMPREHENSIVE METABOLIC PANEL
ALT: 61 U/L — ABNORMAL HIGH (ref 0–44)
AST: 163 U/L — ABNORMAL HIGH (ref 15–41)
Albumin: 2.8 g/dL — ABNORMAL LOW (ref 3.5–5.0)
Alkaline Phosphatase: 104 U/L (ref 38–126)
Anion gap: 13 (ref 5–15)
BUN: 16 mg/dL (ref 6–20)
CO2: 23 mmol/L (ref 22–32)
Calcium: 8.3 mg/dL — ABNORMAL LOW (ref 8.9–10.3)
Chloride: 95 mmol/L — ABNORMAL LOW (ref 98–111)
Creatinine, Ser: 1.3 mg/dL — ABNORMAL HIGH (ref 0.61–1.24)
GFR calc Af Amer: >60 mL/min (ref >60)
GFR calc non Af Amer: 59 mL/min — ABNORMAL LOW (ref >60)
Glucose, Bld: 116 mg/dL — ABNORMAL HIGH (ref 70–99)
Potassium: 3.1 mmol/L — ABNORMAL LOW (ref 3.5–5.1)
Sodium: 131 mmol/L — ABNORMAL LOW (ref 135–145)
Total Bilirubin: 3.3 mg/dL — ABNORMAL HIGH (ref 0.3–1.2)
Total Protein: 9.1 g/dL — ABNORMAL HIGH (ref 6.5–8.1)

## 2018-02-20 LAB — HEPATIC FUNCTION PANEL
ALT: 53 U/L — ABNORMAL HIGH (ref 0–44)
AST: 187 U/L — ABNORMAL HIGH (ref 15–41)
Albumin: 2.4 g/dL — ABNORMAL LOW (ref 3.5–5.0)
Alkaline Phosphatase: 85 U/L (ref 38–126)
BILIRUBIN DIRECT: 1.3 mg/dL — AB (ref 0.0–0.2)
BILIRUBIN INDIRECT: 1.5 mg/dL — AB (ref 0.3–0.9)
TOTAL PROTEIN: 7.9 g/dL (ref 6.5–8.1)
Total Bilirubin: 2.8 mg/dL — ABNORMAL HIGH (ref 0.3–1.2)

## 2018-02-20 LAB — BLOOD GAS, ARTERIAL
Acid-base deficit: 2.5 mmol/L — ABNORMAL HIGH (ref 0.0–2.0)
Bicarbonate: 20.9 mmol/L (ref 20.0–28.0)
Drawn by: 235321
O2 Content: 2 L/min
O2 Saturation: 95.7 %
PCO2 ART: 32.9 mmHg (ref 32.0–48.0)
PO2 ART: 84 mmHg (ref 83.0–108.0)
Patient temperature: 98.6
pH, Arterial: 7.419 (ref 7.350–7.450)

## 2018-02-20 LAB — CBC WITH DIFFERENTIAL/PLATELET
Abs Immature Granulocytes: 0.03 10*3/uL (ref 0.00–0.07)
Basophils Absolute: 0 10*3/uL (ref 0.0–0.1)
Basophils Relative: 0 %
Eosinophils Absolute: 0 10*3/uL (ref 0.0–0.5)
Eosinophils Relative: 0 %
HCT: 39.2 % (ref 39.0–52.0)
Hemoglobin: 12.7 g/dL — ABNORMAL LOW (ref 13.0–17.0)
Immature Granulocytes: 1 %
Lymphocytes Relative: 21 %
Lymphs Abs: 1.3 10*3/uL (ref 0.7–4.0)
MCH: 33.7 pg (ref 26.0–34.0)
MCHC: 32.4 g/dL (ref 30.0–36.0)
MCV: 104 fL — ABNORMAL HIGH (ref 80.0–100.0)
Monocytes Absolute: 0.4 10*3/uL (ref 0.1–1.0)
Monocytes Relative: 6 %
Neutro Abs: 4.4 10*3/uL (ref 1.7–7.7)
Neutrophils Relative %: 72 %
Platelets: 19 10*3/uL — CL (ref 150–400)
RBC: 3.77 MIL/uL — ABNORMAL LOW (ref 4.22–5.81)
RDW: 15.6 % — ABNORMAL HIGH (ref 11.5–15.5)
WBC: 6.1 10*3/uL (ref 4.0–10.5)
nRBC: 0 % (ref 0.0–0.2)

## 2018-02-20 LAB — TYPE AND SCREEN
ABO/RH(D): O POS
Antibody Screen: NEGATIVE

## 2018-02-20 LAB — MRSA PCR SCREENING: MRSA by PCR: NEGATIVE

## 2018-02-20 LAB — LACTIC ACID, PLASMA
Lactic Acid, Venous: 4.1 mmol/L (ref 0.5–1.9)
Lactic Acid, Venous: 9.6 mmol/L (ref 0.5–1.9)

## 2018-02-20 LAB — PROTIME-INR
INR: 1.44
Prothrombin Time: 17.3 seconds — ABNORMAL HIGH (ref 11.4–15.2)

## 2018-02-20 MED ORDER — SODIUM CHLORIDE 0.9 % IV BOLUS (SEPSIS)
250.0000 mL | Freq: Once | INTRAVENOUS | Status: AC
  Administered 2018-02-20: 250 mL via INTRAVENOUS

## 2018-02-20 MED ORDER — FAMOTIDINE IN NACL 20-0.9 MG/50ML-% IV SOLN
20.0000 mg | Freq: Two times a day (BID) | INTRAVENOUS | Status: DC
  Administered 2018-02-20 – 2018-02-24 (×9): 20 mg via INTRAVENOUS
  Filled 2018-02-20 (×9): qty 50

## 2018-02-20 MED ORDER — SODIUM CHLORIDE 0.9 % IV SOLN
2.0000 g | Freq: Once | INTRAVENOUS | Status: AC
  Administered 2018-02-20: 2 g via INTRAVENOUS
  Filled 2018-02-20: qty 2

## 2018-02-20 MED ORDER — METRONIDAZOLE IN NACL 5-0.79 MG/ML-% IV SOLN
500.0000 mg | Freq: Three times a day (TID) | INTRAVENOUS | Status: DC
  Administered 2018-02-20 – 2018-02-21 (×3): 500 mg via INTRAVENOUS
  Filled 2018-02-20 (×3): qty 100

## 2018-02-20 MED ORDER — SODIUM CHLORIDE 0.9 % IV SOLN
INTRAVENOUS | Status: DC
  Administered 2018-02-20 – 2018-02-21 (×2): via INTRAVENOUS

## 2018-02-20 MED ORDER — IOPAMIDOL (ISOVUE-300) INJECTION 61%
INTRAVENOUS | Status: AC
  Filled 2018-02-20: qty 100

## 2018-02-20 MED ORDER — VITAMIN B-1 100 MG PO TABS
100.0000 mg | ORAL_TABLET | Freq: Every day | ORAL | Status: DC
  Administered 2018-02-26 – 2018-03-03 (×4): 100 mg via ORAL
  Filled 2018-02-20 (×4): qty 1

## 2018-02-20 MED ORDER — IOPAMIDOL (ISOVUE-300) INJECTION 61%
100.0000 mL | Freq: Once | INTRAVENOUS | Status: AC | PRN
  Administered 2018-02-20: 100 mL via INTRAVENOUS

## 2018-02-20 MED ORDER — SODIUM CHLORIDE 0.9 % IV BOLUS (SEPSIS)
1000.0000 mL | Freq: Once | INTRAVENOUS | Status: AC
  Administered 2018-02-20: 1000 mL via INTRAVENOUS

## 2018-02-20 MED ORDER — LORAZEPAM 2 MG/ML IJ SOLN
2.0000 mg | Freq: Once | INTRAMUSCULAR | Status: DC
  Filled 2018-02-20: qty 1

## 2018-02-20 MED ORDER — SODIUM CHLORIDE 0.9 % IV SOLN
2.0000 g | Freq: Three times a day (TID) | INTRAVENOUS | Status: DC
  Administered 2018-02-20 – 2018-02-21 (×2): 2 g via INTRAVENOUS
  Filled 2018-02-20 (×3): qty 2

## 2018-02-20 MED ORDER — DEXMEDETOMIDINE HCL IN NACL 200 MCG/50ML IV SOLN
0.2000 ug/kg/h | INTRAVENOUS | Status: DC
  Administered 2018-02-21 (×2): 0.2 ug/kg/h via INTRAVENOUS
  Administered 2018-02-21: 0.7 ug/kg/h via INTRAVENOUS
  Administered 2018-02-22 (×2): 0.4 ug/kg/h via INTRAVENOUS
  Administered 2018-02-22 (×2): 0.6 ug/kg/h via INTRAVENOUS
  Administered 2018-02-23: 0.5 ug/kg/h via INTRAVENOUS
  Administered 2018-02-23: 0.6 ug/kg/h via INTRAVENOUS
  Administered 2018-02-24 (×2): 0.5 ug/kg/h via INTRAVENOUS
  Administered 2018-02-24: 0.2 ug/kg/h via INTRAVENOUS
  Administered 2018-02-25: 0.5 ug/kg/h via INTRAVENOUS
  Administered 2018-02-25: 0.4 ug/kg/h via INTRAVENOUS
  Administered 2018-02-25: 0.6 ug/kg/h via INTRAVENOUS
  Administered 2018-02-26: 0.2 ug/kg/h via INTRAVENOUS
  Filled 2018-02-20 (×19): qty 50

## 2018-02-20 MED ORDER — LORAZEPAM 2 MG/ML IJ SOLN
0.0000 mg | Freq: Two times a day (BID) | INTRAMUSCULAR | Status: DC
  Administered 2018-02-23: 2 mg via INTRAVENOUS
  Filled 2018-02-20: qty 1

## 2018-02-20 MED ORDER — ACETAMINOPHEN 650 MG RE SUPP
650.0000 mg | Freq: Four times a day (QID) | RECTAL | Status: DC | PRN
  Administered 2018-02-21: 650 mg via RECTAL
  Filled 2018-02-20: qty 1

## 2018-02-20 MED ORDER — LORAZEPAM 2 MG/ML IJ SOLN
0.0000 mg | Freq: Four times a day (QID) | INTRAMUSCULAR | Status: AC
  Administered 2018-02-20 – 2018-02-22 (×4): 2 mg via INTRAVENOUS
  Filled 2018-02-20 (×3): qty 1
  Filled 2018-02-20: qty 2
  Filled 2018-02-20: qty 1

## 2018-02-20 MED ORDER — MORPHINE SULFATE (PF) 4 MG/ML IV SOLN
4.0000 mg | Freq: Once | INTRAVENOUS | Status: AC
  Administered 2018-02-20: 4 mg via INTRAVENOUS
  Filled 2018-02-20: qty 1

## 2018-02-20 MED ORDER — LORAZEPAM 1 MG PO TABS: 0.0000 mg | ORAL_TABLET | Freq: Two times a day (BID) | ORAL | Status: DC

## 2018-02-20 MED ORDER — ORAL CARE MOUTH RINSE
15.0000 mL | Freq: Two times a day (BID) | OROMUCOSAL | Status: DC
  Administered 2018-02-20 – 2018-03-03 (×15): 15 mL via OROMUCOSAL

## 2018-02-20 MED ORDER — VANCOMYCIN HCL 10 G IV SOLR: 1500.0000 mg | INTRAVENOUS | Status: DC

## 2018-02-20 MED ORDER — MICONAZOLE NITRATE 2 % EX CREA
TOPICAL_CREAM | Freq: Two times a day (BID) | CUTANEOUS | Status: DC
  Administered 2018-02-20 – 2018-02-24 (×8): via TOPICAL
  Administered 2018-02-25 (×2): 1 via TOPICAL
  Administered 2018-02-26 – 2018-03-01 (×5): via TOPICAL
  Administered 2018-03-01: 1 via TOPICAL
  Administered 2018-03-02 – 2018-03-04 (×4): via TOPICAL
  Administered 2018-03-04: 1 via TOPICAL
  Administered 2018-03-05 (×2): via TOPICAL
  Administered 2018-03-06: 1 via TOPICAL
  Filled 2018-02-20 (×2): qty 28

## 2018-02-20 MED ORDER — LORAZEPAM 1 MG PO TABS: 0.0000 mg | ORAL_TABLET | Freq: Four times a day (QID) | ORAL | Status: AC

## 2018-02-20 MED ORDER — THIAMINE HCL 100 MG/ML IJ SOLN
100.0000 mg | Freq: Every day | INTRAMUSCULAR | Status: DC
  Administered 2018-02-21 – 2018-03-01 (×7): 100 mg via INTRAVENOUS
  Filled 2018-02-20 (×7): qty 2

## 2018-02-20 MED ORDER — VANCOMYCIN HCL 10 G IV SOLR
1500.0000 mg | Freq: Once | INTRAVENOUS | Status: AC
  Administered 2018-02-20: 1500 mg via INTRAVENOUS
  Filled 2018-02-20: qty 1500

## 2018-02-20 MED ORDER — POTASSIUM CHLORIDE 10 MEQ/100ML IV SOLN
10.0000 meq | INTRAVENOUS | Status: AC
  Administered 2018-02-20 (×2): 10 meq via INTRAVENOUS
  Filled 2018-02-20 (×3): qty 100

## 2018-02-20 MED ORDER — MAGNESIUM SULFATE 2 GM/50ML IV SOLN
2.0000 g | Freq: Once | INTRAVENOUS | Status: AC
  Administered 2018-02-20: 2 g via INTRAVENOUS
  Filled 2018-02-20 (×2): qty 50

## 2018-02-20 MED ORDER — SODIUM CHLORIDE (PF) 0.9 % IJ SOLN
INTRAMUSCULAR | Status: AC
  Filled 2018-02-20: qty 50

## 2018-02-20 MED ORDER — MAGNESIUM SULFATE 50 % IJ SOLN: 2.0000 g | Freq: Once | INTRAMUSCULAR | Status: DC

## 2018-02-20 MED ORDER — ACETAMINOPHEN 650 MG RE SUPP
650.0000 mg | Freq: Once | RECTAL | Status: AC
  Administered 2018-02-20: 650 mg via RECTAL
  Filled 2018-02-20: qty 1

## 2018-02-20 MED ORDER — LORAZEPAM 2 MG/ML IJ SOLN: 2.0000 mg | Freq: Once | INTRAMUSCULAR | Status: DC

## 2018-02-20 NOTE — ED Notes (Signed)
Patient transported to CT 

## 2018-02-20 NOTE — ED Notes (Signed)
Pt's lactic acid 9.6 relayed to N. Graylon Gunning, RN.

## 2018-02-20 NOTE — ED Notes (Signed)
Patient began to have full body seizure and became non-verbal. Patient BP and HR became elevated. PA and MD was made aware by other RN.

## 2018-02-20 NOTE — Progress Notes (Signed)
A consult was received from an ED physician for cefepime per pharmacy dosing.  The patient's profile has been reviewed for ht/wt/allergies/indication/available labs.   A one time order has been placed for cefepime 2 gm IV x 1 dose. Also flagyl 500 mg IV q8 ordered by MD.   Further antibiotics/pharmacy consults should be ordered by admitting physician if indicated.                       Thank you, Herby AbrahamMichelle T. Promise Bushong, Pharm.D 02/20/2018 2:09 PM

## 2018-02-20 NOTE — H&P (Signed)
NAME:  Albert Salazar, MRN:  [id number], DOB:  [birth year], LOS: 0 ADMISSION DATE:  02/20/2018, CONSULTATION DATE:  02/20/18 REFERRING MD: Dr Juleen China.  CHIEF COMPLAINT: Presented with groin pain  Brief History   Presented with groin pain for about a week duration  History of present illness   Patient is unable to provide any history of present Presented with a week history of groin pain Temperature was borderline when he came in Received fluids Received benzodiazepines for shaking chills and possible withdrawal symptoms  Past Medical History   Past Medical History:  Diagnosis Date  . Bipolar disorder (HCC)   . Blindness of left eye   . Cirrhosis of liver (HCC)   . Depression   . Hepatitis C   . Polysubstance abuse (HCC)    Significant Hospital Events   T-max of 107  Consults:  Urology  Procedures:    Significant Diagnostic Tests:  CT scan of the abdomen significant for hydrocele  Micro Data:  Blood cultures drawn today 02/20/2018  Antimicrobials:  Cefepime 1/23>> Vancomycin 1/23>> Flagyl 1/23>>  Interim history/subjective:  Patient is sedated at present Cooling Precautions  Objective   Blood pressure (!) 181/97, pulse (!) 164, temperature (!) 107.7 F (42.1 C), temperature source Rectal, resp. rate 20, height 5\' 9"  (1.753 m), weight 74.8 kg, SpO2 100 %.       No intake or output data in the 24 hours ending 02/20/18 1622 Filed Weights   02/20/18 1101  Weight: 74.8 kg    Examination: General: Elderly gentleman, sedated HENT: Dry oral mucosa Lungs: Poor air movement, not of adventitious sounds Cardiovascular: S1-S2 Abdomen: Bowel sounds appreciated, groin swelling Extremities: No edema, no clubbing Neuro: Sedated   Resolved Hospital Problem list     Assessment & Plan:   Alcohol withdrawal/DTs -We will continue with withdrawal protocol -May require Precedex if he gets very agitated  Sepsis -Follow-up on cultures -We will continue antibiotics at  present including vancomycin, cefepime and Flagyl -Fluid resuscitation -Monitor lactate  Inguinal hydrocele -We will follow with urology -Appears benign on CT  History of bipolar disorder  -We will maintain him on his chronic medication  history of cirrhosis -Follow LFTs  Thrombocytopenia -Likely related to his alcoholism -Has no evidence of acute bleeding -Will continue to follow  History of cocaine use disorder History of chronic hepatitis C -Continue to monitor  Macerated skin around groin -May be fungal, start on miconazole cream  Best practice:  Diet: N.p.o. Pain/Anxiety/Delirium protocol (if indicated): Benzodiazepines VAP protocol (if indicated): n/a DVT prophylaxis: scd GI prophylaxis: pepcid Glucose control:  Mobility: Bedrest Code Status: Full code Family Communication: No family at bedside Disposition:   Labs   CBC: Recent Labs  Lab 02/20/18 1157  WBC 6.1  NEUTROABS 4.4  HGB 12.7*  HCT 39.2  MCV 104.0*  PLT 19*    Basic Metabolic Panel: Recent Labs  Lab 02/20/18 1157  NA 131*  K 3.1*  CL 95*  CO2 23  GLUCOSE 116*  BUN 16  CREATININE 1.30*  CALCIUM 8.3*   GFR: Estimated Creatinine Clearance: 60.4 mL/min (A) (by C-G formula based on SCr of 1.3 mg/dL (H)). Recent Labs  Lab 02/20/18 1157 02/20/18 1300  WBC 6.1  --   LATICACIDVEN  --  4.1*    Liver Function Tests: Recent Labs  Lab 02/20/18 1157  AST 163*  ALT 61*  ALKPHOS 104  BILITOT 3.3*  PROT 9.1*  ALBUMIN 2.8*   No results for input(s): LIPASE, AMYLASE  in the last 168 hours. No results for input(s): AMMONIA in the last 168 hours.  ABG    Component Value Date/Time   TCO2 26 12/08/2012 1143     Coagulation Profile: Recent Labs  Lab 02/20/18 1526  INR 1.44    Cardiac Enzymes: No results for input(s): CKTOTAL, CKMB, CKMBINDEX, TROPONINI in the last 168 hours.  HbA1C: Hgb A1c MFr Bld  Date/Time Value Ref Range Status  04/21/2014 10:37 AM 5.7 (H) <5.7 %  Final    Comment:                                                                           According to the ADA Clinical Practice Recommendations for 2011, when HbA1c is used as a screening test:     >=6.5%   Diagnostic of Diabetes Mellitus            (if abnormal result is confirmed)   5.7-6.4%   Increased risk of developing Diabetes Mellitus   References:Diagnosis and Classification of Diabetes Mellitus,Diabetes Care,2011,34(Suppl 1):S62-S69 and Standards of Medical Care in         Diabetes - 2011,Diabetes Care,2011,34 (Suppl 1):S11-S61.       CBG: No results for input(s): GLUCAP in the last 168 hours.  Review of Systems:   Review of Systems  Unable to perform ROS: Acuity of condition     Past Medical History  He,  has a past medical history of Bipolar disorder (HCC), Blindness of left eye, Cirrhosis of liver (HCC), Depression, Hepatitis C, and Polysubstance abuse (HCC).   Surgical History    Past Surgical History:  Procedure Laterality Date  . EYE SURGERY       Social History   reports that he has been smoking cigarettes. He has a 40.00 pack-year smoking history. He has never used smokeless tobacco. He reports current alcohol use of about 8.0 standard drinks of alcohol per week. He reports current drug use. Drugs: IV and Cocaine.   Family History   His family history includes Diabetes in his brother.   Allergies No Known Allergies   Home Medications  Prior to Admission medications   Not on File     Critical care time: 35 minutes spent reviewing records, formulating plan of care

## 2018-02-20 NOTE — Progress Notes (Signed)
Pharmacy Antibiotic Note  Albert Salazar is a 63 y.o. male admitted on 02/20/2018 with groin pain and sepsis.  Pharmacy has been consulted for vancomycin and cefepime dosing. Temp 107.8 in ED, CT: BPH, R inguinal fluid collection -concern for hydrocele or scrotal cellulitis - per urology "No worrisome features for infection on CT or exam"- inguinal fluid collection is incidental. Lactate 9.6  Plan: Flagyl 500 mg IV q8h per MD Cefepime 2 gm x 1 then cefepime 2 gm IV q8 Vancomycin 1500 mg IV loading dose Vancomycin 1500 mg IV Q 24 hrs. Goal AUC 400-550. Expected AUC: 529.7 SCr used: 1.3 F/u renal fxn, WBC, temp, culture Vancomycin levels as needed  Height: 5\' 9"  (175.3 cm) Weight: 165 lb (74.8 kg) IBW/kg (Calculated) : 70.7  Temp (24hrs), Avg:103.4 F (39.7 C), Min:98.2 F (36.8 C), Max:107.8 F (42.1 C)  Recent Labs  Lab 02/20/18 1157 02/20/18 1300  WBC 6.1  --   CREATININE 1.30*  --   LATICACIDVEN  --  4.1*    Estimated Creatinine Clearance: 60.4 mL/min (A) (by C-G formula based on SCr of 1.3 mg/dL (H)).    No Known Allergies  Antimicrobials this admission: 1/23 vanc>> 1/23 cefepime>> 1/23 flagyl>> Dose adjustments this admission:  Microbiology results: 1/23 HIV>> 1/23 hepatitis> 1/23 BCx2>> Thank you for allowing pharmacy to be a part of this patient's care.  Herby Abraham, Pharm.D [phone] 02/20/2018 4:34 PM

## 2018-02-20 NOTE — ED Notes (Signed)
CRITICAL VALUE STICKER  CRITICAL VALUE: Lactic Acid 4.1  RECEIVER (on-site recipient of call): Leilani AbleNgozi Attallah Ontko, RN  DATE & TIME NOTIFIED: 02/20/2018 1340  MESSENGER (representative from lab): Tammy

## 2018-02-20 NOTE — Progress Notes (Signed)
eLink Physician-Brief Progress Note Patient Name: Albert Salazar DOB: 1955-05-08 MRN: [id number]   Date of Service  02/20/2018  HPI/Events of Note  Transient somnolence but aroused with gentle shaking. Patient is breathing spontaneously and protecting his airway. He received 8 mg of Ativan in the ED for substance withdrawal.  eICU Interventions  Hold Precedex for now. Monitor patient closely for any indication of deepening alteration of mental status or respiratory compromise.        [name] Albert Salazar 02/20/2018, 9:35 PM

## 2018-02-20 NOTE — ED Triage Notes (Signed)
Patient arrived by EMS from home. Pt noticed a knot on the RT groin about a week ago. Pt c/o of testicular pain. Pt denies any pain per EMS. Pt denies N/V/D.   Pt denies having any prior health problems.

## 2018-02-20 NOTE — ED Notes (Signed)
Date and time results received: 02/20/18 1:30 PM  Test: Platelet Critical Value: 19  Name of Provider Notified: Glenford Bayley PA

## 2018-02-20 NOTE — ED Notes (Signed)
Bed: WA17 Expected date:  Expected time:  Means of arrival:  Comments: EMS 62yo groin "knot", testicular pain x 1 week

## 2018-02-20 NOTE — ED Notes (Signed)
Patient has been taken to the shower to have a bathe due to hygiene concerns. This RN will proceed with orders once patient returns.

## 2018-02-20 NOTE — ED Provider Notes (Signed)
[facility]   CSN: [id number] Arrival date & time: 02/20/18  1045     History   Chief Complaint Chief Complaint  Patient presents with  . Groin Pain    HPI Albert Salazar is a 63 y.o. male with history of bipolar disorder, blindness of the left eye, cirrhosis of the liver, hepatitis C, alcohol abuse who presents with scrotal pain and swelling for the past few weeks.  He also reports some lower abdominal pain.  Patient denies any difficulty urinating or passing bowel movements.  He denies any chest pain, shortness of breath, cough, fevers at home.  He reports drinking 2, 40 ounce beers daily, although he has not drank in the past couple days.  He has not tried any interventions at home for symptoms.  He has no known history of hernia.  HPI  Past Medical History:  Diagnosis Date  . Bipolar disorder (HCC)   . Blindness of left eye   . Cirrhosis of liver (HCC)   . Depression   . Hepatitis C   . Polysubstance abuse [facility])     Patient Active Problem List   Diagnosis Date Noted  . Alcohol withdrawal (HCC) 02/20/2018  . Cirrhosis of liver with ascites (HCC) 06/13/2014  . Thrombocytopenia (HCC) 06/13/2014  . Alcohol intoxication (HCC) 06/13/2014  . Normocytic anemia 06/13/2014  . Peripheral edema 06/13/2014  . Left epididymitis 06/13/2014  . Dysuria 06/13/2014  . Alcohol abuse   . Alcoholic cirrhosis of liver with ascites (HCC)   . Chronic hepatitis C without hepatic coma (HCC) 06/07/2014  . Hepatic cirrhosis (HCC) 06/07/2014  . Elevated BP 04/21/2014  . Alcohol use disorder, severe, dependence (HCC) 12/30/2013  . Cocaine use disorder, mild, abuse (HCC) 12/30/2013  . Substance induced mood disorder (HCC) 12/30/2013    Past Surgical History:  Procedure Laterality Date  . EYE SURGERY          Home Medications    Prior to Admission medications   Not on File    Family History Family History  Problem Relation Age of  Onset  . Diabetes Brother     Social History Social History   Tobacco Use  . Smoking status: Current Every Day Smoker    Packs/day: 1.00    Years: 40.00    Pack years: 40.00    Types: Cigarettes  . Smokeless tobacco: Never Used  Substance Use Topics  . Alcohol use: Yes    Alcohol/week: 8.0 standard drinks    Types: 8 Cans of beer per week  . Drug use: Yes    Types: IV, Cocaine     Allergies   Patient has no known allergies.   Review of Systems Review of Systems  Constitutional: Negative for chills and fever.  HENT: Negative for facial swelling and sore throat.   Respiratory: Negative for shortness of breath.   Cardiovascular: Negative for chest pain.  Gastrointestinal: Positive for abdominal pain. Negative for nausea and vomiting.  Genitourinary: Positive for scrotal swelling and testicular pain. Negative for discharge, dysuria, penile pain and penile swelling.  Musculoskeletal: Negative for back pain.  Skin: Negative for rash and wound.  Neurological: Negative for headaches.  Psychiatric/Behavioral: The patient is not nervous/anxious.      Physical Exam Updated Vital Signs BP (!) 176/81   Pulse (!) 147   Temp (!) 105.4 F (40.8 C) (Rectal)   Resp 19   Ht 5\' 9"  (1.753 m)   Wt 72.1 kg   SpO2  100%   BMI 23.47 kg/m   Physical Exam Vitals signs and nursing note reviewed.  Constitutional:      General: He is not in acute distress.    Appearance: He is well-developed. He is not diaphoretic.  HENT:     Head: Normocephalic and atraumatic.     Mouth/Throat:     Pharynx: No oropharyngeal exudate.  Eyes:     General: No scleral icterus.       Right eye: No discharge.        Left eye: No discharge.     Conjunctiva/sclera: Conjunctivae normal.     Pupils: Pupils are equal, round, and reactive to light.  Neck:     Musculoskeletal: Normal range of motion and neck supple.     Thyroid: No thyromegaly.  Cardiovascular:     Rate and Rhythm: Normal rate and  regular rhythm.     Heart sounds: Normal heart sounds. No murmur. No friction rub. No gallop.   Pulmonary:     Effort: Pulmonary effort is normal. No respiratory distress.     Breath sounds: Normal breath sounds. No stridor. No wheezing or rales.  Abdominal:     General: Bowel sounds are normal. There is no distension.     Palpations: Abdomen is soft.     Tenderness: There is no abdominal tenderness. There is no guarding or rebound.     Hernia: A hernia is present. Hernia is present in the right inguinal area.  Genitourinary:    Penis: Normal and uncircumcised. No phimosis, paraphimosis, erythema, tenderness, discharge or swelling.      Scrotum/Testes:        Right: Mass and tenderness present.        Left: Tenderness present.     Comments: Macerated tissue on bilateral aspects of the scrotum as well as inner thighs; significant tenderness to the scrotum with mass in the right inguinal canal Perineal tenderness, no skin breakdown of the perineum or rectum Lymphadenopathy:     Cervical: No cervical adenopathy.  Skin:    General: Skin is warm and dry.     Coloration: Skin is not pale.     Findings: No rash.  Neurological:     Mental Status: He is alert.     Coordination: Coordination normal.      ED Treatments / Results  Labs (all labs ordered are listed, but only abnormal results are displayed) Labs Reviewed  COMPREHENSIVE METABOLIC PANEL - Abnormal; Notable for the following components:      Result Value   Sodium 131 (*)    Potassium 3.1 (*)    Chloride 95 (*)    Glucose, Bld 116 (*)    Creatinine, Ser 1.30 (*)    Calcium 8.3 (*)    Total Protein 9.1 (*)    Albumin 2.8 (*)    AST 163 (*)    ALT 61 (*)    Total Bilirubin 3.3 (*)    GFR calc non Af Amer 59 (*)    All other components within normal limits  CBC WITH DIFFERENTIAL/PLATELET - Abnormal; Notable for the following components:   RBC 3.77 (*)    Hemoglobin 12.7 (*)    MCV 104.0 (*)    RDW 15.6 (*)     Platelets 19 (*)    All other components within normal limits  LACTIC ACID, PLASMA - Abnormal; Notable for the following components:   Lactic Acid, Venous 4.1 (*)    All other components within normal limits  PROTIME-INR - Abnormal; Notable for the following components:   Prothrombin Time 17.3 (*)    All other components within normal limits  LACTIC ACID, PLASMA - Abnormal; Notable for the following components:   Lactic Acid, Venous 9.6 (*)    All other components within normal limits  CULTURE, BLOOD (ROUTINE X 2)  CULTURE, BLOOD (ROUTINE X 2)  CULTURE, BLOOD (ROUTINE X 2)  CULTURE, BLOOD (ROUTINE X 2)  MRSA PCR SCREENING  URINALYSIS, ROUTINE W REFLEX MICROSCOPIC  HEPATITIS PANEL, ACUTE  HIV ANTIBODY (ROUTINE TESTING W REFLEX)  HEPATIC FUNCTION PANEL  CBC  BASIC METABOLIC PANEL  BLOOD GAS, ARTERIAL  MAGNESIUM  PHOSPHORUS  TYPE AND SCREEN    EKG None  Radiology Ct Abdomen Pelvis W Contrast  Result Date: 02/20/2018 CLINICAL DATA:  Right groin palpable abnormality for 1 week, initial encounter EXAM: CT ABDOMEN AND PELVIS WITH CONTRAST TECHNIQUE: Multidetector CT imaging of the abdomen and pelvis was performed using the standard protocol following bolus administration of intravenous contrast. CONTRAST:  ISOVUE-300 IOPAMIDOL (ISOVUE-300) INJECTION 61% COMPARISON:  07/22/2014 FINDINGS: Lower chest: No acute abnormality. Hepatobiliary: No focal liver abnormality is seen. No gallstones, gallbladder wall thickening, or biliary dilatation. Pancreas: Unremarkable. No pancreatic ductal dilatation or surrounding inflammatory changes. Spleen: Normal in size without focal abnormality. Adrenals/Urinary Tract: Adrenal glands are within normal limits. Kidneys are well visualized bilaterally. Normal enhancement and delayed excretion is seen. No obstructive changes are noted. The bladder is well distended. Stomach/Bowel: Colon is decompressed. No obstructive changes are seen. The appendix is well  visualized and within normal limits. No inflammatory changes are seen. Few mildly prominent loops of small bowel are noted although no definitive obstruction is seen. The stomach is within normal limits. Vascular/Lymphatic: Aortic atherosclerosis. No enlarged abdominal or pelvic lymph nodes. Reproductive: The prostate is enlarged with a significant amount of extension into the bladder lumen. This may simply represent benign hypertrophy although further nonemergent workup is recommended. Other: No free fluid is noted within the abdomen or pelvis. No umbilical hernia is seen. An ovoid fluid collection is noted within the right inguinal canal extending into the scrotum. This measures 3.5 x 3.6 cm in greatest transverse and AP dimensions and extends for approximately 9.4 cm in craniocaudad projection. This may represent superior extension of a large hydrocele. This is not related to bowel herniation. Musculoskeletal: Mild degenerative changes of the lumbar spine are seen. IMPRESSION: Ovoid fluid collection within the right inguinal canal extending into the scrotum consistent with the given clinical history. This likely represents a large hydrocele extending superiorly in the inguinal canal. No hernia is noted in this region. Normal-appearing appendix. Prominent prostate extending into the bladder lumen. This may simply be related to hypertrophy although further nonemergent workup is recommended. Electronically Signed   By: Alcide Clever M.D.   On: 02/20/2018 14:45   Dg Chest Portable 1 View  Result Date: 02/20/2018 CLINICAL DATA:  Testicular pain for a week.  Polysubstance abuse. EXAM: PORTABLE CHEST 1 VIEW COMPARISON:  December 23, 2015 FINDINGS: The heart, hila, mediastinum, and pleura are normal. No convincing infiltrate. IMPRESSION: No active disease. Electronically Signed   By: Gerome Sam III M.D   On: 02/20/2018 16:42    Procedures .Critical Care Performed by: Emi Holes, PA-C Authorized by:  Emi Holes, PA-C   Critical care provider statement:    Critical care time (minutes):  45   Critical care was necessary to treat or prevent imminent or life-threatening deterioration of the following  conditions:  Metabolic crisis, sepsis, shock and endocrine crisis   Critical care was time spent personally by me on the following activities:  Discussions with consultants, evaluation of patient's response to treatment, examination of patient, ordering and performing treatments and interventions, ordering and review of laboratory studies, ordering and review of radiographic studies, pulse oximetry, re-evaluation of patient's condition, obtaining history from patient or surrogate and review of old charts   I assumed direction of critical care for this patient from another provider in my specialty: yes     (including critical care time)  Medications Ordered in ED Medications  metroNIDAZOLE (FLAGYL) IVPB 500 mg (500 mg Intravenous New Bag/Given 02/20/18 1520)  potassium chloride 10 mEq in 100 mL IVPB (10 mEq Intravenous New Bag/Given 02/20/18 1750)  magnesium sulfate IVPB 2 g 50 mL (has no administration in time range)  iopamidol (ISOVUE-300) 61 % injection (has no administration in time range)  sodium chloride (PF) 0.9 % injection (has no administration in time range)  LORazepam (ATIVAN) injection 0-4 mg (2 mg Intravenous Given 02/20/18 1540)    Or  LORazepam (ATIVAN) tablet 0-4 mg ( Oral See Alternative 02/20/18 1540)  LORazepam (ATIVAN) injection 0-4 mg (has no administration in time range)    Or  LORazepam (ATIVAN) tablet 0-4 mg (has no administration in time range)  thiamine (VITAMIN B-1) tablet 100 mg (has no administration in time range)    Or  thiamine (B-1) injection 100 mg (has no administration in time range)  LORazepam (ATIVAN) injection 2 mg (has no administration in time range)  LORazepam (ATIVAN) injection 2 mg (has no administration in time range)  0.9 %  sodium chloride  infusion (has no administration in time range)  famotidine (PEPCID) IVPB 20 mg premix (has no administration in time range)  acetaminophen (TYLENOL) suppository 650 mg (has no administration in time range)  vancomycin (VANCOCIN) 1,500 mg in sodium chloride 0.9 % 500 mL IVPB (has no administration in time range)  miconazole (MICOTIN) 2 % cream (has no administration in time range)  ceFEPIme (MAXIPIME) 2 g in sodium chloride 0.9 % 100 mL IVPB (has no administration in time range)  vancomycin (VANCOCIN) 1,500 mg in sodium chloride 0.9 % 500 mL IVPB (has no administration in time range)  morphine 4 MG/ML injection 4 mg (4 mg Intravenous Given 02/20/18 1252)  ceFEPIme (MAXIPIME) 2 g in sodium chloride 0.9 % 100 mL IVPB (2 g Intravenous New Bag/Given 02/20/18 1509)  sodium chloride 0.9 % bolus 1,000 mL (1,000 mLs Intravenous New Bag/Given 02/20/18 1511)    And  sodium chloride 0.9 % bolus 1,000 mL (1,000 mLs Intravenous New Bag/Given 02/20/18 1504)    And  sodium chloride 0.9 % bolus 250 mL (250 mLs Intravenous New Bag/Given 02/20/18 1633)  iopamidol (ISOVUE-300) 61 % injection 100 mL (100 mLs Intravenous Contrast Given 02/20/18 1424)  acetaminophen (TYLENOL) suppository 650 mg (650 mg Rectal Given 02/20/18 1618)     Initial Impression / Assessment and Plan / ED Course  I have reviewed the triage vital signs and the nursing notes.  Pertinent labs & imaging results that were available during my care of the patient were reviewed by me and considered in my medical decision making (see chart for details).     On my initial exam, patient is comfortable without tremulousness answering questions and following commands.  He was afebrile.  Labs ordered and CT abdomen pelvis ordered. Patient's lactic returned elevated and sepsis protocol initiated.  Intra-abdominal antibiotics, cefepime and Flagyl,  and weight-based fluids were initiated at that time.  Blood cultures pending.  After patient returned from CT, I was  told patient developed a mild tremor and CIWA protocol was ordered and patient was given 2 mg of Ativan.  Patient continued to decompensate and began full body shaking and incomprehensible speech.  2 more of Ativan was given and symptoms continued.  Meanwhile, patient very tachycardic at 150-170.  Patient given 4 mg Ativan which finally resolved patient's symptoms.  Immediately following, rectal temp was conducted as patient felt very warm and sweaty and rectal temp was 107.7.  Patient immediately given rectal Tylenol as well as axillary ice and cool rags throughout entire body.  During the above, patient was evaluated by critical care intensivist, Dr. Wynona Neatlalere, who will admit.  Patient's CT abdomen pelvis shows probable hydrocele and not hernia as initially thought.  Dr. Mena GoesEskridge with urology evaluated the patient and found no signs of infection or need for emergent intervention at this time.  Unsure if patient actually septic or more suffering from significant alcohol withdrawal symptoms, or both.  Dr. Wynona Neatlalere added vancomycin to cover. patient also evaluated by my attending, Dr. Juleen ChinaKohut, who guided the patient's management and agrees with plan.  Final Clinical Impressions(s) / ED Diagnoses   Final diagnoses:  Alcohol withdrawal syndrome, with delirium (HCC)  Severe sepsis [facility](HCC)    ED Discharge Orders    None       Emi HolesLaw, Jamall Strohmeier M, PA-C 02/20/18 1757    Raeford RazorKohut, Stephen, MD 02/21/18 0830

## 2018-02-20 NOTE — ED Provider Notes (Signed)
Medical screening examination/treatment/procedure(s) were conducted as a shared visit with non-physician practitioner(s) and myself.  I personally evaluated the patient during the encounter.  None  62yM with groin/R testicular pain. Hx of alcohol abuse. Arrived very unkept. Dried stool caked to his perineum and thighs. Taken to the shower. Exam with macerated skin to b/l proximal/medial thighs and scrotum consistent with chronic wet environment and poor hygiene. R scrotal mass. Very tender. I initially thought it was an incarcerated inguinal hernia and unsuccessfully attempted to reduce but subsequent CT more consistent with hydrocele. Urology curbsided. They feel that this is likely non-contributory to   Pt arrived with some clinical evidence of alcohol withdrawal but was ambulating, answering questions and following commands. Mild tremor. Mild tachycardia. He subsequently rapidly decompensated during his ED stay. Increasingly tachycardic, hypertensive and hyperthermic. Benzos. Critical Care actually already in the ED and was consulted. Clinical picture most consistent with severe etoh withdrawal and less likely sepsis although could be concurrent process.    CRITICAL CARE Performed by: Raeford Razor Total critical care time: 35 minutes Critical care time was exclusive of separately billable procedures and treating other patients. Critical care was necessary to treat or prevent imminent or life-threatening deterioration. Critical care was time spent personally by me on the following activities: development of treatment plan with patient and/or surrogate as well as nursing, discussions with consultants, evaluation of patient's response to treatment, examination of patient, obtaining history from patient or surrogate, ordering and performing treatments and interventions, ordering and review of laboratory studies, ordering and review of radiographic studies, pulse oximetry and re-evaluation of patient's  condition.    Raeford Razor, MD 02/21/18 0830

## 2018-02-20 NOTE — Consult Note (Addendum)
Consultation: Right hydrocele, BPH Requested by: Dr. Raeford RazorStephen Kohut  History of Present Illness: Albert Salazar is a 63 year old male who presented and appears to be in withdrawal from alcohol.  He had an elevated lactic acid and elevated temperature and there was concern that right hydrocele or scrotal cellulitis was contributing to his symptoms.  Per Dr. Juleen ChinaKohut, "Exam with macerated skin to b/l proximal/medial thighs and scrotum consistent with chronic wet environment and poor hygiene. R scrotal mass. Very tender. I initially thought it was an incarcerated inguinal hernia".   CT scan was obtained which revealed BPH without a median lobe protrusion into the bladder, and right inguinal fluid collection.  There was no stranding or edema around the fluid collection.  Appeared simple.  It did not appear to involve the bowel.  Patient now has had an elevated temperature up to 107.   He has seen Dr. Retta Dionesahlstedt in the past for scrotal edema.   Past Medical History:  Diagnosis Date  . Bipolar disorder (HCC)   . Blindness of left eye   . Cirrhosis of liver (HCC)   . Depression   . Hepatitis C   . Polysubstance abuse [facility](HCC)    Past Surgical History:  Procedure Laterality Date  . EYE SURGERY      Home Medications:  (Not in a hospital admission)  Allergies: No Known Allergies  Family History  Problem Relation Age of Onset  . Diabetes Brother    Social History:  reports that he has been smoking cigarettes. He has a 40.00 pack-year smoking history. He has never used smokeless tobacco. He reports current alcohol use of about 8.0 standard drinks of alcohol per week. He reports current drug use. Drugs: IV and Cocaine.  ROS: A complete review of systems was performed.  All systems are negative except for pertinent findings as noted. Review of Systems  Unable to perform ROS: Mental acuity     Physical Exam:  Vital signs in last 24 hours: Temp:  [98.2 F (36.8 C)-107.8 F (42.1 C)] 106.1 F (41.2  C) (01/23 1642) Pulse Rate:  [108-164] 151 (01/23 1642) Resp:  [14-28] 28 (01/23 1642) BP: (127-205)/(66-97) 127/66 (01/23 1642) SpO2:  [24 %-100 %] 24 % (01/23 1642) Weight:  [74.8 kg] 74.8 kg (01/23 1101) General:  Sedated, shaking  HEENT: Normocephalic, atraumatic Cardiovascular: Regular rate and rhythm Lungs: Regular rate and effort Abdomen: Soft, nontender, nondistended, no abdominal masses Back: No CVA tenderness Extremities: No edema Neurologic: Grossly intact GU: some maceration of scrotal skin but no apparent cellulitis and no edema or necrosis. Minimal fluid palpable above the right testicle and no induration or fluctuance. No overlying redness. Testicles palpably normal.   Laboratory Data:  Results for orders placed or performed during the hospital encounter of 02/20/18 (from the past 24 hour(s))  Comprehensive metabolic panel     Status: Abnormal   Collection Time: 02/20/18 11:57 AM  Result Value Ref Range   Sodium 131 (L) 135 - 145 mmol/L   Potassium 3.1 (L) 3.5 - 5.1 mmol/L   Chloride 95 (L) 98 - 111 mmol/L   CO2 23 22 - 32 mmol/L   Glucose, Bld 116 (H) 70 - 99 mg/dL   BUN 16 6 - 20 mg/dL   Creatinine, Ser 9.601.30 (H) 0.61 - 1.24 mg/dL   Calcium 8.3 (L) 8.9 - 10.3 mg/dL   Total Protein 9.1 (H) 6.5 - 8.1 g/dL   Albumin 2.8 (L) 3.5 - 5.0 g/dL   AST 454163 (H) 15 -  41 U/L   ALT 61 (H) 0 - 44 U/L   Alkaline Phosphatase 104 38 - 126 U/L   Total Bilirubin 3.3 (H) 0.3 - 1.2 mg/dL   GFR calc non Af Amer 59 (L) >60 mL/min   GFR calc Af Amer >60 >60 mL/min   Anion gap 13 5 - 15  CBC with Differential     Status: Abnormal   Collection Time: 02/20/18 11:57 AM  Result Value Ref Range   WBC 6.1 4.0 - 10.5 K/uL   RBC 3.77 (L) 4.22 - 5.81 MIL/uL   Hemoglobin 12.7 (L) 13.0 - 17.0 g/dL   HCT 06.2 37.6 - 28.3 %   MCV 104.0 (H) 80.0 - 100.0 fL   MCH 33.7 26.0 - 34.0 pg   MCHC 32.4 30.0 - 36.0 g/dL   RDW 15.1 (H) 76.1 - 60.7 %   Platelets 19 (LL) 150 - 400 K/uL   nRBC 0.0 0.0 -  0.2 %   Neutrophils Relative % 72 %   Neutro Abs 4.4 1.7 - 7.7 K/uL   Lymphocytes Relative 21 %   Lymphs Abs 1.3 0.7 - 4.0 K/uL   Monocytes Relative 6 %   Monocytes Absolute 0.4 0.1 - 1.0 K/uL   Eosinophils Relative 0 %   Eosinophils Absolute 0.0 0.0 - 0.5 K/uL   Basophils Relative 0 %   Basophils Absolute 0.0 0.0 - 0.1 K/uL   Immature Granulocytes 1 %   Abs Immature Granulocytes 0.03 0.00 - 0.07 K/uL   Target Cells PRESENT   Lactic acid, plasma     Status: Abnormal   Collection Time: 02/20/18  1:00 PM  Result Value Ref Range   Lactic Acid, Venous 4.1 (HH) 0.5 - 1.9 mmol/L  Type and screen [facility]     Status: None (Preliminary result)   Collection Time: 02/20/18  3:05 PM  Result Value Ref Range   ABO/RH(D) PENDING    Antibody Screen PENDING    Sample Expiration      02/23/2018 Performed at [facility], [address] [place], [place] [address]   Protime-INR     Status: Abnormal   Collection Time: 02/20/18  3:26 PM  Result Value Ref Range   Prothrombin Time 17.3 (H) 11.4 - 15.2 seconds   INR 1.44    No results found for this or any previous visit (from the past 240 hour(s)). Creatinine: Recent Labs    02/20/18 1157  CREATININE 1.30*    Impression/Assessment/plan:  1) Right hydrocele/scrotal maceration -  No worrisome features for infection on CT or exam. I discussed with Dr. Wynona Neat and he feels pretty certain the patient's presentation is simply due to alcohol withdrawal. I'm not certain about that but agree the inguinal fluid collection is incidental.   2) BPH/median lobe - benign appearance on the CT. Follow UOP and consider bladder scan if pt having trouble emptying his bladder. Consider tamsulosin.   I will sign off but please page GU with any questions, concerns or changes in patient status or exam.    Jerilee Field 02/20/2018, 4:53 PM

## 2018-02-21 DIAGNOSIS — A4151 Sepsis due to Escherichia coli [E. coli]: Principal | ICD-10-CM

## 2018-02-21 LAB — BASIC METABOLIC PANEL
Anion gap: 11 (ref 5–15)
BUN: 21 mg/dL — ABNORMAL HIGH (ref 6–20)
CO2: 19 mmol/L — AB (ref 22–32)
Calcium: 7 mg/dL — ABNORMAL LOW (ref 8.9–10.3)
Chloride: 104 mmol/L (ref 98–111)
Creatinine, Ser: 1.6 mg/dL — ABNORMAL HIGH (ref 0.61–1.24)
GFR calc Af Amer: 53 mL/min — ABNORMAL LOW (ref >60)
GFR calc non Af Amer: 46 mL/min — ABNORMAL LOW (ref >60)
Glucose, Bld: 90 mg/dL (ref 70–99)
POTASSIUM: 3.6 mmol/L (ref 3.5–5.1)
Sodium: 134 mmol/L — ABNORMAL LOW (ref 135–145)

## 2018-02-21 LAB — BLOOD CULTURE ID PANEL (REFLEXED)

## 2018-02-21 LAB — PHOSPHORUS: Phosphorus: 4.4 mg/dL (ref 2.5–4.6)

## 2018-02-21 LAB — CBC
HCT: 29 % — ABNORMAL LOW (ref 39.0–52.0)
Hemoglobin: 9.3 g/dL — ABNORMAL LOW (ref 13.0–17.0)
MCH: 33.8 pg (ref 26.0–34.0)
MCHC: 32.1 g/dL (ref 30.0–36.0)
MCV: 105.5 fL — ABNORMAL HIGH (ref 80.0–100.0)
NRBC: 0 % (ref 0.0–0.2)
Platelets: 10 10*3/uL — CL (ref 150–400)
RBC: 2.75 MIL/uL — ABNORMAL LOW (ref 4.22–5.81)
RDW: 15.8 % — ABNORMAL HIGH (ref 11.5–15.5)
WBC: 9.3 10*3/uL (ref 4.0–10.5)

## 2018-02-21 LAB — HEPATITIS PANEL, ACUTE
HCV Ab: >11 s/co ratio — ABNORMAL HIGH (ref 0.0–0.9)
Hep A IgM: NEGATIVE
Hep B C IgM: NEGATIVE
Hepatitis B Surface Ag: NEGATIVE

## 2018-02-21 LAB — HIV ANTIBODY (ROUTINE TESTING W REFLEX): HIV Screen 4th Generation wRfx: NONREACTIVE

## 2018-02-21 LAB — LACTIC ACID, PLASMA: Lactic Acid, Venous: 2.3 mmol/L (ref 0.5–1.9)

## 2018-02-21 LAB — ABO/RH: ABO/RH(D): O POS

## 2018-02-21 LAB — MAGNESIUM: Magnesium: 1.1 mg/dL — ABNORMAL LOW (ref 1.7–2.4)

## 2018-02-21 MED ORDER — SODIUM CHLORIDE 0.9 % IV BOLUS
1000.0000 mL | Freq: Once | INTRAVENOUS | Status: AC
  Administered 2018-02-21: 1000 mL via INTRAVENOUS

## 2018-02-21 MED ORDER — ALBUMIN HUMAN 25 % IV SOLN
12.5000 g | Freq: Once | INTRAVENOUS | Status: AC
  Administered 2018-02-21: 12.5 g via INTRAVENOUS
  Filled 2018-02-21: qty 50

## 2018-02-21 MED ORDER — SODIUM CHLORIDE 0.9 % IV SOLN
100.0000 mg | Freq: Four times a day (QID) | INTRAVENOUS | Status: DC | PRN
  Filled 2018-02-21: qty 1

## 2018-02-21 MED ORDER — SODIUM CHLORIDE 0.9 % IV SOLN
6.0000 g | Freq: Once | INTRAVENOUS | Status: AC
  Administered 2018-02-21: 6 g via INTRAVENOUS
  Filled 2018-02-21: qty 2

## 2018-02-21 MED ORDER — SODIUM CHLORIDE 0.9 % IV BOLUS
500.0000 mL | Freq: Once | INTRAVENOUS | Status: AC
  Administered 2018-02-21: 500 mL via INTRAVENOUS

## 2018-02-21 MED ORDER — SODIUM CHLORIDE 0.9 % IV SOLN
2.0000 g | INTRAVENOUS | Status: DC
  Administered 2018-02-21 – 2018-02-27 (×7): 2 g via INTRAVENOUS
  Filled 2018-02-21 (×7): qty 2

## 2018-02-21 MED ORDER — POTASSIUM CHLORIDE 10 MEQ/100ML IV SOLN
10.0000 meq | INTRAVENOUS | Status: AC
  Administered 2018-02-21 (×2): 10 meq via INTRAVENOUS
  Filled 2018-02-21 (×2): qty 100

## 2018-02-21 NOTE — Progress Notes (Signed)
NAME:  Albert HedgeDavid Salazar, MRN:  [id number], DOB:  [birth year], LOS: 1 ADMISSION DATE:  02/20/2018, CONSULTATION DATE:  02/20/18 REFERRING MD: Dr Juleen ChinaKohut.  CHIEF COMPLAINT: Presented with groin pain  Brief History   Presented with groin pain for about a week duration Is an active drinker History of bipolar disorder, left eye blindness History of hepatitis C with polysubstance abuse  History of present illness   Patient is unable to provide any history of present Presented with a week history of groin pain Temperature was borderline when he came in, he did run a temperature 107 following having shaking chills Received fluids Received benzodiazepines for shaking chills and possible withdrawal symptoms  Past Medical History   Past Medical History:  Diagnosis Date  . Bipolar disorder (HCC)   . Blindness of left eye   . Cirrhosis of liver (HCC)   . Depression   . Hepatitis C   . Polysubstance abuse (HCC)    Significant Hospital Events   T-max of 107  Consults:  Urology-appears to be a simple hydrocele with no evidence of obstruction  Procedures:    Significant Diagnostic Tests:  CT scan of the abdomen significant for hydrocele  Micro Data:  Blood cultures drawn today 02/20/2018-E. coli in blood culture  Antimicrobials:  Cefepime 1/23>> Vancomycin 1/23>> will discontinue 1/24 Flagyl 1/23>> will discontinue 1/24  Interim history/subjective:  Patient is sedated at present  Rouses easily, gets agitated Hypotensive this morning-improving  Objective   Blood pressure (!) 102/46, pulse 78, temperature (!) 97.3 F (36.3 C), temperature source Axillary, resp. rate (!) 23, height 5\' 9"  (1.753 m), weight 74.1 kg, SpO2 100 %.        Intake/Output Summary (Last 24 hours) at 02/21/2018 1005 Last data filed at 02/21/2018 0800 Gross per 24 hour  Intake 2066.18 ml  Output -  Net 2066.18 ml   Filed Weights   02/20/18 1101 02/20/18 1700 02/21/18 0500  Weight: 74.8 kg 72.1 kg 74.1 kg     Examination: General: Elderly gentleman, sedated HENT: Moist oral mucosa Lungs: Poor air movement, rhonchi  cardiovascular: S1-S2 Abdomen: Bowel sounds appreciated, groin swelling Extremities: No edema, no clubbing Neuro: Sedated   Resolved Hospital Problem list     Assessment & Plan:   Alcohol withdrawal/DTs -We will continue with withdrawal protocol -Currently on low-dose Precedex -We will continue to monitor closely  Sepsis -Sepsis from E. coli bacteremia -Follow-up on cultures-showing E. coli, on cefepime, sensitivities pending -We will continue antibiotics-discontinue vancomycin and Flagyl -Fluid resuscitation -Monitor lactate  Inguinal hydrocele -Will follow with urology -Appears benign on CT  History of bipolar disorder  -We will maintain him on his chronic medication once he is more awake and alert  history of cirrhosis -Follow LFTs-elevated AST, ALT-will continue to monitor  Thrombocytopenia -Likely related to his alcoholism -Has no evidence of acute bleeding -Will continue to follow  History of cocaine use disorder History of chronic hepatitis C -Continue to monitor  Macerated skin around groin -May be fungal, start on miconazole cream 1/23  Best practice:  Diet: N.p.o. Pain/Anxiety/Delirium protocol (if indicated): Benzodiazepines VAP protocol (if indicated): n/a DVT prophylaxis: scd GI prophylaxis: pepcid Glucose control:  Mobility: Bedrest Code Status: Full code Family Communication: No family at bedside Disposition: Will remain in ICU at present  Labs   CBC: Recent Labs  Lab 02/20/18 1157 02/21/18 0209  WBC 6.1 9.3  NEUTROABS 4.4  --   HGB 12.7* 9.3*  HCT 39.2 29.0*  MCV 104.0* 105.5*  PLT 19* 10*    Basic Metabolic Panel: Recent Labs  Lab 02/20/18 1157 02/21/18 0209  NA 131* 134*  K 3.1* 3.6  CL 95* 104  CO2 23 19*  GLUCOSE 116* 90  BUN 16 21*  CREATININE 1.30* 1.60*  CALCIUM 8.3* 7.0*  MG  --  1.1*  PHOS   --  4.4   GFR: Estimated Creatinine Clearance: 49.1 mL/min (A) (by C-G formula based on SCr of 1.6 mg/dL (H)). Recent Labs  Lab 02/20/18 1157 02/20/18 1300 02/20/18 1602 02/21/18 0209 02/21/18 0719  WBC 6.1  --   --  9.3  --   LATICACIDVEN  --  4.1* 9.6*  --  2.3*    Liver Function Tests: Recent Labs  Lab 02/20/18 1157 02/20/18 1741  AST 163* 187*  ALT 61* 53*  ALKPHOS 104 85  BILITOT 3.3* 2.8*  PROT 9.1* 7.9  ALBUMIN 2.8* 2.4*   No results for input(s): LIPASE, AMYLASE in the last 168 hours. No results for input(s): AMMONIA in the last 168 hours.  ABG    Component Value Date/Time   PHART 7.419 02/20/2018 2220   PCO2ART 32.9 02/20/2018 2220   PO2ART 84.0 02/20/2018 2220   HCO3 20.9 02/20/2018 2220   TCO2 26 12/08/2012 1143   ACIDBASEDEF 2.5 (H) 02/20/2018 2220   O2SAT 95.7 02/20/2018 2220     Coagulation Profile: Recent Labs  Lab 02/20/18 1526  INR 1.44    Cardiac Enzymes: No results for input(s): CKTOTAL, CKMB, CKMBINDEX, TROPONINI in the last 168 hours.  HbA1C: Hgb A1c MFr Bld  Date/Time Value Ref Range Status  04/21/2014 10:37 AM 5.7 (H) <5.7 % Final    Comment:                                                                           According to the ADA Clinical Practice Recommendations for 2011, when HbA1c is used as a screening test:     >=6.5%   Diagnostic of Diabetes Mellitus            (if abnormal result is confirmed)   5.7-6.4%   Increased risk of developing Diabetes Mellitus   References:Diagnosis and Classification of Diabetes Mellitus,Diabetes Care,2011,34(Suppl 1):S62-S69 and Standards of Medical Care in         Diabetes - 2011,Diabetes Care,2011,34 (Suppl 1):S11-S61.       CBG: No results for input(s): GLUCAP in the last 168 hours.  Review of Systems:   Review of Systems  Unable to perform ROS: Acuity of condition  Constitutional: Positive for malaise/fatigue.  HENT: Negative.   Eyes: Negative.   Respiratory: Negative  for cough and shortness of breath.   Cardiovascular: Negative.  Negative for chest pain.  Gastrointestinal: Negative.   Genitourinary: Negative.   Musculoskeletal: Negative.   Skin: Negative.   Psychiatric/Behavioral: Negative.    Past Medical History  He,  has a past medical history of Bipolar disorder (HCC), Blindness of left eye, Cirrhosis of liver (HCC), Depression, Hepatitis C, and Polysubstance abuse (HCC).   Surgical History    Past Surgical History:  Procedure Laterality Date  . EYE SURGERY       Social History   reports that he has been smoking  cigarettes. He has a 40.00 pack-year smoking history. He has never used smokeless tobacco. He reports current alcohol use of about 8.0 standard drinks of alcohol per week. He reports current drug use. Drugs: IV and Cocaine.   Family History   His family history includes Diabetes in his brother.   Allergies No Known Allergies    Critical care time: 30 minutes spent reviewing records, formulating plan of care Risk of decompensation is high

## 2018-02-21 NOTE — Progress Notes (Signed)
PHARMACY - PHYSICIAN COMMUNICATION CRITICAL VALUE ALERT - BLOOD CULTURE IDENTIFICATION (BCID)  Albert Salazar is an 63 y.o. male who presented to [facility] on 02/20/2018 with a chief complaint of groin pain/sepsis  Assessment:  Concern for hydrocele or scrotal cellulitis (include suspected source if known) 4 of 8 bottles e-coli  Name of physician (or Provider) Contacted: Dr. Warrick Parisian  Current antibiotics: Cefepime, Vancomycin, Flagyl   Changes to prescribed antibiotics recommended:  Recommendations declined by provider due to would like to keep empiric coverage for now  Consider narrowing to Rocephin when appropriate  Results for orders placed or performed during the hospital encounter of 02/20/18  Blood Culture ID Panel (Reflexed) (Collected: 02/20/2018 12:56 PM)  Result Value Ref Range   Enterococcus species NOT DETECTED NOT DETECTED   Listeria monocytogenes NOT DETECTED NOT DETECTED   Staphylococcus species NOT DETECTED NOT DETECTED   Staphylococcus aureus (BCID) NOT DETECTED NOT DETECTED   Streptococcus species NOT DETECTED NOT DETECTED   Streptococcus agalactiae NOT DETECTED NOT DETECTED   Streptococcus pneumoniae NOT DETECTED NOT DETECTED   Streptococcus pyogenes NOT DETECTED NOT DETECTED   Acinetobacter baumannii NOT DETECTED NOT DETECTED   Enterobacteriaceae species DETECTED (A) NOT DETECTED   Enterobacter cloacae complex NOT DETECTED NOT DETECTED   Escherichia coli DETECTED (A) NOT DETECTED   Klebsiella oxytoca NOT DETECTED NOT DETECTED   Klebsiella pneumoniae NOT DETECTED NOT DETECTED   Proteus species NOT DETECTED NOT DETECTED   Serratia marcescens NOT DETECTED NOT DETECTED   Carbapenem resistance NOT DETECTED NOT DETECTED   Haemophilus influenzae NOT DETECTED NOT DETECTED   Neisseria meningitidis NOT DETECTED NOT DETECTED   Pseudomonas aeruginosa NOT DETECTED NOT DETECTED   Candida albicans NOT DETECTED NOT DETECTED   Candida glabrata NOT DETECTED NOT DETECTED   Candida  krusei NOT DETECTED NOT DETECTED   Candida parapsilosis NOT DETECTED NOT DETECTED   Candida tropicalis NOT DETECTED NOT DETECTED    Lorenza Evangelist 02/21/2018  2:55 AM

## 2018-02-21 NOTE — Progress Notes (Signed)
CRITICAL VALUE ALERT  Critical Value:  Lactic Acid 2.3  Date & Time Notied:  02/21/18 0900  Provider Notified: Wynona Neat  Orders Received/Actions taken: Trending down no new orders.

## 2018-02-21 NOTE — Progress Notes (Signed)
eLink Physician-Brief Progress Note Patient Name: Dominique Forlenza DOB: 1955-11-04 MRN: [id number]   Date of Service  02/21/2018  HPI/Events of Note  K+ 3.6, Mg++ 1.1  eICU Interventions  Elink electrolyte replacement protocol for K+ and Mg ++ ordered        Migdalia Dk 02/21/2018, 3:05 AM

## 2018-02-22 LAB — CBC WITH DIFFERENTIAL/PLATELET
Abs Immature Granulocytes: 0.05 10*3/uL (ref 0.00–0.07)
Basophils Absolute: 0 10*3/uL (ref 0.0–0.1)
Basophils Relative: 0 %
Eosinophils Absolute: 0.1 10*3/uL (ref 0.0–0.5)
Eosinophils Relative: 2 %
HCT: 30 % — ABNORMAL LOW (ref 39.0–52.0)
Hemoglobin: 9.4 g/dL — ABNORMAL LOW (ref 13.0–17.0)
Immature Granulocytes: 1 %
Lymphocytes Relative: 19 %
Lymphs Abs: 1.2 10*3/uL (ref 0.7–4.0)
MCH: 34.4 pg — ABNORMAL HIGH (ref 26.0–34.0)
MCHC: 31.3 g/dL (ref 30.0–36.0)
MCV: 109.9 fL — ABNORMAL HIGH (ref 80.0–100.0)
Monocytes Absolute: 0.6 10*3/uL (ref 0.1–1.0)
Monocytes Relative: 10 %
NEUTROS ABS: 4.2 10*3/uL (ref 1.7–7.7)
Neutrophils Relative %: 68 %
Platelets: 11 10*3/uL — CL (ref 150–400)
RBC: 2.73 MIL/uL — ABNORMAL LOW (ref 4.22–5.81)
RDW: 15.8 % — ABNORMAL HIGH (ref 11.5–15.5)
WBC: 6.1 10*3/uL (ref 4.0–10.5)
nRBC: 0.3 % — ABNORMAL HIGH (ref 0.0–0.2)

## 2018-02-22 LAB — COMPREHENSIVE METABOLIC PANEL
ALBUMIN: 2.1 g/dL — AB (ref 3.5–5.0)
ALT: 50 U/L — ABNORMAL HIGH (ref 0–44)
AST: 170 U/L — AB (ref 15–41)
Alkaline Phosphatase: 47 U/L (ref 38–126)
Anion gap: 5 (ref 5–15)
BUN: 29 mg/dL — ABNORMAL HIGH (ref 6–20)
CO2: 21 mmol/L — ABNORMAL LOW (ref 22–32)
Calcium: 7.1 mg/dL — ABNORMAL LOW (ref 8.9–10.3)
Chloride: 113 mmol/L — ABNORMAL HIGH (ref 98–111)
Creatinine, Ser: 1.24 mg/dL (ref 0.61–1.24)
GFR calc Af Amer: >60 mL/min (ref >60)
GFR calc non Af Amer: >60 mL/min (ref >60)
Glucose, Bld: 110 mg/dL — ABNORMAL HIGH (ref 70–99)
Potassium: 3.8 mmol/L (ref 3.5–5.1)
Sodium: 139 mmol/L (ref 135–145)
Total Bilirubin: 1.7 mg/dL — ABNORMAL HIGH (ref 0.3–1.2)
Total Protein: 6.5 g/dL (ref 6.5–8.1)

## 2018-02-22 LAB — LACTIC ACID, PLASMA
Lactic Acid, Venous: 1.8 mmol/L (ref 0.5–1.9)
Lactic Acid, Venous: 2 mmol/L (ref 0.5–1.9)

## 2018-02-22 MED ORDER — DEXTROSE-NACL 5-0.9 % IV SOLN
INTRAVENOUS | Status: DC
  Administered 2018-02-22 – 2018-02-23 (×2): via INTRAVENOUS

## 2018-02-22 MED ORDER — HYDRALAZINE HCL 20 MG/ML IJ SOLN: 10.0000 mg | INTRAMUSCULAR | Status: DC | PRN

## 2018-02-22 MED ORDER — SODIUM CHLORIDE 0.9 % IV BOLUS
1000.0000 mL | Freq: Once | INTRAVENOUS | Status: AC
  Administered 2018-02-22: 1000 mL via INTRAVENOUS

## 2018-02-22 NOTE — Progress Notes (Signed)
CRITICAL VALUE ALERT  Critical Value:  Platelets 11000  Date & Time Notied:  02/22/18 1221  Provider Notified: Wynona Neatlalere  Orders Received/Actions taken: No new orders. MD would like to recheck tomorrow since he has no signs of bleeding.

## 2018-02-22 NOTE — Progress Notes (Signed)
NAME:  Albert Salazar, MRN:  [id number], DOB:  22-Mar-1955, LOS: 2 ADMISSION DATE:  02/20/2018, CONSULTATION DATE:  02/20/18 REFERRING MD: Dr Juleen China.  CHIEF COMPLAINT: Presented with groin pain  Brief History   Presented with groin pain for about a week duration Is an active drinker History of bipolar disorder, left eye blindness History of hepatitis C with polysubstance abuse   History of present illness   Patient is unable to provide any history of present Presented with a week history of groin pain Temperature was borderline when he came in, he did run a temperature 107 following having shaking chills Received fluids Received benzodiazepines for shaking chills and possible withdrawal symptoms Has remained stable Does not appear agitated  Past Medical History   Past Medical History:  Diagnosis Date  . Bipolar disorder (HCC)   . Blindness of left eye   . Cirrhosis of liver (HCC)   . Depression   . Hepatitis C   . Polysubstance abuse (HCC)    Significant Hospital Events   T-max of 107  Consults:  Urology-appears to be a simple hydrocele with no evidence of obstruction  Procedures:  None  Significant Diagnostic Tests:  CT scan of the abdomen significant for hydrocele  Micro Data:  Blood cultures drawn today 02/20/2018-E. coli in blood culture  Antimicrobials:  Cefepime 1/23>>1/24 Vancomycin 1/23>> will discontinue 1/24 Flagyl 1/23>> will discontinue 1/24 Rocephin 1/24>>  Interim history/subjective:  Patient is sedated at present  Rouses easily, gets agitated Hypotension resolved Arouses and interacts  Objective   Blood pressure 129/63, pulse 65, temperature 97.7 F (36.5 C), temperature source Oral, resp. rate (!) 28, height 5\' 9"  (1.753 m), weight 74.9 kg, SpO2 100 %.        Intake/Output Summary (Last 24 hours) at 02/22/2018 1021 Last data filed at 02/22/2018 0932 Gross per 24 hour  Intake 2298.19 ml  Output 1300 ml  Net 998.19 ml   Filed Weights   02/20/18 1700 02/21/18 0500 02/22/18 0321  Weight: 72.1 kg 74.1 kg 74.9 kg    Examination: General: Elderly gentleman, sedated HENT: moist oral mucosa Lungs: Occasional rhonchi, weak cough cardiovascular: S1-S2 appreciated Abdomen: Bowel sounds appreciated Extremities: No edema, no clubbing Neuro: Sedated  Resolved Hospital Problem list     Assessment & Plan:   Alcohol withdrawal/DTs -We will continue with withdrawal protocol -Currently on low-dose Precedex -PRN Ativan -Will continue to monitor closely -Will wean off sedation as tolerated  Sepsis -Sepsis from E. coli bacteremia -Follow-up on cultures-showing E. coli, on Rocephin -Fluid resuscitation  Inguinal hydrocele -Appears benign on CT -Urology followed  History of bipolar disorder  -We will maintain him on his chronic medication once he is more awake and alert  history of cirrhosis -Follow LFTs-elevated AST, ALT-will continue to monitor -We will follow levels today  Thrombocytopenia -Likely related to his alcoholism -Has no evidence of acute bleeding -Will continue to follow  Anemia -Related to his alcoholism -Anemia chronic disease  History of cocaine use disorder History of chronic hepatitis C -Continue to monitor  Macerated skin around groin -on miconazole cream 1/23  We will wean sedation Liquid diet if tolerated Follow-up on sensitivities from cultures  Best practice:  Diet: N.p.o.-we will attempt to change this today to liquid Pain/Anxiety/Delirium protocol (if indicated): Benzodiazepines VAP protocol (if indicated): n/a DVT prophylaxis: scd GI prophylaxis: pepcid Glucose control:  Mobility: Bedrest Code Status: Full code Family Communication: No family at bedside Disposition: Will remain in ICU at present  Labs  CBC: Recent Labs  Lab 02/20/18 1157 02/21/18 0209  WBC 6.1 9.3  NEUTROABS 4.4  --   HGB 12.7* 9.3*  HCT 39.2 29.0*  MCV 104.0* 105.5*  PLT 19* 10*    Basic  Metabolic Panel: Recent Labs  Lab 02/20/18 1157 02/21/18 0209  NA 131* 134*  K 3.1* 3.6  CL 95* 104  CO2 23 19*  GLUCOSE 116* 90  BUN 16 21*  CREATININE 1.30* 1.60*  CALCIUM 8.3* 7.0*  MG  --  1.1*  PHOS  --  4.4   GFR: Estimated Creatinine Clearance: 49.1 mL/min (A) (by C-G formula based on SCr of 1.6 mg/dL (H)). Recent Labs  Lab 02/20/18 1157 02/20/18 1300 02/20/18 1602 02/21/18 0209 02/21/18 0719  WBC 6.1  --   --  9.3  --   LATICACIDVEN  --  4.1* 9.6*  --  2.3*    Liver Function Tests: Recent Labs  Lab 02/20/18 1157 02/20/18 1741  AST 163* 187*  ALT 61* 53*  ALKPHOS 104 85  BILITOT 3.3* 2.8*  PROT 9.1* 7.9  ALBUMIN 2.8* 2.4*   No results for input(s): LIPASE, AMYLASE in the last 168 hours. No results for input(s): AMMONIA in the last 168 hours.  ABG    Component Value Date/Time   PHART 7.419 02/20/2018 2220   PCO2ART 32.9 02/20/2018 2220   PO2ART 84.0 02/20/2018 2220   HCO3 20.9 02/20/2018 2220   TCO2 26 12/08/2012 1143   ACIDBASEDEF 2.5 (H) 02/20/2018 2220   O2SAT 95.7 02/20/2018 2220     Coagulation Profile: Recent Labs  Lab 02/20/18 1526  INR 1.44    Cardiac Enzymes: No results for input(s): CKTOTAL, CKMB, CKMBINDEX, TROPONINI in the last 168 hours.  HbA1C: Hgb A1c MFr Bld  Date/Time Value Ref Range Status  04/21/2014 10:37 AM 5.7 (H) <5.7 % Final    Comment:                                                                           According to the ADA Clinical Practice Recommendations for 2011, when HbA1c is used as a screening test:     >=6.5%   Diagnostic of Diabetes Mellitus            (if abnormal result is confirmed)   5.7-6.4%   Increased risk of developing Diabetes Mellitus   References:Diagnosis and Classification of Diabetes Mellitus,Diabetes Care,2011,34(Suppl 1):S62-S69 and Standards of Medical Care in         Diabetes - 2011,Diabetes Care,2011,34 (Suppl 1):S11-S61.       CBG: No results for input(s): GLUCAP in  the last 168 hours.  Review of Systems:   Review of Systems  Unable to perform ROS: Acuity of condition  Constitutional: Negative for malaise/fatigue.  HENT: Negative.   Eyes: Negative.   Respiratory: Negative for cough and shortness of breath.   Cardiovascular: Negative.  Negative for chest pain.  Gastrointestinal: Negative.   Genitourinary: Negative.   Musculoskeletal: Negative.   Skin: Negative.   Neurological: Positive for weakness.  Psychiatric/Behavioral: Negative.    Past Medical History  He,  has a past medical history of Bipolar disorder (HCC), Blindness of left eye, Cirrhosis of liver (HCC), Depression, Hepatitis C, and  Polysubstance abuse (HCC).   Surgical History    Past Surgical History:  Procedure Laterality Date  . EYE SURGERY       Social History   reports that he has been smoking cigarettes. He has a 40.00 pack-year smoking history. He has never used smokeless tobacco. He reports current alcohol use of about 8.0 standard drinks of alcohol per week. He reports current drug use. Drugs: IV and Cocaine.   Family History   His family history includes Diabetes in his brother.   Allergies No Known Allergies    Critical care time: 30 minutes of critical care time spent evaluating patient, reviewing records, formulating plan of care  Multiple comorbidities with significant risk of decompensation

## 2018-02-22 NOTE — Evaluation (Signed)
Clinical/Bedside Swallow Evaluation Patient Details  Name: Albert Salazar MRN: [id number] Date of Birth: Jan 11, 1956  Today's Date: 02/22/2018 Time: SLP Start Time (ACUTE ONLY): 1415 SLP Stop Time (ACUTE ONLY): 1429 SLP Time Calculation (min) (ACUTE ONLY): 14 min  Past Medical History:  Past Medical History:  Diagnosis Date  . Bipolar disorder (HCC)   . Blindness of left eye   . Cirrhosis of liver (HCC)   . Depression   . Hepatitis C   . Polysubstance abuse [facility])    Past Surgical History:  Past Surgical History:  Procedure Laterality Date  . EYE SURGERY     HPI:  63 y.o. male admitted with alcohol withdrawal/DTs, sepsis, inguinal hydrocele, hx of cirrhosis and bipolar disorder.  Noted to be coughing on secretions, therefore swallow evaluation was ordered.    Assessment / Plan / Recommendation Clinical Impression  Pt presents with a likely transient dysphagia related to poor mental status.  He was arousable for assessment, followed basic oral motor commands and stated name/DOB.  Trials of ice chips and water led to immediate, explosive coughing and wet "gurgling" at level of larynx, concerning for gross aspiration.  PO trials were discontinued; oral suctioning provided.  RR increased to mid 30s, then dropped to low 20s.  Pt continued to cough intermittently with secretions after assessment was completed.  Recommend NPO until mental status improves.  D/W RN.  SLP will follow for PO readiness.  SLP Visit Diagnosis: Dysphagia, unspecified (R13.10)    Aspiration Risk  Severe aspiration risk    Diet Recommendation   npo       Other  Recommendations Oral Care Recommendations: Oral care QID   Follow up Recommendations Other (comment)(tba)      Frequency and Duration min 2x/week  2 weeks       Prognosis Prognosis for Safe Diet Advancement: Good      Swallow Study   General Date of Onset: 02/20/18 HPI: 63 y.o. male admitted with alcohol withdrawal/DTs, sepsis, inguinal  hydrocele, hx of cirrhosis and bipolar disorder.  Noted to be coughing on secretions, therefore swallow evaluation was ordered.  Type of Study: Bedside Swallow Evaluation Previous Swallow Assessment: no Diet Prior to this Study: Thin liquids Temperature Spikes Noted: No Respiratory Status: Nasal cannula History of Recent Intubation: No Behavior/Cognition: Lethargic/Drowsy Oral Cavity Assessment: Within Functional Limits Oral Care Completed by SLP: No Oral Cavity - Dentition: Missing dentition Vision: (unknown) Self-Feeding Abilities: Total assist Patient Positioning: Upright in bed Baseline Vocal Quality: Normal Volitional Cough: Cognitively unable to elicit Volitional Swallow: Unable to elicit    Oral/Motor/Sensory Function Overall Oral Motor/Sensory Function: Within functional limits   Ice Chips Ice chips: Impaired Presentation: Spoon Pharyngeal Phase Impairments: Throat Clearing - Immediate   Thin Liquid Thin Liquid: Impaired Presentation: Cup Pharyngeal  Phase Impairments: Cough - Immediate    Nectar Thick Nectar Thick Liquid: Not tested   Honey Thick Honey Thick Liquid: Not tested   Puree Puree: Not tested   Solid     Solid: Not tested      Blenda Mounts Laurice 02/22/2018,2:29 PM   Marchelle Folks L. Samson Frederic, MA CCC/SLP Acute Rehabilitation Services Office number [phone] Pager [phone]

## 2018-02-23 ENCOUNTER — Inpatient Hospital Stay (HOSPITAL_COMMUNITY): Payer: Medicaid Other

## 2018-02-23 DIAGNOSIS — M7989 Other specified soft tissue disorders: Secondary | ICD-10-CM

## 2018-02-23 DIAGNOSIS — R7881 Bacteremia: Secondary | ICD-10-CM

## 2018-02-23 LAB — CULTURE, BLOOD (ROUTINE X 2)
Special Requests: ADEQUATE
Special Requests: ADEQUATE
Special Requests: ADEQUATE

## 2018-02-23 LAB — CBC WITH DIFFERENTIAL/PLATELET
Abs Immature Granulocytes: 0.02 10*3/uL (ref 0.00–0.07)
Basophils Absolute: 0 10*3/uL (ref 0.0–0.1)
Basophils Relative: 0 %
EOS ABS: 0 10*3/uL (ref 0.0–0.5)
Eosinophils Relative: 0 %
HEMATOCRIT: 36.2 % — AB (ref 39.0–52.0)
Hemoglobin: 11.1 g/dL — ABNORMAL LOW (ref 13.0–17.0)
IMMATURE GRANULOCYTES: 0 %
Lymphocytes Relative: 26 %
Lymphs Abs: 1.3 10*3/uL (ref 0.7–4.0)
MCH: 33.9 pg (ref 26.0–34.0)
MCHC: 30.7 g/dL (ref 30.0–36.0)
MCV: 110.7 fL — ABNORMAL HIGH (ref 80.0–100.0)
Monocytes Absolute: 0.6 10*3/uL (ref 0.1–1.0)
Monocytes Relative: 11 %
Neutro Abs: 3.2 10*3/uL (ref 1.7–7.7)
Neutrophils Relative %: 63 %
Platelets: 12 10*3/uL — CL (ref 150–400)
RBC: 3.27 MIL/uL — ABNORMAL LOW (ref 4.22–5.81)
RDW: 15.4 % (ref 11.5–15.5)
WBC: 5.1 10*3/uL (ref 4.0–10.5)
nRBC: 0.4 % — ABNORMAL HIGH (ref 0.0–0.2)

## 2018-02-23 MED ORDER — RISPERIDONE 1 MG PO TABS: 1.0000 mg | ORAL_TABLET | Freq: Every day | ORAL | Status: DC

## 2018-02-23 MED ORDER — MIDAZOLAM HCL 2 MG/2ML IJ SOLN
1.0000 mg | INTRAMUSCULAR | Status: DC | PRN
  Administered 2018-02-24: 1 mg via INTRAVENOUS
  Filled 2018-02-23 (×2): qty 2

## 2018-02-23 MED ORDER — LEVALBUTEROL HCL 0.63 MG/3ML IN NEBU
INHALATION_SOLUTION | RESPIRATORY_TRACT | Status: AC
  Filled 2018-02-23: qty 3

## 2018-02-23 MED ORDER — HYDRALAZINE HCL 20 MG/ML IJ SOLN
20.0000 mg | INTRAMUSCULAR | Status: DC | PRN
  Administered 2018-02-23 – 2018-02-27 (×11): 20 mg via INTRAVENOUS
  Filled 2018-02-23 (×11): qty 1

## 2018-02-23 MED ORDER — MIDAZOLAM HCL 2 MG/2ML IJ SOLN
INTRAMUSCULAR | Status: AC
  Filled 2018-02-23: qty 2

## 2018-02-23 MED ORDER — MIDAZOLAM HCL 2 MG/2ML IJ SOLN
1.0000 mg | Freq: Once | INTRAMUSCULAR | Status: AC
  Administered 2018-02-23: 1 mg via INTRAVENOUS

## 2018-02-23 MED ORDER — RISPERIDONE 0.25 MG PO TABS: 0.5000 mg | ORAL_TABLET | Freq: Once | ORAL | Status: DC

## 2018-02-23 MED ORDER — LEVALBUTEROL HCL 0.63 MG/3ML IN NEBU
0.6300 mg | INHALATION_SOLUTION | RESPIRATORY_TRACT | Status: DC | PRN
  Administered 2018-02-23 – 2018-02-24 (×2): 0.63 mg via RESPIRATORY_TRACT
  Filled 2018-02-23: qty 3

## 2018-02-23 NOTE — Progress Notes (Addendum)
NAME:  Albert Salazar, MRN:  [id number], DOB:  10-Sep-1955, LOS: 3 ADMISSION DATE:  02/20/2018, CONSULTATION DATE:  02/20/18 REFERRING MD: Dr Juleen China.  CHIEF COMPLAINT: Presented with groin pain  Brief History   Presented with groin pain for about a week duration Is an active drinker History of bipolar disorder, left eye blindness History of hepatitis C with polysubstance abuse   History of present illness   Patient is unable to provide any history of present Presented with a week history of groin pain Temperature was borderline when he came in, he did run a temperature 107 following having shaking chills Received fluids Received benzodiazepines for shaking chills and possible withdrawal symptoms Has remained stable Does not appear agitated  Past Medical History   Past Medical History:  Diagnosis Date  . Bipolar disorder (HCC)   . Blindness of left eye   . Cirrhosis of liver (HCC)   . Depression   . Hepatitis C   . Polysubstance abuse (HCC)    Significant Hospital Events   Still requiring sedation, gets agitated on occasion  Consults:  Urology-appears to be a simple hydrocele with no evidence of obstruction  Procedures:  None  Significant Diagnostic Tests:  CT scan of the abdomen significant for hydrocele  Micro Data:  Blood cultures drawn 02/20/2018-E. coli in blood culture  Antimicrobials:  Cefepime 1/23>>1/24 Vancomycin 1/23>> will discontinue 1/24 Flagyl 1/23>> will discontinue 1/24 Rocephin 1/24>>  Interim history/subjective:  Patient is sedated at present  Rouses easily, gets agitated Hypotension resolved  Objective   Blood pressure (!) 157/80, pulse (!) 49, temperature 97.6 F (36.4 C), temperature source Axillary, resp. rate (!) 22, height 5\' 9"  (1.753 m), weight 77.5 kg, SpO2 100 %.        Intake/Output Summary (Last 24 hours) at 02/23/2018 1050 Last data filed at 02/23/2018 1000 Gross per 24 hour  Intake 2043.02 ml  Output 870 ml  Net 1173.02 ml    Filed Weights   02/21/18 0500 02/22/18 0321 02/23/18 0500  Weight: 74.1 kg 74.9 kg 77.5 kg    Examination: General: Elderly gentleman, sedated HENT: Moist oral mucosa, no JVD, no thyromegaly Lungs: Still does have rhonchi cardiovascular: S1-S2, no murmur Abdomen: Bowel sounds appreciated Extremities: No edema, no clubbing Neuro: Sedated   Resolved Hospital Problem list     Assessment & Plan:   Alcohol withdrawal/DTs -We will continue with withdrawal protocol -Currently on low-dose Precedex -PRN Ativan -Will continue to monitor closely  -Plan to wean off sedation  Sepsis -Sepsis from E. coli bacteremia -cultures-showing E. coli, on Rocephin-day 3 of Rocephin -Fluid resuscitation  Inguinal hydrocele -Appears benign on CT -Urology followed  History of bipolar disorder  -We will maintain him on his chronic medication once he is more awake and alert -Will start on Risperdal  history of cirrhosis -Follow LFTs-elevated AST, ALT-will continue to monitor -We will follow levels today  Thrombocytopenia-count of 11 -Likely related to his alcoholism -No evidence of bleeding -Will continue to follow  Anemia -Related to his alcoholism -Anemia chronic disease  History of cocaine use disorder History of chronic hepatitis C -Continue to monitor  Macerated skin around groin -miconazole cream 1/23  Urinary retention -High residual of 800 -Place Foley in the short-term  Wean sedation Continue antibiotics  Patient not able to take in orally May need antipsychotic for agitation  Best practice:  Diet: Liquid diet as tolerated  pain/Anxiety/Delirium protocol (if indicated): Benzodiazepines VAP protocol (if indicated): n/a DVT prophylaxis: scd GI prophylaxis: pepcid  Glucose control:  Mobility: Bedrest Code Status: Full code Family Communication: No family at bedside Disposition: ICU Labs   CBC: Recent Labs  Lab 02/20/18 1157 02/21/18 0209 02/22/18 1104   WBC 6.1 9.3 6.1  NEUTROABS 4.4  --  4.2  HGB 12.7* 9.3* 9.4*  HCT 39.2 29.0* 30.0*  MCV 104.0* 105.5* 109.9*  PLT 19* 10* 11*    Basic Metabolic Panel: Recent Labs  Lab 02/20/18 1157 02/21/18 0209 02/22/18 1057  NA 131* 134* 139  K 3.1* 3.6 3.8  CL 95* 104 113*  CO2 23 19* 21*  GLUCOSE 116* 90 110*  BUN 16 21* 29*  CREATININE 1.30* 1.60* 1.24  CALCIUM 8.3* 7.0* 7.1*  MG  --  1.1*  --   PHOS  --  4.4  --    GFR: Estimated Creatinine Clearance: 63.4 mL/min (by C-G formula based on SCr of 1.24 mg/dL). Recent Labs  Lab 02/20/18 1157  02/20/18 1602 02/21/18 0209 02/21/18 0719 02/22/18 1104 02/22/18 1303  WBC 6.1  --   --  9.3  --  6.1  --   LATICACIDVEN  --    < > 9.6*  --  2.3* 1.8 2.0*   < > = values in this interval not displayed.    Liver Function Tests: Recent Labs  Lab 02/20/18 1157 02/20/18 1741 02/22/18 1057  AST 163* 187* 170*  ALT 61* 53* 50*  ALKPHOS 104 85 47  BILITOT 3.3* 2.8* 1.7*  PROT 9.1* 7.9 6.5  ALBUMIN 2.8* 2.4* 2.1*   No results for input(s): LIPASE, AMYLASE in the last 168 hours. No results for input(s): AMMONIA in the last 168 hours.  ABG    Component Value Date/Time   PHART 7.419 02/20/2018 2220   PCO2ART 32.9 02/20/2018 2220   PO2ART 84.0 02/20/2018 2220   HCO3 20.9 02/20/2018 2220   TCO2 26 12/08/2012 1143   ACIDBASEDEF 2.5 (H) 02/20/2018 2220   O2SAT 95.7 02/20/2018 2220     Coagulation Profile: Recent Labs  Lab 02/20/18 1526  INR 1.44    Cardiac Enzymes: No results for input(s): CKTOTAL, CKMB, CKMBINDEX, TROPONINI in the last 168 hours.  HbA1C: Hgb A1c MFr Bld  Date/Time Value Ref Range Status  04/21/2014 10:37 AM 5.7 (H) <5.7 % Final    Comment:                                                                           According to the ADA Clinical Practice Recommendations for 2011, when HbA1c is used as a screening test:     >=6.5%   Diagnostic of Diabetes Mellitus            (if abnormal result is  confirmed)   5.7-6.4%   Increased risk of developing Diabetes Mellitus   References:Diagnosis and Classification of Diabetes Mellitus,Diabetes Care,2011,34(Suppl 1):S62-S69 and Standards of Medical Care in         Diabetes - 2011,Diabetes Care,2011,34 (Suppl 1):S11-S61.       CBG: No results for input(s): GLUCAP in the last 168 hours.  Review of Systems:   Review of Systems  Unable to perform ROS: Mental status change  Constitutional: Negative for malaise/fatigue.  HENT: Negative.   Eyes: Negative.  Cardiovascular: Negative.  Negative for chest pain.  Gastrointestinal: Negative.   Genitourinary: Negative.   Musculoskeletal: Negative.   Skin: Negative.   Neurological: Positive for weakness.  Psychiatric/Behavioral: Negative.    Past Medical History  He,  has a past medical history of Bipolar disorder (HCC), Blindness of left eye, Cirrhosis of liver (HCC), Depression, Hepatitis C, and Polysubstance abuse (HCC).   Surgical History    Past Surgical History:  Procedure Laterality Date  . EYE SURGERY       Social History   reports that he has been smoking cigarettes. He has a 40.00 pack-year smoking history. He has never used smokeless tobacco. He reports current alcohol use of about 8.0 standard drinks of alcohol per week. He reports current drug use. Drugs: IV and Cocaine.   Family History   His family history includes Diabetes in his brother.   Allergies No Known Allergies    Critical care time: 30 minutes of critical care Risk of decompensation still quite significant

## 2018-02-23 NOTE — Progress Notes (Signed)
SLP Cancellation Note  Patient Details Name: Albert Salazar MRN: [id number] DOB: July 23, 1955   Cancelled treatment:       Reason Eval/Treat Not Completed: Medical issues which prohibited therapy. Discussed with RN, who shares that pt's mentation remains the same today. He is still not ready for POs, but SLP will continue to follow for when he is.   [name] 02/23/2018, 8:59 AM  Maxcine Ham Shakerria Parran, M.A. CCC-SLP Acute Herbalist [phone] Office [phone]

## 2018-02-23 NOTE — Progress Notes (Signed)
*  PRELIMINARY RESULTS* Vascular Ultrasound Right upper extremity venous duplex has been completed.  Please see CV Proc tab for preliminary results.  Albert DonningCharlotte C Shanae Salazar 02/23/2018, 1:25 PM

## 2018-02-23 NOTE — Progress Notes (Addendum)
eLink Physician-Brief Progress Note Patient Name: Albert Salazar DOB: 1955-06-18 MRN: [id number]   Date of Service  02/23/2018  HPI/Events of Note  Multiple issues: Dilirium - Precedex IV infusion restarted and 2. Whezing.  eICU Interventions  Will order: 1. Versed 1 mg IV Q 2 hours PRN agitation.   2. Xopenex 0.63 mg via neb Q 3 hours PRN wheezing.      Intervention Category Major Interventions: Delirium, psychosis, severe agitation - evaluation and management  [name] 02/23/2018, 9:07 PM

## 2018-02-23 NOTE — Progress Notes (Signed)
At 2030 pt became very restless and agitated, HR in 150s and RR in 50s. Precedex was restarted and Elink called and Dr. Arsenio Loader ordered a dose of 1mg  of versed and xopenex for wheezing. He also put in prn orders for versed. Dr. Arsenio Loader aware pt is maxed out on precedex at 0.7. Pt currently has mitts on and still occassionally pulling at things and being verbally and physically abusive. Will continue to monitor.

## 2018-02-24 LAB — BASIC METABOLIC PANEL
Anion gap: 6 (ref 5–15)
BUN: 20 mg/dL (ref 6–20)
CHLORIDE: 116 mmol/L — AB (ref 98–111)
CO2: 19 mmol/L — ABNORMAL LOW (ref 22–32)
Calcium: 7.9 mg/dL — ABNORMAL LOW (ref 8.9–10.3)
Creatinine, Ser: 0.9 mg/dL (ref 0.61–1.24)
GFR calc Af Amer: >60 mL/min (ref >60)
GFR calc non Af Amer: >60 mL/min (ref >60)
Glucose, Bld: 121 mg/dL — ABNORMAL HIGH (ref 70–99)
Potassium: 3.5 mmol/L (ref 3.5–5.1)
Sodium: 141 mmol/L (ref 135–145)

## 2018-02-24 LAB — CBC
HCT: 34.7 % — ABNORMAL LOW (ref 39.0–52.0)
Hemoglobin: 10.5 g/dL — ABNORMAL LOW (ref 13.0–17.0)
MCH: 33.9 pg (ref 26.0–34.0)
MCHC: 30.3 g/dL (ref 30.0–36.0)
MCV: 111.9 fL — ABNORMAL HIGH (ref 80.0–100.0)
Platelets: 12 10*3/uL — CL (ref 150–400)
RBC: 3.1 MIL/uL — ABNORMAL LOW (ref 4.22–5.81)
RDW: 15.9 % — ABNORMAL HIGH (ref 11.5–15.5)
WBC: 5.2 10*3/uL (ref 4.0–10.5)
nRBC: 0.6 % — ABNORMAL HIGH (ref 0.0–0.2)

## 2018-02-24 LAB — PHOSPHORUS: Phosphorus: 2.4 mg/dL — ABNORMAL LOW (ref 2.5–4.6)

## 2018-02-24 LAB — MAGNESIUM: Magnesium: 1.7 mg/dL (ref 1.7–2.4)

## 2018-02-24 MED ORDER — CHLORDIAZEPOXIDE HCL 25 MG PO CAPS: 25.0000 mg | ORAL_CAPSULE | Freq: Four times a day (QID) | ORAL | Status: DC | PRN

## 2018-02-24 MED ORDER — CHLORDIAZEPOXIDE HCL 25 MG PO CAPS
50.0000 mg | ORAL_CAPSULE | Freq: Once | ORAL | Status: AC
  Administered 2018-02-24: 50 mg via ORAL
  Filled 2018-02-24: qty 2

## 2018-02-24 MED ORDER — HYDROXYZINE HCL 25 MG PO TABS
25.0000 mg | ORAL_TABLET | Freq: Four times a day (QID) | ORAL | Status: DC | PRN
  Administered 2018-02-25: 25 mg via ORAL
  Filled 2018-02-24: qty 1

## 2018-02-24 MED ORDER — CHLORDIAZEPOXIDE HCL 25 MG PO CAPS
25.0000 mg | ORAL_CAPSULE | ORAL | Status: AC
  Administered 2018-02-26 – 2018-02-27 (×2): 25 mg via ORAL
  Filled 2018-02-24 (×2): qty 1

## 2018-02-24 MED ORDER — POLYETHYLENE GLYCOL 3350 17 G PO PACK
17.0000 g | PACK | Freq: Every day | ORAL | Status: DC
  Administered 2018-02-24: 17 g via ORAL
  Filled 2018-02-24: qty 1

## 2018-02-24 MED ORDER — CHLORDIAZEPOXIDE HCL 25 MG PO CAPS
25.0000 mg | ORAL_CAPSULE | Freq: Three times a day (TID) | ORAL | Status: AC
  Administered 2018-02-25 – 2018-02-26 (×3): 25 mg via ORAL
  Filled 2018-02-24 (×3): qty 1

## 2018-02-24 MED ORDER — DEXTROSE IN LACTATED RINGERS 5 % IV SOLN
INTRAVENOUS | Status: DC
  Administered 2018-02-24 – 2018-02-26 (×2): via INTRAVENOUS

## 2018-02-24 MED ORDER — ONDANSETRON 4 MG PO TBDP: 4.0000 mg | ORAL_TABLET | Freq: Four times a day (QID) | ORAL | Status: DC | PRN

## 2018-02-24 MED ORDER — BISACODYL 5 MG PO TBEC
5.0000 mg | DELAYED_RELEASE_TABLET | Freq: Every day | ORAL | Status: DC | PRN
  Administered 2018-02-24: 5 mg via ORAL
  Filled 2018-02-24: qty 1

## 2018-02-24 MED ORDER — CHLORDIAZEPOXIDE HCL 25 MG PO CAPS
25.0000 mg | ORAL_CAPSULE | Freq: Every day | ORAL | Status: AC
  Administered 2018-02-27: 25 mg via ORAL
  Filled 2018-02-24: qty 1

## 2018-02-24 MED ORDER — TAMSULOSIN HCL 0.4 MG PO CAPS
0.4000 mg | ORAL_CAPSULE | Freq: Every day | ORAL | Status: DC
  Administered 2018-02-24 – 2018-03-06 (×10): 0.4 mg via ORAL
  Filled 2018-02-24 (×10): qty 1

## 2018-02-24 MED ORDER — LOPERAMIDE HCL 2 MG PO CAPS: 2.0000 mg | ORAL_CAPSULE | ORAL | Status: DC | PRN

## 2018-02-24 MED ORDER — CHLORDIAZEPOXIDE HCL 25 MG PO CAPS
25.0000 mg | ORAL_CAPSULE | Freq: Four times a day (QID) | ORAL | Status: AC
  Administered 2018-02-24 – 2018-02-25 (×4): 25 mg via ORAL
  Filled 2018-02-24 (×5): qty 1

## 2018-02-24 NOTE — Progress Notes (Signed)
eLink Physician-Brief Progress Note Patient Name: Albert Salazar DOB: Jun 03, 1955 MRN: [id number]   Date of Service  02/24/2018  HPI/Events of Note  Agitation - Request for Posey belt.   eICU Interventions  Will order Posey belt.      Intervention Category Minor Interventions: Agitation / anxiety - evaluation and management  Kourosh Jablonsky Eugene 02/24/2018, 10:18 PM

## 2018-02-24 NOTE — Progress Notes (Signed)
Order for discontinue SLP received.  Please reorder if indicated.  Thanks.  [name], MS [facility] SLP Acute Rehab Services Pager [phone] Office [phone]

## 2018-02-24 NOTE — Progress Notes (Addendum)
NAME:  Albert Salazar, MRN:  [id number], DOB:  [birth year], LOS: 4 ADMISSION DATE:  02/20/2018, CONSULTATION DATE:  02/20/18 REFERRING MD: Dr Juleen China.  CHIEF COMPLAINT: Presented with groin pain  Brief History   63 year old male patient who presented with week history of groin pain, fever, chills.  Admitted with working diagnosis of sepsis and alcohol withdrawal with delirium tremens on 1/23  Past Medical History  History of bipolar disease, blind left eye, cirrhosis, hep C, polysubstance abuse.  Significant Hospital Events   1/23 admitted with sepsis and alcohol withdrawal.  Temperature documented as high as 107.7, however the not clear if this was an error did have chills and fever however.  Cultures were obtained he was started on empiric antibiotics and IV hydration.  seen by urology, CT scan revealed benign prostatic hypertrophy and right inguinal fluid collection was found to have a right hydrocele and felt to be not related to his presentation 1/24: Blood culture showing E. coli, hypotension resolved 1/25: Sedated on Precedex agitated easily 1/26: Remains on Precedex Consults:  Urology-appears to be a simple hydrocele with no evidence of obstruction  Procedures:  None  Significant Diagnostic Tests:  CT scan of the abdomen significant for hydrocele  Micro Data:  Blood cultures drawn 02/20/2018-E. coli in blood culture  Antimicrobials:  Cefepime 1/23>>1/24 Vancomycin 1/23>> will discontinue 1/24 Flagyl 1/23>> will discontinue 1/24 Rocephin 1/24>>  Interim history/subjective:  Still agitated requiring frequent reorientation  Objective   Blood pressure (Abnormal) 131/98, pulse 85, temperature (Abnormal) 97.4 F (36.3 C), temperature source Axillary, resp. rate 18, height 5\' 9"  (1.753 m), weight 78.2 kg, SpO2 100 %.        Intake/Output Summary (Last 24 hours) at 02/24/2018 1040 Last data filed at 02/24/2018 1000 Gross per 24 hour  Intake 2022.31 ml  Output 1275 ml  Net  747.31 ml   Filed Weights   02/22/18 0321 02/23/18 0500 02/24/18 0500  Weight: 74.9 kg 77.5 kg 78.2 kg    Examination: General: This is a chronically ill 63 year old male he is currently agitated and confused HEENT blind left eye, otherwise normocephalic, mucous membranes moist Pulmonary: Scattered rhonchi no accessory use Cardiac: Tachycardic regular rate and rhythm Abdomen: Mildly distended, firm, positive bowel sounds. GU: Clear yellow Derm:, penis and scrotum excoriated Neuro: Awake, agitated, moves all extremities. Extremities: Warm and dry brisk capillary refill  Resolved Hospital Problem list     Assessment & Plan:   Alcohol withdrawal/DTs Plan Continuing Precedex Initiate Librium with goal to have overlap Safety sitter as needed Continuing thiamine and folate  Sepsis with E. coli bacteremia Urinary tract likely source Plan Day #5 antibiotics, repeating blood cultures today, likely complete 8 days Will look at abdomen with ultrasound to ensure no ascites  Inguinal hydrocele Plan Felt benign nothing to do  History of bipolar disorder  Plan Risperdal Resume home meds when able  Non-anion gap metabolic acidosis in setting of hyperchloremia Plan Change D5 normal to D5 lactated Ringer's Repeat chemistry in a.m.  history of cirrhosis, and hepatitis C Plan Repeat LFTs in a.m.  Thrombocytopenia-count of 11 -Likely related to his alcoholism Plan Trend CBC  Anemia of chronic disease Plan Trend CBC  History of cocaine use disorder Plan Supportive care  Macerated skin around groin Plan Continue miconazole cream  Urinary retention -High residual of 800 Plan Continuing Foley catheter Add Flomax    Best practice:  Diet: Liquid diet as tolerated  pain/Anxiety/Delirium protocol (if indicated): Benzodiazepines VAP protocol (if indicated): n/a DVT  prophylaxis: scd GI prophylaxis: pepcid Glucose control:  Mobility: Bedrest Code Status: Full  code Family Communication: No family at bedside Disposition: He remains critically ill due to his severe delirium and agitation we are titrating Precedex, adding Librium, and continuing to treat for sepsis.  Critical care time: 35 minutes

## 2018-02-24 NOTE — Progress Notes (Signed)
Patient was having minimal urine output from foley catheter. Bladder scan performed and read >912mL. Charge RN informed and attempted to irrigate foley catheter without success. Foley catheter removed. Upon removal, patient had about 30 mL of urine output with blood clots. Critical care NP made aware and orders received to replace foley catheter.  Foley catheter replaced, immediate urine return noted.  Will continue to monitor.

## 2018-02-25 ENCOUNTER — Inpatient Hospital Stay (HOSPITAL_COMMUNITY): Payer: Medicaid Other

## 2018-02-25 LAB — CBC
HCT: 33.3 % — ABNORMAL LOW (ref 39.0–52.0)
Hemoglobin: 10.2 g/dL — ABNORMAL LOW (ref 13.0–17.0)
MCH: 34.2 pg — ABNORMAL HIGH (ref 26.0–34.0)
MCHC: 30.6 g/dL (ref 30.0–36.0)
MCV: 111.7 fL — ABNORMAL HIGH (ref 80.0–100.0)
PLATELETS: 11 10*3/uL — AB (ref 150–400)
RBC: 2.98 MIL/uL — ABNORMAL LOW (ref 4.22–5.81)
RDW: 15.8 % — ABNORMAL HIGH (ref 11.5–15.5)
WBC: 4.5 10*3/uL (ref 4.0–10.5)
nRBC: 0.4 % — ABNORMAL HIGH (ref 0.0–0.2)

## 2018-02-25 LAB — COMPREHENSIVE METABOLIC PANEL
ALT: 38 U/L (ref 0–44)
AST: 70 U/L — ABNORMAL HIGH (ref 15–41)
Albumin: 1.9 g/dL — ABNORMAL LOW (ref 3.5–5.0)
Alkaline Phosphatase: 51 U/L (ref 38–126)
Anion gap: 4 — ABNORMAL LOW (ref 5–15)
BUN: 18 mg/dL (ref 6–20)
CO2: 22 mmol/L (ref 22–32)
Calcium: 8 mg/dL — ABNORMAL LOW (ref 8.9–10.3)
Chloride: 117 mmol/L — ABNORMAL HIGH (ref 98–111)
Creatinine, Ser: 0.76 mg/dL (ref 0.61–1.24)
GFR calc Af Amer: >60 mL/min (ref >60)
GFR calc non Af Amer: >60 mL/min (ref >60)
Glucose, Bld: 134 mg/dL — ABNORMAL HIGH (ref 70–99)
Potassium: 3.3 mmol/L — ABNORMAL LOW (ref 3.5–5.1)
SODIUM: 143 mmol/L (ref 135–145)
Total Bilirubin: 1.6 mg/dL — ABNORMAL HIGH (ref 0.3–1.2)
Total Protein: 6.1 g/dL — ABNORMAL LOW (ref 6.5–8.1)

## 2018-02-25 LAB — MAGNESIUM: Magnesium: 1.5 mg/dL — ABNORMAL LOW (ref 1.7–2.4)

## 2018-02-25 MED ORDER — POTASSIUM CHLORIDE 10 MEQ/100ML IV SOLN
10.0000 meq | INTRAVENOUS | Status: AC
  Administered 2018-02-25 (×2): 10 meq via INTRAVENOUS
  Filled 2018-02-25 (×2): qty 100

## 2018-02-25 MED ORDER — CLONIDINE HCL 0.1 MG PO TABS
0.1000 mg | ORAL_TABLET | Freq: Three times a day (TID) | ORAL | Status: DC
  Administered 2018-02-25 – 2018-03-03 (×15): 0.1 mg via ORAL
  Filled 2018-02-25 (×15): qty 1

## 2018-02-25 MED ORDER — POTASSIUM CHLORIDE CRYS ER 20 MEQ PO TBCR
40.0000 meq | EXTENDED_RELEASE_TABLET | Freq: Once | ORAL | Status: AC
  Administered 2018-02-25: 40 meq via ORAL
  Filled 2018-02-25: qty 2

## 2018-02-25 NOTE — Progress Notes (Signed)
[facility] ADULT ICU REPLACEMENT PROTOCOL FOR AM LAB REPLACEMENT ONLY  The patient does apply for the [facility] Adult ICU Electrolyte Replacment Protocol based on the criteria listed below:   1. Is GFR >/= 40 ml/min? Yes.    Patient's GFR today is >60 2. Is urine output >/= 0.5 ml/kg/hr for the last 6 hours? Yes.   Patient's UOP is 0.8 ml/kg/hr 3. Is BUN < 60 mg/dL? Yes.    Patient's BUN today is 18 4. Abnormal electrolyte  K 3.3 5. Ordered repletion with: per protocol 6. If a panic level lab has been reported, has the CCM MD in charge been notified? Yes.  .   Physician:  Tonny Branch, Lanora Manis McEachran 02/25/2018 4:20 AM

## 2018-02-25 NOTE — Progress Notes (Signed)
NAME:  Albert Salazar, Albert Salazar, Albert Salazar, LOS: 5 ADMISSION DATE:  02/20/2018, CONSULTATION DATE:  02/20/18 REFERRING MD: Dr Juleen ChinaKohut.  CHIEF COMPLAINT: Presented with groin pain  Brief History   63 year old male patient who presented with week history of groin pain, fever, chills.  Admitted with working diagnosis of sepsis and alcohol withdrawal with delirium tremens on 1/23  Past Medical History  History of bipolar disease, blind left eye, cirrhosis, hep C, polysubstance abuse.  Significant Hospital Events   1/23 admitted with sepsis and alcohol withdrawal.  Temperature documented as high as 107.7, however the not clear if this was an error did have chills and fever however.  Cultures were obtained he was started on empiric antibiotics and IV hydration.  seen by urology, CT scan revealed benign prostatic hypertrophy and right inguinal fluid collection was found to have a right hydrocele and felt to be not related to his presentation 1/24: Blood culture showing E. coli, hypotension resolved 1/25: Sedated on Precedex agitated easily 1/27: Remains on Precedex. Started librium 1/28: Add clonidine, continue Librium and try to taper Precedex off.  Awaiting blood cultures as follow-up Consults:  Urology-appears to be a simple hydrocele with no evidence of obstruction  Procedures:  None  Significant Diagnostic Tests:  CT scan of the abdomen significant for hydrocele  Micro Data:  Blood cultures drawn 02/20/2018-E. coli in blood culture  Antimicrobials:  Cefepime 1/23>>1/24 Vancomycin 1/23>> will discontinue 1/24 Flagyl 1/23>> will discontinue 1/24 Rocephin 1/24>>  Interim history/subjective:  Restless at times. Still confused   Objective   Blood pressure (Abnormal) 181/88, pulse 65, temperature (Abnormal) 97.5 F (36.4 C), temperature source Axillary, resp. rate 19, height 5\' 9"  (1.753 m), weight 94.8 kg, SpO2 100 %.        Intake/Output Summary (Last 24 hours) at  02/25/2018 [id number] Last data filed at 02/25/2018 0600 Gross per 24 hour  Intake 1548.38 ml  Output 1425 ml  Net 123.38 ml   Filed Weights   02/23/18 0500 02/24/18 0500 02/25/18 0500  Weight: 77.5 kg 78.2 kg 94.8 kg    Examination: General: 63 year old male patient remains confused, but agitation a little better today HEENT normocephalic atraumatic no jugular venous distention Pulmonary: Scattered rhonchi no accessory use Cardiac: Regular rate and rhythm Abdomen: Soft nontender, no organomegaly Extremities: Multiple scabbed over wounds over legs.  No significant edema lowers.  Right upper extremity is swollen ultrasound was negative Neuro: Awake, follows commands, speech slurred.  Oriented x1. GU: Clear yellow Resolved Hospital Problem list     Assessment & Plan:   Alcohol withdrawal/DTs -seems a little better  Plan Cont precedex Cont librium  Titrate clonidine up Safety precuations Thiamine and folate   Sepsis with E. coli bacteremia Urinary tract likely source Plan Day 6 abx Await f/u BCs  Inguinal hydrocele Plan Follow as out-pt   History of bipolar disorder  Plan Cont Risperdal   Fluid and electrolyte imbalance: Non-anion gap metabolic acidosis in setting of hyperchloremia and also hypokalemia  Plan Cont LR Repeat chem in am  Replace K  history of cirrhosis, and hepatitis C Plan Repeat lfts intermittently   Thrombocytopenia-count of 11 -Likely related to his alcoholism number -appears to be chronic with baseline in the 70s Plan Consider heme consult (could be done as he gets better)  Anemia of chronic disease Plan Trend cbc   History of cocaine use disorder Plan Supportive care  Macerated skin around groin Plan Miconazole cream  Urinary retention -High residual  of 800 Plan Keep Foley in place Continue Flomax  RUE swelling -[place] neg Plan Elevate     Best practice:  Diet: Liquid diet as tolerated  pain/Anxiety/Delirium protocol (if  indicated): Benzodiazepines VAP protocol (if indicated): n/a DVT prophylaxis: scd GI prophylaxis: pepcid Glucose control:  Mobility: Bedrest Code Status: Full code Family Communication: No family at bedside Disposition: Remains critically ill due to severe delirium requiring titration of Precedex.   Critical care time: 33 minutes

## 2018-02-26 LAB — BASIC METABOLIC PANEL
Anion gap: 4 — ABNORMAL LOW (ref 5–15)
BUN: 17 mg/dL (ref 6–20)
CO2: 21 mmol/L — ABNORMAL LOW (ref 22–32)
CREATININE: 0.88 mg/dL (ref 0.61–1.24)
Calcium: 7.9 mg/dL — ABNORMAL LOW (ref 8.9–10.3)
Chloride: 115 mmol/L — ABNORMAL HIGH (ref 98–111)
GFR calc Af Amer: >60 mL/min (ref >60)
GFR calc non Af Amer: >60 mL/min (ref >60)
Glucose, Bld: 96 mg/dL (ref 70–99)
Potassium: 3.6 mmol/L (ref 3.5–5.1)
SODIUM: 140 mmol/L (ref 135–145)

## 2018-02-26 LAB — MAGNESIUM: Magnesium: 1.3 mg/dL — ABNORMAL LOW (ref 1.7–2.4)

## 2018-02-26 MED ORDER — SODIUM CHLORIDE 0.9 % IV SOLN
6.0000 g | Freq: Once | INTRAVENOUS | Status: AC
  Administered 2018-02-26: 6 g via INTRAVENOUS
  Filled 2018-02-26: qty 12

## 2018-02-26 MED ORDER — ACETAMINOPHEN 650 MG RE SUPP: 650.0000 mg | RECTAL | Status: DC | PRN

## 2018-02-26 MED ORDER — ACETAMINOPHEN 650 MG RE SUPP
650.0000 mg | Freq: Four times a day (QID) | RECTAL | Status: DC | PRN
  Administered 2018-02-26 – 2018-03-03 (×3): 650 mg via RECTAL
  Filled 2018-02-26 (×2): qty 1

## 2018-02-26 NOTE — Progress Notes (Signed)
[facility] ADULT ICU REPLACEMENT PROTOCOL FOR AM LAB REPLACEMENT ONLY  The patient does apply for the [facility] Adult ICU Electrolyte Replacment Protocol based on the criteria listed below:   1. Is GFR >/= 40 ml/min? Yes.    Patient's GFR today is >60 2. Is urine output >/= 0.5 ml/kg/hr for the last 6 hours? Yes.   Patient's UOP is 52.5 ml/kg/hr 3. Is BUN < 60 mg/dL? Yes.    Patient's BUN today is 17 4. Abnormal electrolyte(s): Magnesium 1.3 5. Ordered repletion with: Magnesium 6 gms over 6hrs x 1 dose 6. If a panic level lab has been reported, has the CCM MD in charge been notified? Yes.  .   Physician:  Dr. Orma Render, Jerardo Costabile Seromines 02/26/2018 5:40 AM

## 2018-02-26 NOTE — Progress Notes (Signed)
eLink Physician-Brief Progress Note Patient Name: Albert Salazar DOB: 11/11/1955 MRN: [id number]   Date of Service  02/26/2018  HPI/Events of Note  Pt complaining of mild headache and hip pain  eICU Interventions  Tylenol suppository 650 mg rectally Q 4 hours prn        Migdalia Dk 02/26/2018, 12:39 AM

## 2018-02-26 NOTE — Evaluation (Addendum)
Clinical/Bedside Swallow Evaluation Patient Details  Name: Albert HedgeDavid Salazar MRN: [id number] Date of Birth: [birth year]  Today's Date: 02/26/2018 Time: SLP Start Time (ACUTE ONLY): 1340 SLP Stop Time (ACUTE ONLY): 1420 SLP Time Calculation (min) (ACUTE ONLY): 40 min  Past Medical History:  Past Medical History:  Diagnosis Date  . Bipolar disorder (HCC)   . Blindness of left eye   . Cirrhosis of liver (HCC)   . Depression   . Hepatitis C   . Polysubstance abuse [facility](HCC)    Past Surgical History:  Past Surgical History:  Procedure Laterality Date  . EYE SURGERY     HPI:  63 y.o. male admitted with alcohol withdrawal/DTs, sepsis, inguinal hydrocele, hx of cirrhosis and bipolar disorder.  Per nurse, noted to be coughing while eating, and CT showed new airspace opacities primarily in the right upper lobe. Therefore swallow evaluation was re-ordered.    Assessment / Plan / Recommendation Clinical Impression   Pt presents with a likely transient dysphagia related to altered mental status (confused, slightly agitated).  He followed basic oral motor commands and stated name/DOB.  Oral care was completed with suction. Patient had low intensity, hoarse vocal quality throughout the evaluation, however the etiology is unclear (aspiration vs secondary to heavy tobacco use). While PO trials including ice chips, thin liquid, puree, and solid were WFL during this evaluation, nurse reports coughing during and after po trials today. In addition, CT shows new airspace opacities primarily in the right upper lobe.   MBS is recommended to rule out aspiration and determine least restrictive diet. Pt may have ice chips with full supervision, immediately following oral care, when alert. RN was informed of results and recommendations. Will complete MBS for Thursday 02/27/2018 at 0800.  SLP Visit Diagnosis: Dysphagia, unspecified (R13.10)    Aspiration Risk    Results pending MBS.    Diet Recommendation NPO;Ice chips  PRN after oral care(Further recommendations pending MBS)        Other  Recommendations   Results pending MBS.  Follow up Recommendations   Schedule MBS for 02/27/18     Frequency and Duration   NA         Prognosis Prognosis for Safe Diet Advancement: Fair Barriers to Reach Goals: Behavior;Other (Comment) Barriers/Prognosis Comment: aggitation      Swallow Study   General HPI: 63 y.o. male admitted with alcohol withdrawal/DTs, sepsis, inguinal hydrocele, hx of cirrhosis and bipolar disorder.  Per nurse, noted to be coughing while eating, and CT showed new airspace opacities primarily in the right upper lobe. Therefore swallow evaluation was re-ordered.  Type of Study: Bedside Swallow Evaluation Previous Swallow Assessment: NA Diet Prior to this Study: NPO Temperature Spikes Noted: No Respiratory Status: Room air History of Recent Intubation: No Behavior/Cognition: Alert;Agitated;Confused Oral Cavity Assessment: Within Functional Limits Oral Care Completed by SLP: Yes Oral Cavity - Dentition: Poor condition;Missing dentition Self-Feeding Abilities: Total assist Patient Positioning: Upright in bed Baseline Vocal Quality: Hoarse;Low vocal intensity Volitional Cough: Weak    Oral/Motor/Sensory Function Overall Oral Motor/Sensory Function: Within functional limits(Presented with tremor-like movements of the tongue and jaw when asked to perform isolated movements. )   Ice Chips Ice chips: Within functional limits Presentation: Spoon   Thin Liquid Thin Liquid: Within functional limits(Nurse reported coughing during meals.) Presentation: Straw;Spoon    Nectar Thick   NT  Honey Thick   NT  Puree Puree: Within functional limits Presentation: Spoon Other Comments: Nurse reported coughing during meals    Solid  Solid: Within functional limits Presentation: Self Fed Other Comments: Nurse reported coughing during meals      Herbert SetaKaitlynn Plaskett, B.A.  Graduate Student  Clinician   Kaitlynn Plaskett 02/26/2018,3:50 PM  Celia B. Murvin NatalBueche, [facility] Laser Center LLCMSP, CCC-SLP Speech Language Pathologist [phone]

## 2018-02-26 NOTE — Care Management Note (Signed)
Case Management Note  Patient Details  Name: Albert Salazar MRN: [id number] Date of Birth: [birth year]  Subjective/Objective:                  Discharge Readiness Return to top of Delirium RRG - ISC  Discharge readiness is indicated by patient meeting Recovery Milestones, including ALL of the following: ? Delirium resolved or manageable[R]  Iv precedex drip ? No dangerous behavior[S] for at least 24 hours agitated on iv precedex drip ? Thoughts of suicide or Harm absent or manageable at lower level of care no ? Medical comorbidities, adverse medication events, and substance use absent or treatable at lower level of care ? Etoh w/d ? Functional status impairment absent or adequate self-care support planned at lower level of care no ? Patient can participate (eg, verify absence of plan for harm) in needed monitoring no ? Level of care=icu   Action/Plan: Following for progression of care. Following for cm needs none present at this time.  Expected Discharge Date:  (unknown)               Expected Discharge Plan:     In-House Referral:     Discharge planning Services     Post Acute Care Choice:    Choice offered to:     DME Arranged:    DME Agency:     HH Arranged:    HH Agency:     Status of Service:     If discussed at [employer], dates discussed:    Additional Comments:  Golda Acre, RN 02/26/2018, 9:38 AM

## 2018-02-26 NOTE — Progress Notes (Addendum)
NAME:  Albert Salazar, MRN:  [id number], DOB:  1955/06/08, LOS: 6 ADMISSION DATE:  02/20/2018, CONSULTATION DATE:  02/20/18 REFERRING MD: Dr Juleen China.  CHIEF COMPLAINT: Presented with groin pain  Brief History   63 year old male patient who presented with week history of groin pain, fever, chills.  Admitted with working diagnosis of sepsis and alcohol withdrawal with delirium tremens on 1/23  Past Medical History  History of bipolar disease, blind left eye, cirrhosis, hep C, polysubstance abuse.  Significant Hospital Events   1/23 admitted with sepsis and alcohol withdrawal.  Temperature documented as high as 107.7, however the not clear if this was an error did have chills and fever however.  Cultures were obtained he was started on empiric antibiotics and IV hydration.  seen by urology, CT scan revealed benign prostatic hypertrophy and right inguinal fluid collection was found to have a right hydrocele and felt to be not related to his presentation 1/24: Blood culture showing E. coli, hypotension resolved 1/25: Sedated on Precedex agitated easily 1/27: Remains on Precedex. Started librium 1/28: Add clonidine, continue Librium and try to taper Precedex off.  Awaiting blood cultures as follow-up 1/29: Librium and clonidine continued.  Precedex discontinued. Consults:  Urology-appears to be a simple hydrocele with no evidence of obstruction  Procedures:  None  Significant Diagnostic Tests:  CT scan of the abdomen significant for hydrocele  Micro Data:  Blood cultures drawn 02/20/2018-E. coli in blood culture  Antimicrobials:  Cefepime 1/23>>1/24 Vancomycin 1/23>> will discontinue 1/24 Flagyl 1/23>> will discontinue 1/24 Rocephin 1/24>>  Interim history/subjective:  Restless at times. Still confused   Objective   Blood pressure (Abnormal) 166/68, pulse 81, temperature 97.8 F (36.6 C), temperature source Oral, resp. rate (Abnormal) 23, height 5\' 9"  (1.753 m), weight (Abnormal) 211 kg,  SpO2 98 %.        Intake/Output Summary (Last 24 hours) at 02/26/2018 5956 Last data filed at 02/26/2018 0741 Gross per 24 hour  Intake 2124.3 ml  Output 825 ml  Net 1299.3 ml   Filed Weights   02/24/18 0500 02/25/18 0500 02/26/18 0500  Weight: 78.2 kg 94.8 kg (Abnormal) 211 kg    Examination: General: Chronically ill-appearing 63 year old male he seems calmer than he had over the last 48 hours HEENT normocephalic atraumatic no jugular venous distention Pulmonary: Some scattered rhonchi improved with cough.  He Has no accessory use. Cardiac: Regular rate and rhythm Abdomen: Soft nontender no organomegaly Extremities: Multiple scabs over lower extremities brisk cap refill right upper extremity looks improved compared to day prior Neuro: Awake, speech slurred but more briskly arousable moves all extremities a little Colmer oriented x1 GU clear yellow. Resolved Hospital Problem list     Assessment & Plan:   Alcohol withdrawal/DTs -seems a little better  Plan Discontinue Precedex Continue Librium taper Continue Catapres 0.1 mg 3 times daily Get out of bed Frequent reorientation  Possible aspiration New right sided airspace disease on CXR Plan F/u cbc today Trend fever curve Repeat CXR am  SLP eval   Sepsis with E. coli bacteremia Urinary tract likely source Plan Day #7 of 8 ceftriaxone  Inguinal hydrocele Plan Seen by urology, nothing to do   Fluid and electrolyte imbalance: Non-anion gap metabolic acidosis in setting of hyperchloremia, hypomagnesemia Hyperchloremia improving, acidosis improving.   Plan Continue lactated Ringer's A.m. chemistry Allow oral intake  history of cirrhosis, and hepatitis C Plan Repeat lfts intermittently   Thrombocytopenia-count of 11 -Likely related to his alcoholism number -appears to be chronic  with baseline in the 40-70s dating back to 2017 Plan Repeat a.m. CBC May need to consider hematology consult as outpatient or  prior to discharge  Anemia of chronic disease Plan Trend CBC  History of cocaine use disorder Plan Supportive care  Macerated skin around groin Plan Miconazole cream  Urinary retention -High residual of 800, has been on Flomax for several days now Plan Continue Flomax Discontinue Foley   RUE swelling -[place] neg Plan Elevate    Best practice:  Diet: Liquid diet as tolerated  pain/Anxiety/Delirium protocol (if indicated): Benzodiazepines VAP protocol (if indicated): n/a DVT prophylaxis: scd GI prophylaxis: pepcid Glucose control:  Mobility: Bedrest Code Status: Full code Family Communication: No family at bedside Disposition: Seems to be slowly improving.  We will discontinue his Precedex today, try to advance his activity, continue Librium taper and clonidine.  Internal medicine to resume care effective the 30th   Critical care time: 30 minutes

## 2018-02-27 ENCOUNTER — Inpatient Hospital Stay (HOSPITAL_COMMUNITY): Payer: Medicaid Other

## 2018-02-27 ENCOUNTER — Encounter (HOSPITAL_COMMUNITY): Payer: Self-pay | Admitting: Nephrology

## 2018-02-27 DIAGNOSIS — B182 Chronic viral hepatitis C: Secondary | ICD-10-CM

## 2018-02-27 DIAGNOSIS — F1020 Alcohol dependence, uncomplicated: Secondary | ICD-10-CM

## 2018-02-27 DIAGNOSIS — R7881 Bacteremia: Secondary | ICD-10-CM | POA: Diagnosis present

## 2018-02-27 DIAGNOSIS — D696 Thrombocytopenia, unspecified: Secondary | ICD-10-CM

## 2018-02-27 DIAGNOSIS — J690 Pneumonitis due to inhalation of food and vomit: Secondary | ICD-10-CM | POA: Diagnosis not present

## 2018-02-27 DIAGNOSIS — R0682 Tachypnea, not elsewhere classified: Secondary | ICD-10-CM

## 2018-02-27 LAB — CBC WITH DIFFERENTIAL/PLATELET
ABS IMMATURE GRANULOCYTES: 0.06 10*3/uL (ref 0.00–0.07)
Basophils Absolute: 0 10*3/uL (ref 0.0–0.1)
Basophils Relative: 0 %
Eosinophils Absolute: 0 10*3/uL (ref 0.0–0.5)
Eosinophils Relative: 0 %
HCT: 30.2 % — ABNORMAL LOW (ref 39.0–52.0)
HEMOGLOBIN: 9.4 g/dL — AB (ref 13.0–17.0)
Immature Granulocytes: 1 %
Lymphocytes Relative: 30 %
Lymphs Abs: 1.9 10*3/uL (ref 0.7–4.0)
MCH: 33.9 pg (ref 26.0–34.0)
MCHC: 31.1 g/dL (ref 30.0–36.0)
MCV: 109 fL — ABNORMAL HIGH (ref 80.0–100.0)
Monocytes Absolute: 0.7 10*3/uL (ref 0.1–1.0)
Monocytes Relative: 10 %
NEUTROS ABS: 3.8 10*3/uL (ref 1.7–7.7)
NEUTROS PCT: 59 %
Platelets: 12 10*3/uL — CL (ref 150–400)
RBC: 2.77 MIL/uL — ABNORMAL LOW (ref 4.22–5.81)
RDW: 16.2 % — ABNORMAL HIGH (ref 11.5–15.5)
WBC: 6.4 10*3/uL (ref 4.0–10.5)
nRBC: 0.5 % — ABNORMAL HIGH (ref 0.0–0.2)

## 2018-02-27 LAB — COMPREHENSIVE METABOLIC PANEL
ALK PHOS: 56 U/L (ref 38–126)
ALT: 38 U/L (ref 0–44)
AST: 62 U/L — ABNORMAL HIGH (ref 15–41)
Albumin: 1.8 g/dL — ABNORMAL LOW (ref 3.5–5.0)
Anion gap: 7 (ref 5–15)
BUN: 14 mg/dL (ref 6–20)
CO2: 20 mmol/L — ABNORMAL LOW (ref 22–32)
Calcium: 7.7 mg/dL — ABNORMAL LOW (ref 8.9–10.3)
Chloride: 112 mmol/L — ABNORMAL HIGH (ref 98–111)
Creatinine, Ser: 0.78 mg/dL (ref 0.61–1.24)
GFR calc Af Amer: >60 mL/min (ref >60)
GFR calc non Af Amer: >60 mL/min (ref >60)
Glucose, Bld: 96 mg/dL (ref 70–99)
Potassium: 3 mmol/L — ABNORMAL LOW (ref 3.5–5.1)
Sodium: 139 mmol/L (ref 135–145)
Total Bilirubin: 1.4 mg/dL — ABNORMAL HIGH (ref 0.3–1.2)
Total Protein: 5.8 g/dL — ABNORMAL LOW (ref 6.5–8.1)

## 2018-02-27 LAB — MAGNESIUM: Magnesium: 1.7 mg/dL (ref 1.7–2.4)

## 2018-02-27 LAB — CALCIUM, IONIZED: Calcium, Ionized, Serum: 5 mg/dL (ref 4.5–5.6)

## 2018-02-27 MED ORDER — STERILE WATER FOR INJECTION IV SOLN
INTRAVENOUS | Status: DC
  Administered 2018-02-27 – 2018-02-28 (×2): via INTRAVENOUS
  Filled 2018-02-27 (×3): qty 850

## 2018-02-27 MED ORDER — MAGNESIUM SULFATE 2 GM/50ML IV SOLN
2.0000 g | Freq: Once | INTRAVENOUS | Status: AC
  Administered 2018-02-27: 2 g via INTRAVENOUS

## 2018-02-27 MED ORDER — POTASSIUM CHLORIDE CRYS ER 20 MEQ PO TBCR
40.0000 meq | EXTENDED_RELEASE_TABLET | ORAL | Status: AC
  Administered 2018-02-27 (×2): 40 meq via ORAL
  Filled 2018-02-27 (×2): qty 2

## 2018-02-27 MED ORDER — PIPERACILLIN-TAZOBACTAM 3.375 G IVPB
3.3750 g | Freq: Three times a day (TID) | INTRAVENOUS | Status: DC
  Administered 2018-02-27 – 2018-03-03 (×9): 3.375 g via INTRAVENOUS
  Filled 2018-02-27 (×13): qty 50

## 2018-02-27 NOTE — Progress Notes (Signed)
Bladder scan 307 mL.  MD notified via text page.

## 2018-02-27 NOTE — Evaluation (Signed)
Physical Therapy Evaluation Patient Details Name: Ghali Huffman MRN: [id number] DOB: January 12, 1956 Today's Date: 02/27/2018   History of Present Illness  63 yo male admitted diagnoses of  ETOH withdrawal, UTI, bacteremia, delirium. Hx of polysubstance abuse, bipolar d/o, Hep C L eye blindness.   Clinical Impression  On eval, pt required Total assist +2 for bed mobility and Mod assist +2 to stand at bedside with [facility]. Pt is very weak and at high risk for falls. He is also currently cognitively impaired. No family present during session. Unsure of pt's PLOF-pt is a poor historian. Will follow during hospital stay. Recommendation is for SNF.     Follow Up Recommendations SNF    Equipment Recommendations  Rolling walker with 5" wheels    Recommendations for Other Services       Precautions / Restrictions Precautions Precautions: Fall Restrictions Weight Bearing Restrictions: No      Mobility  Bed Mobility Overal bed mobility: Needs Assistance Bed Mobility: Supine to Sit;Sit to Supine     Supine to sit: Total assist;+2 for physical assistance;+2 for safety/equipment;HOB elevated Sit to supine: Total assist;+2 for physical assistance;+2 for safety/equipment;HOB elevated   General bed mobility comments: Assist for trunk and LEs. Utilized bedpad for scooting, positioning. Multimodal cueing required. .  Transfers Overall transfer level: Needs assistance Equipment used: 2 person hand held assist Transfers: Sit to/from Stand Sit to Stand: Mod assist;+2 physical assistance;+2 safety/equipment;From elevated surface         General transfer comment: Assist to rise, stabilize, control descent. Moderate poserior bias-required assist to prevent fall. Pt stood for ~20 seconds at EOB.   Ambulation/Gait             General Gait Details: NT-pt too weak/unsteady. Also pt declined ambulation.   Stairs            Wheelchair Mobility    Modified Rankin (Stroke Patients Only)        Balance Overall balance assessment: Needs assistance         Standing balance support: Bilateral upper extremity supported Standing balance-Leahy Scale: Poor                               Pertinent Vitals/Pain Pain Assessment: No/denies pain    Home Living Family/patient expects to be discharged to:: Unsure                      Prior Function           Comments: poor historian     Hand Dominance        Extremity/Trunk Assessment   Upper Extremity Assessment Upper Extremity Assessment: Generalized weakness    Lower Extremity Assessment Lower Extremity Assessment: Generalized weakness    Cervical / Trunk Assessment Cervical / Trunk Assessment: Normal  Communication   Communication: Expressive difficulties  Cognition Arousal/Alertness: Awake/alert Behavior During Therapy: Restless Overall Cognitive Status: No family/caregiver present to determine baseline cognitive functioning Area of Impairment: Orientation;Attention;Memory;Following commands;Safety/judgement;Problem solving                 Orientation Level: Disoriented to;Place;Time;Situation Current Attention Level: Focused Memory: Decreased recall of precautions;Decreased short-term memory Following Commands: Follows one step commands inconsistently Safety/Judgement: Decreased awareness of safety;Decreased awareness of deficits   Problem Solving: Slow processing;Difficulty sequencing;Requires verbal cues;Requires tactile cues        General Comments      Exercises     Assessment/Plan  PT Assessment Patient needs continued PT services  PT Problem List Decreased strength;Decreased balance;Decreased range of motion;Decreased cognition;Decreased mobility;Decreased activity tolerance;Decreased coordination;Decreased knowledge of use of DME;Decreased safety awareness;Decreased knowledge of precautions       PT Treatment Interventions DME instruction;Gait  training;Functional mobility training;Therapeutic activities;Balance training;Patient/family education;Therapeutic exercise    PT Goals (Current goals can be found in the Care Plan section)  Acute Rehab PT Goals Patient Stated Goal: none stated PT Goal Formulation: With patient Time For Goal Achievement: 03/13/18 Potential to Achieve Goals: Fair    Frequency Min 2X/week   Barriers to discharge        Co-evaluation               AM-PAC PT "6 Clicks" Mobility  Outcome Measure Help needed turning from your back to your side while in a flat bed without using bedrails?: A Lot Help needed moving from lying on your back to sitting on the side of a flat bed without using bedrails?: A Lot Help needed moving to and from a bed to a chair (including a wheelchair)?: A Lot Help needed standing up from a chair using your arms (e.g., wheelchair or bedside chair)?: A Lot Help needed to walk in hospital room?: Total Help needed climbing 3-5 steps with a railing? : Total 6 Click Score: 10    End of Session Equipment Utilized During Treatment: Gait belt Activity Tolerance: Patient limited by fatigue Patient left: in bed;with call bell/phone within reach;with bed alarm set;with nursing/sitter in room   PT Visit Diagnosis: Muscle weakness (generalized) (M62.81);Difficulty in walking, not elsewhere classified (R26.2);Unsteadiness on feet (R26.81)    Time: [id number] PT Time Calculation (min) (ACUTE ONLY): 13 min   Charges:   PT Evaluation $PT Eval Moderate Complexity: 1 Mod            Rebeca AlertJannie Kaysen Deal, PT Acute Rehabilitation Services Pager: [phone] Office: [phone]

## 2018-02-27 NOTE — Progress Notes (Signed)
Patient arrived on unit via bed from ICU. Sitter at bedside.  No family at bedside.

## 2018-02-27 NOTE — Progress Notes (Signed)
Telemetry placed per MD order and CMT notified. 

## 2018-02-27 NOTE — Progress Notes (Signed)
eLink Physician-Brief Progress Note Patient Name: Albert HedgeDavid Salazar DOB: September 01, 1955 MRN: [id number]   Date of Service  02/27/2018  HPI/Events of Note  Hypokalemia and Hypomag  eICU Interventions  Potassium and mag replaced     Intervention Category Intermediate Interventions: Electrolyte abnormality - evaluation and management  DETERDING,ELIZABETH 02/27/2018, 4:45 AM

## 2018-02-27 NOTE — Progress Notes (Signed)
Pharmacy Antibiotic Note  Albert Salazar is a 63 y.o. male admitted on 02/20/2018 with groin pain and sepsis.  Pharmacy has been consulted for vancomycin and cefepime dosing. Temp 107.8 in ED, CT: BPH, R inguinal fluid collection -concern for hydrocele or scrotal cellulitis - per urology "No worrisome features for infection on CT or exam"- inguinal fluid collection is incidental. Lactate 9.6  Plan: Last Day Ceftriaxone 2gm q24 for E coli bacteremia D1 Zosyn EI added for aspiration PNA, plan 4-5 days per MD note   Height: 5\' 9"  (175.3 cm) Weight: (!) 467 lb 2.5 oz (211.9 kg) IBW/kg (Calculated) : 70.7  Temp (24hrs), Avg:97.9 F (36.6 C), Min:97.4 F (36.3 C), Max:99.5 F (37.5 C)  Recent Labs  Lab 02/20/18 1602  02/21/18 0719 02/22/18 1057 02/22/18 1104 02/22/18 1303 02/23/18 1146 02/24/18 0649 02/25/18 0256 02/26/18 0243 02/27/18 0243  WBC  --    < >  --   --  6.1  --  5.1 5.2 4.5  --  6.4  CREATININE  --    < >  --  1.24  --   --   --  0.90 0.76 0.88 0.78  LATICACIDVEN 9.6*  --  2.3*  --  1.8 2.0*  --   --   --   --   --    < > = values in this interval not displayed.    Estimated Creatinine Clearance: 176.7 mL/min (by C-G formula based on SCr of 0.78 mg/dL).    No Known Allergies  Antimicrobials this admission: 1/23 vanc>> 1/24 1/23 cefepime>> 1/24 1/23 flagyl>> 1/24 1/24 Ceftriaxone >> 1/30 1/30 Zosyn >>   Dose adjustments this admission:  Microbiology results: 1/23 HIV: non-reactive 1/23 BCx:  8/8 bottles GNR     1/24 BCID:  Escherichia coli 1/23 MRSA PCR: neg 1/27 rpt BCx: ngtd  Otho Bellows PharmD Pager [phone] 02/27/2018, 2:42 PM

## 2018-02-27 NOTE — Progress Notes (Signed)
[facility] Progress Note  Subjective: no new issues today  Vitals:   02/27/18 0900 02/27/18 1000 02/27/18 1100 02/27/18 1200  BP: (!) 153/56 (!) 150/77 (!) 160/75 125/85  Pulse:      Resp: (!) 32 (!) 22 15 (!) 23  Temp:    99.5 F (37.5 C)  TempSrc:    Axillary  SpO2:      Weight:      Height:        Inpatient medications: . chlordiazePOXIDE  25 mg Oral Daily  . cloNIDine  0.1 mg Oral TID  . mouth rinse  15 mL Mouth Rinse BID  . miconazole   Topical BID  . polyethylene glycol  17 g Oral Daily  . tamsulosin  0.4 mg Oral Daily  . thiamine  100 mg Oral Daily   Or  . thiamine  100 mg Intravenous Daily   . sodium chloride 10 mL/hr at 02/24/18 1747  . cefTRIAXone (ROCEPHIN)  IV Stopped (02/26/18 1746)  . dextrose 5% lactated ringers 75 mL/hr at 02/27/18 1007  . ibuprofen (CALDOLOR) IV     acetaminophen, bisacodyl, hydrALAZINE, ibuprofen (CALDOLOR) IV, levalbuterol  Exam: Tremulous, talking, responds appopriately, about 50% oriented No jvd Chest cta bilat Cor tachy, no mrg Abd soft, ntnd no mas sor ascites GU normal uncirc male Ext no sig LE edema Neuro mildmod confused, tremulous, nonfocal   Presentation Summary: 63 year old male patient who presented with week history of groin pain, fever, chills.  Admitted with working diagnosis of sepsis and alcohol withdrawal with delirium tremens on 1/23 1/23 admitted with sepsis and alcohol withdrawal.  Temperature documented as high as 107.7, however the not clear if this was an error did have chills and fever however.  Cultures were obtained he was started on empiric antibiotics and IV hydration.  seen by urology, CT scan revealed benign prostatic hypertrophy and right inguinal fluid collection was found to have a right hydrocele and felt to be not related to his presentation 1/24: Blood culture+ E. coli, hypotension resolved 1/25: Sedated on Precedex agitated easily 1/27: Remained on Precedex. Started librium 1/28: Added  clonidine, continued Librium and tried to taper Precedex off.  Awaiting blood cultures as follow-up 1/29: Librium and clonidine continued.  Precedex discontinued.       Hospital Problems/ Course:  Alcohol withdrawal/DT's: prolonged etoh withdrawals/ DT's, still shaking  Continue Librium taper Continue Catapres 0.1 mg 3 times daily Get out of bed if possible Frequent reorientation  ID: EColi bacteremia + new aspiration PNA 1/28 SP 7 day IV Rocephin for Ecoli bacteremia (4/4 blood cx +1/23) Will change to IV zosyn for in-house suspected asp PNA, give 4-5 more days Repeat CXR in am  SLP eval   Inguinal hydrocele Seen by urology, nothing to do  Metabolic acidosis, non AG - switch to IV bicarb gtt  History of cirrhosis, and hepatitis C Repeat lfts intermittently   Thrombocytopenia-count of 11 - Likely related to his alcoholism number - appears to be chronic with baseline in the 40-70s dating back to 2017 - May need to consider hematology consult as outpatient or prior to discharge  Anemia of chronic disease Hb 9-11 here  History of cocaine use disorder Supportive care  Macerated skin around groin Miconazole cream  Urinary retention -High residual of 800, has been on Flomax for several days now Continue Flomax Foley dc'd  RUE swelling - [place] negative for DVT - plan  elevate    Code Status: full DVT Prophylaxis: SCD's Family  Communication: none here Disposition Plan: dc when medically cleared Status: Inpatient, transfer out to floor from ICU today  Vinson Moselle MD [facility] pgr [phone] 02/27/2018, 1:11 PM   Recent Labs  Lab 02/21/18 0209  02/24/18 0649 02/25/18 0256 02/26/18 0243 02/27/18 0243  NA 134*   < > 141 143 140 139  K 3.6   < > 3.5 3.3* 3.6 3.0*  CL 104   < > 116* 117* 115* 112*  CO2 19*   < > 19* 22 21* 20*  GLUCOSE 90   < > 121* 134* 96 96  BUN 21*   < > 20 18 17 14   CREATININE 1.60*   < > 0.90 0.76 0.88 0.78   CALCIUM 7.0*   < > 7.9* 8.0* 7.9* 7.7*  PHOS 4.4  --  2.4*  --   --   --    < > = values in this interval not displayed.   Recent Labs  Lab 02/22/18 1057 02/25/18 0256 02/27/18 0243  AST 170* 70* 62*  ALT 50* 38 38  ALKPHOS 47 51 56  BILITOT 1.7* 1.6* 1.4*  PROT 6.5 6.1* 5.8*  ALBUMIN 2.1* 1.9* 1.8*   Recent Labs  Lab 02/22/18 1104 02/23/18 1146 02/24/18 0649 02/25/18 0256 02/27/18 0243  WBC 6.1 5.1 5.2 4.5 6.4  NEUTROABS 4.2 3.2  --   --  3.8  HGB 9.4* 11.1* 10.5* 10.2* 9.4*  HCT 30.0* 36.2* 34.7* 33.3* 30.2*  MCV 109.9* 110.7* 111.9* 111.7* 109.0*  PLT 11* 12* 12* 11* 12*   Iron/TIBC/Ferritin/ %Sat    Component Value Date/Time   IRON 188 (H) 05/28/2014 1059

## 2018-02-28 ENCOUNTER — Inpatient Hospital Stay (HOSPITAL_COMMUNITY): Payer: Medicaid Other

## 2018-02-28 DIAGNOSIS — F10920 Alcohol use, unspecified with intoxication, uncomplicated: Secondary | ICD-10-CM

## 2018-02-28 DIAGNOSIS — F1410 Cocaine abuse, uncomplicated: Secondary | ICD-10-CM

## 2018-02-28 LAB — BLOOD GAS, ARTERIAL
Acid-Base Excess: 2.3 mmol/L — ABNORMAL HIGH (ref 0.0–2.0)
Bicarbonate: 26.5 mmol/L (ref 20.0–28.0)
Drawn by: 308601
O2 Content: 6 L/min
O2 Saturation: 97 %
Patient temperature: 100.1
pCO2 arterial: 43.6 mmHg (ref 32.0–48.0)
pH, Arterial: 7.406 (ref 7.350–7.450)
pO2, Arterial: 102 mmHg (ref 83.0–108.0)

## 2018-02-28 LAB — BASIC METABOLIC PANEL
Anion gap: 6 (ref 5–15)
BUN: 11 mg/dL (ref 6–20)
CALCIUM: 7.5 mg/dL — AB (ref 8.9–10.3)
CO2: 23 mmol/L (ref 22–32)
Chloride: 110 mmol/L (ref 98–111)
Creatinine, Ser: 0.96 mg/dL (ref 0.61–1.24)
GFR calc Af Amer: >60 mL/min (ref >60)
GFR calc non Af Amer: >60 mL/min (ref >60)
GLUCOSE: 87 mg/dL (ref 70–99)
Potassium: 3.6 mmol/L (ref 3.5–5.1)
Sodium: 139 mmol/L (ref 135–145)

## 2018-02-28 MED ORDER — FUROSEMIDE 10 MG/ML IJ SOLN
40.0000 mg | Freq: Once | INTRAMUSCULAR | Status: AC
  Administered 2018-02-28: 40 mg via INTRAVENOUS
  Filled 2018-02-28: qty 4

## 2018-02-28 MED ORDER — IPRATROPIUM-ALBUTEROL 0.5-2.5 (3) MG/3ML IN SOLN
3.0000 mL | RESPIRATORY_TRACT | Status: DC | PRN
  Administered 2018-02-28 – 2018-03-01 (×3): 3 mL via RESPIRATORY_TRACT
  Filled 2018-02-28 (×3): qty 3

## 2018-02-28 MED ORDER — GLYCOPYRROLATE 0.2 MG/ML IJ SOLN
0.1000 mg | Freq: Two times a day (BID) | INTRAMUSCULAR | Status: DC
  Administered 2018-02-28 – 2018-03-02 (×5): 0.1 mg via INTRAVENOUS
  Filled 2018-02-28 (×6): qty 0.5

## 2018-02-28 MED ORDER — GLYCOPYRROLATE 0.2 MG/ML IJ SOLN
0.1000 mg | Freq: Once | INTRAMUSCULAR | Status: AC
  Administered 2018-02-28: 0.1 mg via INTRAVENOUS
  Filled 2018-02-28: qty 0.5

## 2018-02-28 NOTE — Progress Notes (Signed)
MEWS Guidelines - (patients age 61 and over)Rapid Response & Dr Rana Snare notified and were at bedside. After pt received a duoneb tx, 40mg  iv lasix & an attempted NT sx by RT, foley inserted with good return  pt appears improved, sounds much better / less rhonchi RR = 32 Will continue close monitoring   Red - At High Risk for Deterioration Yellow - At risk for Deterioration  1. Go to room and assess patient 2. Validate data. Is this patient's baseline? If data confirmed: 3. Is this an acute change? 4. Administer prn meds/treatments as ordered. 5. Note Sepsis score 6. Review goals of care 7. Radiation protection practitioner, RRT nurse and Provider. 8. Ask Provider to come to bedside.  9. Document patient condition/interventions/response. 10. Increase frequency of vital signs and focused assessments to at least q15 minutes x 4, then q30 minutes x2. - If stable, then q1h x3, then q4h x3 and then q8h or dept. routine. - If unstable, contact Provider & RRT nurse. Prepare for possible transfer. 11. Add entry in progress notes using the smart phrase ".MEWS". 1. Go to room and assess patient 2. Validate data. Is this patient's baseline? If data confirmed: 3. Is this an acute change? 4. Administer prn meds/treatments as ordered? 5. Note Sepsis score 6. Review goals of care 7. Radiation protection practitioner and Provider 8. Call RRT nurse as needed. 9. Document patient condition/interventions/response. 10. Increase frequency of vital signs and focused assessments to at least q2h x2. - If stable, then q4h x2 and then q8h or dept. routine. - If unstable, contact Provider & RRT nurse. Prepare for possible transfer. 11. Add entry in progress notes using the smart phrase ".MEWS".  Green - Likely stable Lavender - Comfort Care Only  1. Continue routine/ordered monitoring.  2. Review goals of care. 1. Continue routine/ordered monitoring. 2. Review goals of care.

## 2018-02-28 NOTE — Progress Notes (Signed)
Pt nasotracheal suction with 14 french catheter down left nare. Supplemental  Oxygen provided, and Heart rate and saturation monitored. Suction produced small to moderate amount of thick tan secretions. Pt orally suctioned as well. RT will continue to monitor.

## 2018-02-28 NOTE — Progress Notes (Signed)
  Speech Language Pathology Treatment: Dysphagia  Patient Details Name: Albert Salazar MRN: [id number] DOB: 1955-08-04 Today's Date: 02/28/2018 Time: 1150-1230 SLP Time Calculation (min) (ACUTE ONLY): 40 min  Assessment / Plan / Recommendation Clinical Impression  Pt awake in bed with nurse tech in room.  Nurse reported aspiration on thin liquids last night and this morning, but pt took his meds whole in puree without indications of aspiration. Pt appeared with a gurgly breathing and vocal quality which he did not fully clear with cued coughing. He continues to be significantly dysarthric.   Pt requested hot tea, which was thickened to a nectar thick consistency, which he appeared to swallow adequately via teaspoon. He required verbal cues to swallow immediately.  He was given teaspoon bites of applesauce which he appeared to swallow adequately with verbal cues to swallow when he was holding the bolus.  He was encouraged to cough or clear his throat when he initiated with a weak throat clear x2. After PO trials of cracker with applesauce, pt began coughing *concerning for aspiration* and was encouraged to cough and expectorate (appearance of masticated cracker with secretions), which he did with assistance x2.  This appeared to cause mild distress of which patient recovered shortly after expectoration.    He is recommended to NPO with meds crushed in puree due to decreased swallowing abilities and concern for overt aspiration without ability to clear. Dr. [place] has been informed and responded through secure chat about NPO recommendation. Plan is to reassess next date clinically.    HPI HPI: 63 y.o. male admitted with alcohol withdrawal/DTs, sepsis, inguinal hydrocele, hx of cirrhosis and bipolar disorder.  MBS on 02/27/18 showed oropharyngeal phase dysphagia. Per nurse, noted to be coughing while eating with an aspiration event last night.  Nurse also reported pt was coughing after drinking water this  morning, but took pills whole with applesauce.       SLP Plan  Continue with current plan of care       Recommendations  Diet recommendations: NPO;Nectar-thick liquid Liquids provided via: Teaspoon Medication Administration: Crushed with puree Supervision: Full supervision/cueing for compensatory strategies(verbal instruction to swallow when delayed) Compensations: Small sips/bites;Slow rate;Minimize environmental distractions Postural Changes and/or Swallow Maneuvers: Seated upright 90 degrees;Upright 30-60 min after meal                Oral Care Recommendations: Oral care QID SLP Visit Diagnosis: Dysphagia, oropharyngeal phase (R13.12) Plan: Continue with current plan of care       GO                Donni Oglesby 02/28/2018, 1:04 PM  Herbert Seta, Passenger transport manager

## 2018-02-28 NOTE — Significant Event (Signed)
Rapid Response Event Note  Overview: Time Called: 2112 Arrival Time: 2117 Event Type: Respiratory   Called by bedside RN due pt developing respiratory distress with respiratory rate in the 40's on 2 L [place].  Pt has a MEWS of 4.  Pt has had this issue earlier today but seems to may have aspirated today as well.    Initial Focused Assessment:    Neuro:  Pt lethargic but able to follow simple commands.  Oriented to name and place.  Pt grips are weak, also.  Pulmonary:  Pt has labored breathing with coarse breath sounds.  Respiratory rate in 40's.  Cardiac:  NSR on monitor HR in 80's. Generalized edema.  Interventions: MD aware and given update.  Respiratory paged to bedside for breathing treatment, blood gas and NT suction per MD orders. 4 L [place] placed.  Foley placed with prior bladder scan.  Pt positive 8 Liters I/O.  Lasix 40 mg IV given per MD orders via bedside RN.    Plan of Care (if not transferred): Pt looks much better, see VS.  Breathing much clearer.  See MAR.  Will not need to transfer at this time.  Pt bedside nurse assured to call if pt develops any issues that require assistance.      Conley Rolls

## 2018-02-28 NOTE — Progress Notes (Signed)
PROGRESS NOTE    Albert Salazar  ZOX:[id number] DOB: Aug 21, 1955 DOA: 02/20/2018 PCP: Gilda Crease, MD    Brief Narrative:   63 year old male patient who presented with week history of groin pain, fever, chills. Admitted with working diagnosis of sepsis and alcohol withdrawal with delirium tremens on 1/23 1/23 admitted with sepsis and alcohol withdrawal. Temperature documented as high as 107.7, however the not clear if this was an error did have chills and fever however. Cultures were obtained he was started on empiric antibiotics and IV hydration. seen by urology, CT scan revealed benign prostatic hypertrophy and right inguinal fluid collection was found to have a right hydrocele and felt to be not related to his presentation  1/24: Blood culture+ E. coli, hypotension resolved 1/25: Sedated on Precedex agitated easily 1/27: Remained on Precedex. Started librium 1/28: Added clonidine, continued Librium and tried to taper Precedex off. Awaiting blood cultures as follow-up 1/29: Librium and clonidine continued. Precedex discontinued. 1/31: Completed Librium taper, continues on clonidine 0.1 mg p.o. 3 times daily  Assessment & Plan:   Principal Problem:   Alcohol withdrawal (HCC) Active Problems:   Alcohol use disorder, severe, dependence (HCC)   Cocaine use disorder, mild, abuse (HCC)   Chronic hepatitis C without hepatic coma (HCC)   Thrombocytopenia (HCC)   Alcohol intoxication (HCC)   Alcoholic cirrhosis of liver with ascites (HCC)   Aspiration pneumonia (HCC)   Bacteremia due to Gram-negative bacteria   Tachypnea   Alcohol withdrawal/DT's:  Patient admitted with sepsis and likely alcohol withdrawal symptoms.  Initially admitted to the intensive care unit requiring Precedex drip.  Precedex was able to be weaned off with Librium taper and which he completed on 02/27/2018. --Continue Catapres 0.1 mg 3 times daily --Get out of bed if possible --Needs frequent  reorientation  EColi bacteremia  --s/p 7 day IV Rocephin for Ecoli bacteremia (4/4 blood cx +1/23)  Pneumonia, aspiration on 1/28 --Chest x-ray 02/25/2018 with new right upper lobe opacities consistent with likely new aspiration event, also with bilateral basilar opacities consideration of infection versus atelectasis --IV zosyn for in-house suspected asp PNA, give 3-4 more days --Speech therapy following, recommend n.p.o. except for meds at this point --Consulted palliative care for assistance with goals of care/medical decision making  Dysphasia Patient continues with significant oropharyngeal dysphasia, with new aspiration event/pneumonia as above.  Speech therapy following.  Recommend n.p.o. except for meds at this point. --Palliative care consulted for goals of care/medical decision making --If no improvement over next few days, may need to consider PEG tube versus more comfort measures moving forward  Inguinal hydrocele Seen by urology, nothing to do  Metabolic acidosis, non AG --Now resolved, continues on IV bicarb gtt --If CO2 level remains stable, and remains n.p.o. secondary dysphasia, consider transition to D5 half-normal saline versus discussion of possible need for PEG tube placement.  History of cirrhosis, and hepatitis C --continue to monitor LFT's intermittently   Thrombocytopenia-count of 11 --Likely related to his alcoholism number vs worsening secondary to acute infectious process --appears to be chronic with baseline in the40-70s dating back to 2017 --May need to consider hematology consult as outpatient or prior to discharge --No signs of active bleeding --Continue SCDs for DVT prophylaxis as chemical prophylaxis contraindicated with platelets less than 50  Anemia of chronic disease --Stable, Hb 9-11 here  History of cocaine use disorder --Counseled patient and family members present on need for complete cessation, continue supportive  care  Macerated skin around groin --Miconazole cream  Urinary retention --High residual of 800, initially requiring Foley catheter; now discontinued --Continue Flomax  RUE swelling --[place] negative for DVT --plan elevate extremity as needed  DVT prophylaxis: SCDs, chemical DVT prophylaxis contraindicated with a platelet count less than 50 Code Status: Full code Family Communication: Discussed with family members at bedside, 3 cousins in which he lives with 1 of his cousins 44. Disposition Plan: Continue current level of care, PT recommends SNF, case management for coordination   Consultants:   Palliative care  Procedures:     Antimicrobials: (specify start and planned stop date. Auto populated tables are space occupying and do not give end dates)  Ceftriaxone, completed 7-day course  Zosyn,Start: 02/27/2018   Subjective:  Patient seen and examined at bedside.  Resting comfortably in bed.  Has left mitt in place to reduce pulling at medical devices.  3 cousins present.  Patient is verbal but incoherent.  Discussed with his 3 cousins including [name] who he lives with regarding his severe alcohol abuse now causing aspiration pneumonia, dysphasia,  Objective: Vitals:   02/28/18 0032 02/28/18 0612 02/28/18 0925 02/28/18 1337  BP: (!) 146/71 133/63 (!) 160/69 (P) 115/85  Pulse: 89 94 91 (P) 92  Resp:   (!) 24 (!) (P) 24  Temp:   99.5 F (37.5 C) (P) 99.1 F (37.3 C)  TempSrc:   Oral (P) Oral  SpO2:   92% (P) 92%  Weight:  95.9 kg    Height:        Intake/Output Summary (Last 24 hours) at 02/28/2018 1511 Last data filed at 02/27/2018 1730 Gross per 24 hour  Intake 120 ml  Output -  Net 120 ml   Filed Weights   02/26/18 0500 02/27/18 0406 02/28/18 0612  Weight: (!) 211 kg (!) 211.9 kg 95.9 kg    Examination:  General exam: Appears calm and comfortable  Respiratory system: Coarse breath sounds bilaterally, worse on right upper lobe, slightly decreased  bilateral bases, no crackles, normal respiratory effort Cardiovascular system: S1 & S2 heard, RRR. No JVD, murmurs, rubs, gallops or clicks. No pedal edema. Gastrointestinal system: Abdomen is nondistended, soft and nontender. No organomegaly or masses felt. Normal bowel sounds heard. Central nervous system: Alert, aware, not oriented to person/place/time Extremities: Symmetric 5 x 5 power. Skin: No rashes, lesions or ulcers Psychiatry: Unable to ascertain as patient is incoherent    Data Reviewed: I have personally reviewed following labs and imaging studies  CBC: Recent Labs  Lab 02/22/18 1104 02/23/18 1146 02/24/18 0649 02/25/18 0256 02/27/18 0243  WBC 6.1 5.1 5.2 4.5 6.4  NEUTROABS 4.2 3.2  --   --  3.8  HGB 9.4* 11.1* 10.5* 10.2* 9.4*  HCT 30.0* 36.2* 34.7* 33.3* 30.2*  MCV 109.9* 110.7* 111.9* 111.7* 109.0*  PLT 11* 12* 12* 11* 12*   Basic Metabolic Panel: Recent Labs  Lab 02/24/18 0649 02/25/18 0256 02/25/18 0932 02/26/18 0243 02/27/18 0243 02/28/18 0605  NA 141 143  --  140 139 139  K 3.5 3.3*  --  3.6 3.0* 3.6  CL 116* 117*  --  115* 112* 110  CO2 19* 22  --  21* 20* 23  GLUCOSE 121* 134*  --  96 96 87  BUN 20 18  --  17 14 11   CREATININE 0.90 0.76  --  0.88 0.78 0.96  CALCIUM 7.9* 8.0*  --  7.9* 7.7* 7.5*  MG 1.7  --  1.5* 1.3* 1.7  --   PHOS 2.4*  --   --   --   --   --  GFR: Estimated Creatinine Clearance: 93.5 mL/min (by C-G formula based on SCr of 0.96 mg/dL). Liver Function Tests: Recent Labs  Lab 02/22/18 1057 02/25/18 0256 02/27/18 0243  AST 170* 70* 62*  ALT 50* 38 38  ALKPHOS 47 51 56  BILITOT 1.7* 1.6* 1.4*  PROT 6.5 6.1* 5.8*  ALBUMIN 2.1* 1.9* 1.8*   No results for input(s): LIPASE, AMYLASE in the last 168 hours. No results for input(s): AMMONIA in the last 168 hours. Coagulation Profile: No results for input(s): INR, PROTIME in the last 168 hours. Cardiac Enzymes: No results for input(s): CKTOTAL, CKMB, CKMBINDEX, TROPONINI  in the last 168 hours. BNP (last 3 results) No results for input(s): PROBNP in the last 8760 hours. HbA1C: No results for input(s): HGBA1C in the last 72 hours. CBG: No results for input(s): GLUCAP in the last 168 hours. Lipid Profile: No results for input(s): CHOL, HDL, LDLCALC, TRIG, CHOLHDL, LDLDIRECT in the last 72 hours. Thyroid Function Tests: No results for input(s): TSH, T4TOTAL, FREET4, T3FREE, THYROIDAB in the last 72 hours. Anemia Panel: No results for input(s): VITAMINB12, FOLATE, FERRITIN, TIBC, IRON, RETICCTPCT in the last 72 hours. Sepsis Labs: Recent Labs  Lab 02/22/18 1104 02/22/18 1303  LATICACIDVEN 1.8 2.0*    Recent Results (from the past 240 hour(s))  Culture, blood (routine x 2)     Status: Abnormal   Collection Time: 02/20/18 12:54 PM  Result Value Ref Range Status   Specimen Description   Final    RIGHT ANTECUBITAL Performed at [facility], [address] [place], [place] [address]    Special Requests   Final    BOTTLES DRAWN AEROBIC AND ANAEROBIC Blood Culture adequate volume Performed at [facility], [address] [place], [place] [address]    Culture  Setup Time   Final    GRAM NEGATIVE RODS IN BOTH AEROBIC AND ANAEROBIC BOTTLES CRITICAL RESULT CALLED TO, READ BACK BY AND VERIFIED WITH: B GREEN PHARMD 0253 02/21/2018 A BROWNING    Culture (A)  Final    ESCHERICHIA COLI SUSCEPTIBILITIES PERFORMED ON PREVIOUS CULTURE WITHIN THE LAST 5 DAYS. Performed at [facility], [address], [place], [place] [address]    Report Status 02/23/2018 FINAL  Final  Culture, blood (routine x 2)     Status: Abnormal   Collection Time: 02/20/18 12:56 PM  Result Value Ref Range Status   Specimen Description   Final    LEFT ANTECUBITAL Performed at [facility], [address] [place], [place] [address]    Special Requests   Final    BOTTLES DRAWN AEROBIC AND ANAEROBIC Blood Culture adequate  volume Performed at [facility], [address], [place], [place] [address]    Culture  Setup Time   Final    GRAM NEGATIVE RODS IN BOTH AEROBIC AND ANAEROBIC BOTTLES CRITICAL RESULT CALLED TO, READ BACK BY AND VERIFIED WITH: B GREEN PHARMD [id number] 02/21/2018 A BROWNING Performed at [facility], [address] [place], [place] [address]    Culture ESCHERICHIA COLI (A)  Final   Report Status 02/23/2018 FINAL  Final   Organism ID, Bacteria ESCHERICHIA COLI  Final      Susceptibility   Escherichia coli - MIC*    AMPICILLIN >=32 RESISTANT Resistant     CEFAZOLIN 16 SENSITIVE Sensitive     CEFEPIME <=1 SENSITIVE Sensitive     CEFTAZIDIME <=1 SENSITIVE Sensitive     CEFTRIAXONE <=1 SENSITIVE Sensitive  CIPROFLOXACIN <=0.25 SENSITIVE Sensitive     GENTAMICIN <=1 SENSITIVE Sensitive     IMIPENEM <=0.25 SENSITIVE Sensitive     TRIMETH/SULFA <=20 SENSITIVE Sensitive     AMPICILLIN/SULBACTAM >=32 RESISTANT Resistant     PIP/TAZO <=4 SENSITIVE Sensitive     Extended ESBL NEGATIVE Sensitive     * ESCHERICHIA COLI  Blood Culture ID Panel (Reflexed)     Status: Abnormal   Collection Time: 02/20/18 12:56 PM  Result Value Ref Range Status   Enterococcus species NOT DETECTED NOT DETECTED Final   Listeria monocytogenes NOT DETECTED NOT DETECTED Final   Staphylococcus species NOT DETECTED NOT DETECTED Final   Staphylococcus aureus (BCID) NOT DETECTED NOT DETECTED Final   Streptococcus species NOT DETECTED NOT DETECTED Final   Streptococcus agalactiae NOT DETECTED NOT DETECTED Final   Streptococcus pneumoniae NOT DETECTED NOT DETECTED Final   Streptococcus pyogenes NOT DETECTED NOT DETECTED Final   Acinetobacter baumannii NOT DETECTED NOT DETECTED Final   Enterobacteriaceae species DETECTED (A) NOT DETECTED Final    Comment: Enterobacteriaceae represent a large family of gram-negative bacteria, not a single organism. CRITICAL RESULT CALLED TO, READ BACK BY AND VERIFIED  WITH: B GREEN PHARMD 0253 02/21/2018 A BROWNING    Enterobacter cloacae complex NOT DETECTED NOT DETECTED Final   Escherichia coli DETECTED (A) NOT DETECTED Final    Comment: CRITICAL RESULT CALLED TO, READ BACK BY AND VERIFIED WITH: B GREEN PHARMD 0253 02/21/2018 A BROWNING    Klebsiella oxytoca NOT DETECTED NOT DETECTED Final   Klebsiella pneumoniae NOT DETECTED NOT DETECTED Final   Proteus species NOT DETECTED NOT DETECTED Final   Serratia marcescens NOT DETECTED NOT DETECTED Final   Carbapenem resistance NOT DETECTED NOT DETECTED Final   Haemophilus influenzae NOT DETECTED NOT DETECTED Final   Neisseria meningitidis NOT DETECTED NOT DETECTED Final   Pseudomonas aeruginosa NOT DETECTED NOT DETECTED Final   Candida albicans NOT DETECTED NOT DETECTED Final   Candida glabrata NOT DETECTED NOT DETECTED Final   Candida krusei NOT DETECTED NOT DETECTED Final   Candida parapsilosis NOT DETECTED NOT DETECTED Final   Candida tropicalis NOT DETECTED NOT DETECTED Final    Comment: Performed at [facility], [address] [place], [place] [address]  Blood Culture (routine x 2)     Status: Abnormal   Collection Time: 02/20/18  1:59 PM  Result Value Ref Range Status   Specimen Description   Final    BLOOD LEFT ANTECUBITAL Performed at [facility], [address] [place], [place] [address]    Special Requests   Final    BOTTLES DRAWN AEROBIC AND ANAEROBIC Blood Culture results may not be optimal due to an excessive volume of blood received in culture bottles Performed at [facility], [address] [place], [place] [address]    Culture  Setup Time   Final    GRAM NEGATIVE RODS IN BOTH AEROBIC AND ANAEROBIC BOTTLES CRITICAL RESULT CALLED TO, READ BACK BY AND VERIFIED WITH: B GREEN PHARMD 0253 02/21/2018 A BROWNING    Culture (A)  Final    ESCHERICHIA COLI SUSCEPTIBILITIES PERFORMED ON PREVIOUS CULTURE WITHIN THE LAST 5 DAYS. Performed at  [facility], [address] [place], [place] [address]    Report Status 02/23/2018 FINAL  Final  Blood Culture (routine x 2)     Status: Abnormal   Collection Time: 02/20/18  2:04 PM  Result Value Ref Range Status   Specimen Description  Final    BLOOD RIGHT HAND Performed at [facility], [address] [place] [place], [place] [address]    Special Requests   Final    BOTTLES DRAWN AEROBIC AND ANAEROBIC Blood Culture adequate volume Performed at [facility], [address] [place], [place] [address]    Culture  Setup Time   Final    GRAM NEGATIVE RODS IN BOTH AEROBIC AND ANAEROBIC BOTTLES CRITICAL RESULT CALLED TO, READ BACK BY AND VERIFIED WITH: B GREEN PHARMD 0253 02/21/2018 A BROWNING    Culture (A)  Final    ESCHERICHIA COLI SUSCEPTIBILITIES PERFORMED ON PREVIOUS CULTURE WITHIN THE LAST 5 DAYS. Performed at [facility], [address] [place], [place], [place] [address]    Report Status 02/23/2018 FINAL  Final  MRSA PCR Screening     Status: None   Collection Time: 02/20/18  6:06 PM  Result Value Ref Range Status   MRSA by PCR NEGATIVE NEGATIVE Final    Comment:        The GeneXpert MRSA Assay (FDA approved for NASAL specimens only), is one component of a comprehensive MRSA colonization surveillance program. It is not intended to diagnose MRSA infection nor to guide or monitor treatment for MRSA infections. Performed at [facility], [address], [place], [place] [address]   Culture, blood (Routine X 2) w Reflex to ID Panel     Status: None (Preliminary result)   Collection Time: 02/24/18 11:30 AM  Result Value Ref Range Status   Specimen Description   Final    BLOOD LEFT ARM Performed at [facility], [address] [place], [place] [address]    Special Requests   Final    BOTTLES DRAWN AEROBIC AND ANAEROBIC Blood Culture adequate volume Performed at [facility], [address] [place], [place] [address]    Culture   Final    NO GROWTH 4 DAYS Performed at [facility], [address] [place], [place] [address]    Report Status PENDING  Incomplete  Culture, blood (Routine X 2) w Reflex to ID Panel     Status: None (Preliminary result)   Collection Time: 02/24/18 11:36 AM  Result Value Ref Range Status   Specimen Description   Final    BLOOD LEFT ARM Performed at [facility], [address], [place], [place] [address]    Special Requests   Final    BOTTLES DRAWN AEROBIC AND ANAEROBIC Blood Culture adequate volume Performed at [facility], [address], [place], [place] [address]    Culture   Final    NO GROWTH 4 DAYS Performed at [facility], [address], [place], [place] [address]    Report Status PENDING  Incomplete         Radiology Studies: Dg Swallowing Func-speech Pathology  Result Date: 02/27/2018 Objective Swallowing Evaluation: Type of Study: MBS-Modified Barium Swallow Study  Patient Details Name: Albert Salazar MRN: [id number] Date of Birth: 10-31-1955 Today's Date: 02/27/2018 Time: SLP Start Time (ACUTE ONLY): 0810 -SLP Stop Time (ACUTE ONLY): 0835 SLP Time Calculation (min) (ACUTE ONLY): 25 min Past Medical History: Past Medical History: Diagnosis Date . Bipolar disorder (HCC)  . Blindness of left eye  . Cirrhosis of liver (HCC)  . Depression  . Hepatitis C  . Polysubstance abuse [facility])  Past Surgical History: Past Surgical History: Procedure Laterality Date . EYE SURGERY   HPI: 63  y.o. male admitted with alcohol withdrawal/DTs, sepsis, inguinal hydrocele, hx of cirrhosis and bipolar disorder.  Per nurse, noted to be coughing while eating, and CT showed new airspace opacities primarily in the right upper lobe. Therefore swallow evaluation was re-ordered.  Subjective: pt awake in bed Assessment / Plan / Recommendation CHL IP CLINICAL IMPRESSIONS 02/27/2018 Clinical  Impression Patient presents with mild oropharyngeal dysphagia without aspiration.  Decreased oral coordination resulted in premature spillage of oral residuals.  Trace laryngeal penetration of thin observed intermittently with thin.  Mild delay in mastication of solids observed.  Pt did not transit barium tablet (adhered to mid tongue) despite trials of thin and then pudding to help clear.  He expectorated per cue.  Intermittent minimal pharyngeal residuals of liquids more than solids noted that mixed with secretions.   Cued dry swallow helpful to clear residuals.  Pt did cough during mbs when no barium visualized in larynx/trachea.  In addition he was belching thus recommend reflux precautions.  Due to oral deficits being primary and poor dentition recommend dys3/thin diet with full supervision and strict precautions.  SlP will follow up for tolerance and indication for swallow treatment.  Pt educated to findings/recommendations using teach back and flouro loops.   SLP Visit Diagnosis Dysphagia, oropharyngeal phase (R13.12) Attention and concentration deficit following -- Frontal lobe and executive function deficit following -- Impact on safety and function Mild aspiration risk   CHL IP TREATMENT RECOMMENDATION 02/27/2018 Treatment Recommendations Therapy as outlined in treatment plan below   Prognosis 02/27/2018 Prognosis for Safe Diet Advancement Good Barriers to Reach Goals -- Barriers/Prognosis Comment -- CHL IP DIET RECOMMENDATION 02/27/2018 SLP Diet Recommendations Dysphagia 3 (Mech soft) solids;Thin liquid Liquid Administration via Cup;Straw Medication Administration Crushed with puree Compensations Slow rate;Small sips/bites Postural Changes Remain semi-upright after after feeds/meals (Comment);Seated upright at 90 degrees   CHL IP OTHER RECOMMENDATIONS 02/27/2018 Recommended Consults -- Oral Care Recommendations Oral care BID Other Recommendations --   CHL IP FOLLOW UP RECOMMENDATIONS 02/27/2018 Follow up  Recommendations (No Data)   CHL IP FREQUENCY AND DURATION 02/27/2018 Speech Therapy Frequency (ACUTE ONLY) min 2x/week Treatment Duration 1 week      CHL IP ORAL PHASE 02/27/2018 Oral Phase Impaired Oral - Pudding Teaspoon -- Oral - Pudding Cup -- Oral - Honey Teaspoon -- Oral - Honey Cup -- Oral - Nectar Teaspoon Decreased bolus cohesion;Weak lingual manipulation Oral - Nectar Cup -- Oral - Nectar Straw Decreased bolus cohesion;Weak lingual manipulation Oral - Thin Teaspoon Decreased bolus cohesion;Weak lingual manipulation Oral - Thin Cup Decreased bolus cohesion;Weak lingual manipulation Oral - Thin Straw Decreased bolus cohesion;Weak lingual manipulation Oral - Puree Decreased bolus cohesion;Weak lingual manipulation Oral - Mech Soft Decreased bolus cohesion;Weak lingual manipulation Oral - Regular -- Oral - Multi-Consistency -- Oral - Pill Decreased bolus cohesion;Weak lingual manipulation Oral Phase - Comment --  CHL IP PHARYNGEAL PHASE 02/27/2018 Pharyngeal Phase Impaired Pharyngeal- Pudding Teaspoon -- Pharyngeal -- Pharyngeal- Pudding Cup -- Pharyngeal -- Pharyngeal- Honey Teaspoon -- Pharyngeal -- Pharyngeal- Honey Cup -- Pharyngeal -- Pharyngeal- Nectar Teaspoon -- Pharyngeal -- Pharyngeal- Nectar Cup Pharyngeal residue - valleculae;Delayed swallow initiation-vallecula;Pharyngeal residue - pyriform Pharyngeal -- Pharyngeal- Nectar Straw -- Pharyngeal -- Pharyngeal- Thin Teaspoon -- Pharyngeal -- Pharyngeal- Thin Cup Pharyngeal residue - valleculae;Delayed swallow initiation-pyriform sinuses;Pharyngeal residue - pyriform;Penetration/Aspiration during swallow Pharyngeal Material enters airway, remains ABOVE vocal cords and not ejected out Pharyngeal- Thin Straw Delayed swallow initiation-pyriform sinuses;Reduced airway/laryngeal closure;Reduced tongue base retraction;Penetration/Aspiration during swallow Pharyngeal Material enters airway, remains ABOVE  vocal cords and not ejected out Pharyngeal- Puree Delayed  swallow initiation-vallecula Pharyngeal Material does not enter airway Pharyngeal- Mechanical Soft -- Pharyngeal -- Pharyngeal- Regular Delayed swallow initiation-vallecula Pharyngeal -- Pharyngeal- Multi-consistency -- Pharyngeal -- Pharyngeal- Pill NT Pharyngeal -- Pharyngeal Comment --  CHL IP CERVICAL ESOPHAGEAL PHASE 02/27/2018 Cervical Esophageal Phase WFL Pudding Teaspoon -- Pudding Cup -- Honey Teaspoon -- Honey Cup -- Nectar Teaspoon -- Nectar Cup -- Nectar Straw -- Thin Teaspoon -- Thin Cup -- Thin Straw -- Puree -- Mechanical Soft -- Regular -- Multi-consistency -- Pill -- Cervical Esophageal Comment -- [name] 02/27/2018, 11:49 AM  [name], MS [facility] SLP Acute Rehab Services Pager [phone] Office [phone]                  Scheduled Meds: . cloNIDine  0.1 mg Oral TID  . mouth rinse  15 mL Mouth Rinse BID  . miconazole   Topical BID  . tamsulosin  0.4 mg Oral Daily  . thiamine  100 mg Oral Daily   Or  . thiamine  100 mg Intravenous Daily   Continuous Infusions: . piperacillin-tazobactam (ZOSYN)  IV 3.375 g (02/28/18 1128)  .  sodium bicarbonate (isotonic) infusion in sterile water 75 mL/hr at 02/28/18 0341     LOS: 8 days    Time spent: 29 mins    Alvira Philips [place], DO [facility] Pager [phone]  If 7PM-7AM, please contact night-coverage [email or web address] Password [email or web address] 02/28/2018, 3:11 PM

## 2018-02-28 NOTE — Progress Notes (Signed)
Paged by bedside RN that patients MEWS was now Red and that he was also in respiratory distress. Rapid response was also paged and present at bedside as well. Upon entering the room the pt was tachypneic with some accessory muscle use. Oxygen use had increased from 2L to 4L via [name].  LS: Rhonchus bilaterally with slight crackles in the bases. He is Alert but confused, MOEx4, and no focal deficits noted. NT suction performed with a small amount of thick secretions obtained. Pt still having some increased secretions and trouble clearing them with cough. Has Robinul 0.1mg  IV 2 times daily which will start tonight. Added an additional 0.1mg  of Robinul IV to the initial dose for a total of 0.2 mg.  PRN Duoneb given as well.   CXR revealed "Stigmata of mild CHF with stable cardiomegaly, vascular congestionand left greater than right layering small pleural effusions." Ordered 40mg  of Lasix IV x1. Foley was placed for acute urinary retention as well. ABG was normal. Pt stated at the time that he was beginning to feel better. Upon exiting the room his tachypnea and accessory muscle use had subsided. He was resting comfortably on 4L via [name].  Albert Salazar AGPCNP-BC, AGNP-C [facility] Pager [phone]

## 2018-03-01 ENCOUNTER — Inpatient Hospital Stay (HOSPITAL_COMMUNITY): Payer: Medicaid Other

## 2018-03-01 DIAGNOSIS — Z7189 Other specified counseling: Secondary | ICD-10-CM

## 2018-03-01 DIAGNOSIS — J690 Pneumonitis due to inhalation of food and vomit: Secondary | ICD-10-CM

## 2018-03-01 DIAGNOSIS — Z515 Encounter for palliative care: Secondary | ICD-10-CM

## 2018-03-01 DIAGNOSIS — K7031 Alcoholic cirrhosis of liver with ascites: Secondary | ICD-10-CM

## 2018-03-01 LAB — COMPREHENSIVE METABOLIC PANEL
ALK PHOS: 52 U/L (ref 38–126)
ALT: 32 U/L (ref 0–44)
AST: 63 U/L — ABNORMAL HIGH (ref 15–41)
Albumin: 1.7 g/dL — ABNORMAL LOW (ref 3.5–5.0)
Anion gap: 6 (ref 5–15)
BUN: 13 mg/dL (ref 6–20)
CO2: 28 mmol/L (ref 22–32)
Calcium: 7.8 mg/dL — ABNORMAL LOW (ref 8.9–10.3)
Chloride: 105 mmol/L (ref 98–111)
Creatinine, Ser: 0.92 mg/dL (ref 0.61–1.24)
GFR calc Af Amer: >60 mL/min (ref >60)
Glucose, Bld: 89 mg/dL (ref 70–99)
Potassium: 4.1 mmol/L (ref 3.5–5.1)
Sodium: 139 mmol/L (ref 135–145)
Total Bilirubin: 1.7 mg/dL — ABNORMAL HIGH (ref 0.3–1.2)
Total Protein: 5.9 g/dL — ABNORMAL LOW (ref 6.5–8.1)

## 2018-03-01 LAB — CBC
HCT: 30.5 % — ABNORMAL LOW (ref 39.0–52.0)
Hemoglobin: 9.2 g/dL — ABNORMAL LOW (ref 13.0–17.0)
MCH: 33.5 pg (ref 26.0–34.0)
MCHC: 30.2 g/dL (ref 30.0–36.0)
MCV: 110.9 fL — AB (ref 80.0–100.0)
Platelets: 13 10*3/uL — CL (ref 150–400)
RBC: 2.75 MIL/uL — ABNORMAL LOW (ref 4.22–5.81)
RDW: 15.6 % — ABNORMAL HIGH (ref 11.5–15.5)
WBC: 7.3 10*3/uL (ref 4.0–10.5)
nRBC: 0 % (ref 0.0–0.2)

## 2018-03-01 LAB — CULTURE, BLOOD (ROUTINE X 2)
Culture: NO GROWTH
Culture: NO GROWTH
Special Requests: ADEQUATE
Special Requests: ADEQUATE

## 2018-03-01 MED ORDER — PHENOL 1.4 % MT LIQD
1.0000 | OROMUCOSAL | Status: DC | PRN
  Administered 2018-03-01: 1 via OROMUCOSAL
  Filled 2018-03-01: qty 177

## 2018-03-01 MED ORDER — DEXTROSE-NACL 5-0.9 % IV SOLN
INTRAVENOUS | Status: DC
  Administered 2018-03-01 – 2018-03-03 (×3): via INTRAVENOUS
  Filled 2018-03-01: qty 1000

## 2018-03-01 NOTE — Progress Notes (Signed)
03/01/2018  0920  Spoke with Shon Hale from speech. She states she will be over this afternoon to see patient.

## 2018-03-01 NOTE — Plan of Care (Signed)
  Problem: Safety: Goal: Ability to remain free from injury will improve Outcome: Progressing   Problem: Elimination: Goal: Will not experience complications related to bowel motility Outcome: Progressing   Problem: Pain Managment: Goal: General experience of comfort will improve Outcome: Progressing   

## 2018-03-01 NOTE — Progress Notes (Signed)
  Speech Language Pathology Treatment: Dysphagia  Patient Details Name: Albert Salazar MRN: [id number] DOB: 1955-07-03 Today's Date: 03/01/2018 Time: 0940-7680 SLP Time Calculation (min) (ACUTE ONLY): 26 min  Assessment / Plan / Recommendation Clinical Impression  Pt seen for follow-up for dysphagia to reassess ability to take POs. Pt alert and cooperative, following commands and requesting something to eat/drink. SLP performed oral care with suction, removing pinkish-tinged secretions. His vocal quality is mildly wet at baseline; clears with verbal cue for throat clear. Pt consumed thin liquids (water, ginger ale), with cough x1 noted after larger/consecutive swallows. SLP provided min-mod verbal and tactile cues for slower rate, smaller sips, fading support to occasional min verbal cues. No further overt signs of aspiration noted. Puree and solid cracker (softened in applesauce) consumed with appearance of timely oral transit, with verbal cue x1 for swallow initiation when pt distracted. Recommend he resume dys 3/thin liquids with full supervision. D/w RN that pt's swallow function appears to fluctuate with mentation; hold POs if pt coughing/choking with intake or when he is lethargic. SLP will follow up for tolerance.   HPI HPI: 63 y.o. male admitted with alcohol withdrawal/DTs, sepsis, inguinal hydrocele, hx of cirrhosis and bipolar disorder.  MBS on 02/27/18 showed oropharyngeal phase dysphagia. Per nurse, noted to be coughing while eating with an aspiration event last night.  Nurse also reported pt was coughing after drinking water this morning, but took pills whole with applesauce.       SLP Plan  Continue with current plan of care       Recommendations  Diet recommendations: Dysphagia 3 (mechanical soft);Thin liquid Liquids provided via: Cup Medication Administration: Crushed with puree Supervision: Full supervision/cueing for compensatory strategies Compensations: Small sips/bites;Slow  rate;Minimize environmental distractions Postural Changes and/or Swallow Maneuvers: Seated upright 90 degrees;Upright 30-60 min after meal                Oral Care Recommendations: Oral care QID Follow up Recommendations: Other (comment)(tba) SLP Visit Diagnosis: Dysphagia, oropharyngeal phase (R13.12) Plan: Continue with current plan of care       GO               Rondel Baton, MS, CCC-SLP Speech-Language Pathologist [facility] Pager: [phone] Office: [phone]  Arlana Lindau 03/01/2018, 6:41 PM

## 2018-03-01 NOTE — Progress Notes (Signed)
CRITICAL VALUE ALERT  Critical Value:  PLT 13  Date & Time Notied:  03/01/2018  0845  Provider Notified: Dr Mahala Menghini  Orders Received/Actions taken: Waiting for orders

## 2018-03-01 NOTE — Progress Notes (Signed)
[facility] PROGRESS NOTE  Albert Salazar UTM:[id number] DOB: 07-30-1955 DOA: 02/20/2018 PCP: Gilda Crease, MD   Narrative: 63 year old male Known history of EtOH, cirrhosis secondary to the same, hep CWithout specific treatment, polysubstance abuse, prior admission to hospital 05/2014 Admit with cirrhosis ascites and possible epididymitis at that time  Admitted 1/23 with questionable sepsis started on empiric antibiotics and fluid hydration-CT scan showed BPH right inguinal fluid collection?  Hydrocele -On critical care service until 1/30 when he was tapered off of his Precedex placed on Librium clonidine tapers  A & Plan Alcohol withdrawal and DTs Much more calm seems a little bit more coherent but not sure if he is still safe to eat-nursing reports that he was coughing yesterday while eating CIWA score is 1-3 range but has been as high as 17 only a day ago so we will keep him on the protocol and monitor Because he is impulsive he will need continued safety sitter E. coli bacteremia We will continue at this time Zosyn it appears he was on IV fluids-see below Aspiration pneumonia 1/28 Patient with significant respiratory distress 1/30 1 PM oxygen level raised confused thick secretions noted-x-ray suggestive of cardiomegaly fluid overload also had retention Await speech therapy input prior to discussion about feeds Suspect he is a poor candidate for tube feeds or PEG and palliative has been consulted regarding goals of care discussions Dysphagia-see above Inguinal hydrocele Stable at this time [place] This has resolved with fluid repletion his bicarb is 28 Cirrhosis and hep C Thrombocytopenia This is now more profound with platelets in the 12-13 range-he is at very high risk for spontaneous bleeding will be monitored Prior cocaine abuse  As above Urinary retention  Keep Foley Right upper extremity swelling negative for DVT Anemia   Presumed full code unable to  determine No family present Inpatient Await palliative discussions  Hymie Gorr, MD  [facility] Via amion app OR [email or web address] 7PM-7AM contact night coverage as above 03/01/2018, 7:53 AM  LOS: 9 days   Consultants:  Palliative care  Procedures:  None  Antimicrobials:  Zosyn  Interval history/Subjective: Awake alert but not completely coherent verbalizes but does not seem to know where he is keeps repeating the year for the month when I asked him although is able to talk otherwise Is able to tell me he is hungry Nursing reports events from overnight in addition to shortness of breath   Objective:  Vitals:  Vitals:   03/01/18 0400 03/01/18 0615  BP:    Pulse:  85  Resp: (!) 22 (!) 23  Temp:  97.9 F (36.6 C)  SpO2: 98% 98%    Exam:  Awake somewhat alert but confused at times left eye has iridescent JVD not distended S1-S2 no murmur Abdomen soft no rebound Mucosas dry Neurologically able to move all 4 limbs Psych is euthymic   I have personally reviewed the following:  DATA   Labs:  BUN/creatinine 13/0.9  Albumin 1.7  Protein 5.9  Bilirubin 1.7  Hemoglobin 9.2 platelet 13 MCV 110  Imaging studies:  CXR shows clearing of right upper lobe airspace disease some vascular congestion and layering small pleural effusion  Medical tests:     Test discussed with performing physician:    Decision to obtain old records:    Review and summation of old records:    Scheduled Meds: . cloNIDine  0.1 mg Oral TID  . glycopyrrolate  0.1 mg Intravenous BID  . mouth rinse  15 mL Mouth Rinse BID  .  miconazole   Topical BID  . tamsulosin  0.4 mg Oral Daily  . thiamine  100 mg Oral Daily   Or  . thiamine  100 mg Intravenous Daily   Continuous Infusions: . piperacillin-tazobactam (ZOSYN)  IV 3.375 g (03/01/18 0416)  .  sodium bicarbonate (isotonic) infusion in sterile water 75 mL/hr at 02/28/18 0341    Principal Problem:   Alcohol  withdrawal (HCC) Active Problems:   Alcohol use disorder, severe, dependence (HCC)   Cocaine use disorder, mild, abuse (HCC)   Chronic hepatitis C without hepatic coma (HCC)   Thrombocytopenia (HCC)   Alcohol intoxication (HCC)   Alcoholic cirrhosis of liver with ascites (HCC)   Aspiration pneumonia (HCC)   Bacteremia due to Gram-negative bacteria   Tachypnea   LOS: 9 days

## 2018-03-01 NOTE — Consult Note (Signed)
                                                                                 Consultation Note Date: 03/02/2018   Patient Name: Albert Salazar  DOB: 06/03/1955  MRN: [id number]  Age / Sex: 63 y.o., male  PCP: Pavelock, Richard M, MD Referring Physician: Samtani, Jai-Gurmukh, MD  Reason for Consultation: Establishing goals of care  HPI/Patient Profile: 63 y.o. male  with past medical history of ETOH, cirrhosis, hep C, polysubstance abuse, ascites, possible epididymitis, thrombocytopenia admitted on 02/20/2018 with E coli bacteremia, alcohol withdrawal, and dysphagia.  Continued concern about him aspirating moving forward as well as significant thrombocytopenia (chronic from alcohol, bu acutely worse this hospitalization.   Clinical Assessment and Goals of Care: I met today with [name].  He is awake and alert, but confused (reports needing to go to work tomorrow).  States that he wants to have something to eat and grapefruit juice.  Discussed with bedside care team, and he did have nephew in today.  His nephew took HCPOA paperwork to review with family to see if anyone will be willing to serve as his surrogate as he cannot make his own decisions currently.  SUMMARY OF RECOMMENDATIONS   - Mr. Mormino lacked capacity to make his own medical decisions at the time of my encounter.  This can certainly fluctuate, but I think that he is going to need to have a surrogate act on his behalf.  Family reported to be discussing this.   - F/u with nephew tomorrow to see if family have made decision on surrogate.  Code Status/Advance Care Planning:  Full code   Symptom Management:  Standard delirium management (adapted from NICE guidelines 2011 for prevention of delirium):  Provide continuity of care when possible (avoid frequent changing of surroundings and staff).  Frequent reorientation to time with:  A clock should always be visible.  Make sure Calendar/white board is updated.  Lights  on/blinds open during the day and off/closed at night.  Encourage frequent family visits.  Monitor for and treat dehydration/constipation.  Optimize oxygen saturation.  Avoid catheters and IV's when possible and look for/treat infections.  Encourage early mobility.  Assess and treat pain.  Ensure adequate nutrition and functioning dentures.  Address reversible causes of hearing and visual impairment:  Use pocket talker if hearing aids are unavailable.  Avoid sleep disturbance (normalize sleep/wake cycle).  Minimize disturbances and consider NOT obtaining vitals at night if possible.  Review Medications to avoid polypharmacy and avoid deliriogenic medications when possible:  Benzodiazepines.  Dihydropyridines.  Antihistamines.  Anticholinergics.  (Possibly avoid: H2 blockers, tricyclic antidepressants, antiparkinson medications, steroids, NSAID's).   Palliative Prophylaxis:   Aspiration and Delirium Protocol  Additional Recommendations (Limitations, Scope, Preferences):  Full Scope Treatment  Psycho-social/Spiritual:   Desire for further Chaplaincy support:Did not address today  Additional Recommendations: Caregiving  Support/Resources  Prognosis:   Guarded  Discharge Planning: To Be Determined      Primary Diagnoses: Present on Admission: . Alcohol withdrawal (HCC) . Alcohol intoxication (HCC) . Alcohol use disorder, severe, dependence (HCC) . Alcoholic cirrhosis of liver with ascites (HCC) . Chronic hepatitis C without hepatic coma (  [place]) . Cocaine use disorder, mild, abuse ([place]) . Bacteremia due to Gram-negative bacteria . Thrombocytopenia ([place])   I have reviewed the medical record, interviewed the patient and family, and examined the patient. The following aspects are pertinent.  Past Medical History:  Diagnosis Date  . Bipolar disorder ([place])   . Blindness of left eye   . Cirrhosis of liver ([place])   . Depression   . Hepatitis C   . Polysubstance abuse  ([place])    Social History   Socioeconomic History  . Marital status: Married    Spouse name: Not on file  . Number of children: Not on file  . Years of education: Not on file  . Highest education level: Not on file  Occupational History  . Not on file  Social Needs  . Financial resource strain: Not on file  . Food insecurity:    Worry: Not on file    Inability: Not on file  . Transportation needs:    Medical: Not on file    Non-medical: Not on file  Tobacco Use  . Smoking status: Current Every Day Smoker    Packs/day: 1.00    Years: 40.00    Pack years: 40.00    Types: Cigarettes  . Smokeless tobacco: Never Used  Substance and Sexual Activity  . Alcohol use: Yes    Alcohol/week: 8.0 standard drinks    Types: 8 Cans of beer per week  . Drug use: Yes    Types: IV, Cocaine  . Sexual activity: Not on file  Lifestyle  . Physical activity:    Days per week: Not on file    Minutes per session: Not on file  . Stress: Not on file  Relationships  . Social connections:    Talks on phone: Not on file    Gets together: Not on file    Attends religious service: Not on file    Active member of club or organization: Not on file    Attends meetings of clubs or organizations: Not on file    Relationship status: Not on file  Other Topics Concern  . Not on file  Social History Narrative  . Not on file   Family History  Problem Relation Age of Onset  . Diabetes Brother    Scheduled Meds: . cloNIDine  0.1 mg Oral TID  . glycopyrrolate  0.1 mg Intravenous BID  . mouth rinse  15 mL Mouth Rinse BID  . miconazole   Topical BID  . tamsulosin  0.4 mg Oral Daily  . thiamine  100 mg Oral Daily   Or  . thiamine  100 mg Intravenous Daily   Continuous Infusions: . dextrose 5 % and 0.9% NaCl 50 mL/hr at 03/02/18 0415  . piperacillin-tazobactam (ZOSYN)  IV 3.375 g (03/01/18 1752)   PRN Meds:.acetaminophen, bisacodyl, hydrALAZINE, ipratropium-albuterol, phenol Medications Prior to  Admission:  Prior to Admission medications   Not on File   No Known Allergies Review of Systems Reports being thirsty. Denies all other complaints.  Unreliable historian  Physical Exam  General: Alert, awake, in no acute distress but confused.  Speech difficult to understand at times  HEENT: No bruits, no goiter, no JVD Heart: Regular rate and rhythm. No murmur appreciated. Lungs: Fair air movement, clear Abdomen: Soft, nontender, nondistended, positive bowel sounds.  Skin: Warm and dry Neuro: Moves 4 extremities  Vital Signs: BP 136/63 (BP Location: Left Arm)   Pulse 74   Temp 98.8 F (37.1 C) (Oral)  Resp 20   Ht 5' 9" (1.753 m)   Wt 95.9 kg   SpO2 97%   BMI 31.22 kg/m  Pain Scale: 0-10 POSS *See Group Information*: S-Acceptable,Sleep, easy to arouse Pain Score: Asleep   SpO2: SpO2: 97 % O2 Device:SpO2: 97 % O2 Flow Rate: .O2 Flow Rate (L/min): 2 L/min  IO: Intake/output summary:   Intake/Output Summary (Last 24 hours) at 03/02/2018 0823 Last data filed at 03/02/2018 0633 Gross per 24 hour  Intake 1192.25 ml  Output 1750 ml  Net -557.75 ml    LBM: Last BM Date: 02/28/18 Baseline Weight: Weight: 74.8 kg Most recent weight: Weight: 95.9 kg     Palliative Assessment/Data:     Time: 1700-1750  Time Total: 50 Greater than 50%  of this time was spent counseling and coordinating care related to the above assessment and plan.  Signed by:  , MD   Please contact Palliative Medicine Team phone at [phone] for questions and concerns.  For individual provider: See Amion              

## 2018-03-02 ENCOUNTER — Inpatient Hospital Stay (HOSPITAL_COMMUNITY): Payer: Medicaid Other

## 2018-03-02 LAB — CBC WITH DIFFERENTIAL/PLATELET
Abs Immature Granulocytes: 0.02 10*3/uL (ref 0.00–0.07)
Basophils Absolute: 0 10*3/uL (ref 0.0–0.1)
Basophils Relative: 0 %
Eosinophils Absolute: 0 10*3/uL (ref 0.0–0.5)
Eosinophils Relative: 0 %
HCT: 30.2 % — ABNORMAL LOW (ref 39.0–52.0)
Hemoglobin: 9.3 g/dL — ABNORMAL LOW (ref 13.0–17.0)
Immature Granulocytes: 0 %
LYMPHS PCT: 26 %
Lymphs Abs: 2 10*3/uL (ref 0.7–4.0)
MCH: 33.6 pg (ref 26.0–34.0)
MCHC: 30.8 g/dL (ref 30.0–36.0)
MCV: 109 fL — AB (ref 80.0–100.0)
Monocytes Absolute: 0.5 10*3/uL (ref 0.1–1.0)
Monocytes Relative: 7 %
Neutro Abs: 5 10*3/uL (ref 1.7–7.7)
Neutrophils Relative %: 67 %
Platelets: 13 10*3/uL — CL (ref 150–400)
RBC: 2.77 MIL/uL — ABNORMAL LOW (ref 4.22–5.81)
RDW: 14.6 % (ref 11.5–15.5)
WBC: 7.6 10*3/uL (ref 4.0–10.5)
nRBC: 0 % (ref 0.0–0.2)

## 2018-03-02 LAB — BASIC METABOLIC PANEL
Anion gap: 5 (ref 5–15)
BUN: 12 mg/dL (ref 6–20)
CO2: 28 mmol/L (ref 22–32)
Calcium: 7.6 mg/dL — ABNORMAL LOW (ref 8.9–10.3)
Chloride: 103 mmol/L (ref 98–111)
Creatinine, Ser: 0.91 mg/dL (ref 0.61–1.24)
GFR calc Af Amer: >60 mL/min (ref >60)
GFR calc non Af Amer: >60 mL/min (ref >60)
GLUCOSE: 121 mg/dL — AB (ref 70–99)
Potassium: 3.8 mmol/L (ref 3.5–5.1)
Sodium: 136 mmol/L (ref 135–145)

## 2018-03-02 MED ORDER — NICOTINE 21 MG/24HR TD PT24
21.0000 mg | MEDICATED_PATCH | Freq: Every day | TRANSDERMAL | Status: DC
  Administered 2018-03-02 – 2018-03-06 (×5): 21 mg via TRANSDERMAL
  Filled 2018-03-02 (×5): qty 1

## 2018-03-02 MED ORDER — ACETAMINOPHEN 325 MG PO TABS
650.0000 mg | ORAL_TABLET | Freq: Four times a day (QID) | ORAL | Status: DC | PRN
  Administered 2018-03-02 – 2018-03-03 (×3): 650 mg via ORAL
  Filled 2018-03-02 (×4): qty 2

## 2018-03-02 NOTE — Progress Notes (Signed)
Daily Progress Note   Patient Name: Albert Salazar       Date: 03/02/2018 DOB: 1955-09-17  Age: 63 y.o. MRN#: [id number] Attending Physician: Nita Sells, MD Primary Care Physician: Javier Docker, MD Admit Date: 02/20/2018  Reason for Consultation/Follow-up: Establishing goals of care  Subjective: I met today with Albert Salazar.  He is much more awake and alert today.  He is able to tell me his name, date, and some of the circumstances surrounding his hospitalization.  I still do not feel that he has a full understanding of his medical situation, however.  I talked with him about the past couple of days of hospitalization and the fact that he has been too confused to make his own medical decisions.  I asked him about whom would know him best to help make decisions regarding his care moving forward.  He stated to me that his nephew, Albert Salazar, would be his choice to make decisions or his sister, Albert Salazar, as he states that she understands more about medical things than other members of his family.  He then stated that he feels that he would be okay if any of his sisters Albert Salazar, Albert Salazar, or Detrice) or Josemanuel were to make decisions on his behalf.  I asked him about the names on the board Albert Salazar, [place], and [place]).  He tells me that Albert Salazar and Albert Salazar are his siblings and that of the 3 numbers that are available, he feels that Albert Salazar would be the best person for me to call.  I called and was able to reach his sister, Albert Salazar.  Albert Salazar shared with me that family remains concerned about Albert Salazar and his overall condition due to the fact that he has a history of alcoholism.  He is currently living with his prior girlfriends son Albert Salazar) and they have been sharing a home since Albert Salazar's mother (Albert Salazar's  girlfriend of more than 13 years) died last year.  Albert Salazar reports that they have a good relationship and get along well, however, she and her siblings are concerned about his living situation.  Although she did not directly state why, she seemed to allude to the fact that he is in a position where alcohol is readily available in his current home environment.  Albert Salazar reports that Albert Salazar has 2 brothers and 3 sisters who are  living (1 sister and 1 brother are deceased).  He reports that she will discuss with Albert Salazar and her brothers and sisters regarding who may be willing to help participate in decision-making on her brother's behalf.  I went through surrogate decision making per [facility] law with her and it appears from this that legal decision maker would be a daughter Albert Salazar - ? Spelling) who lives out of state and does not have contact with her father due to his alcoholism.  She does not have contact information nor know where she lives.  The last she had heard, she lives in [place] but does not know the city.  Length of Stay: 10  Current Medications: Scheduled Meds:  . cloNIDine  0.1 mg Oral TID  . glycopyrrolate  0.1 mg Intravenous BID  . mouth rinse  15 mL Mouth Rinse BID  . miconazole   Topical BID  . nicotine  21 mg Transdermal Daily  . tamsulosin  0.4 mg Oral Daily  . thiamine  100 mg Oral Daily   Or  . thiamine  100 mg Intravenous Daily    Continuous Infusions: . dextrose 5 % and 0.9% NaCl 50 mL/hr at 03/02/18 0415  . piperacillin-tazobactam (ZOSYN)  IV 3.375 g (03/02/18 0909)    PRN Meds: acetaminophen, acetaminophen, bisacodyl, hydrALAZINE, ipratropium-albuterol, phenol  Physical Exam      General: Alert, awake, in no acute distress.  HEENT: No bruits, no goiter, no JVD Heart: Regular rate and rhythm. No murmur appreciated. Lungs: Fair air movement, clear Abdomen: Soft, nontender, nondistended, positive bowel sounds.  Skin: Warm and dry Neuro: Moves 4  extremities     Vital Signs: BP 131/66 (BP Location: Left Arm)   Pulse 85   Temp 100 F (37.8 C) (Oral)   Resp 18   Ht 5' 9"  (1.753 m)   Wt 95.9 kg   SpO2 91%   BMI 31.22 kg/m  SpO2: SpO2: 91 % O2 Device: O2 Device: Nasal Cannula O2 Flow Rate: O2 Flow Rate (L/min): 2 L/min  Intake/output summary:   Intake/Output Summary (Last 24 hours) at 03/02/2018 1439 Last data filed at 03/02/2018 1401 Gross per 24 hour  Intake 1192.25 ml  Output 1700 ml  Net -507.75 ml   LBM: Last BM Date: 02/28/18 Baseline Weight: Weight: 74.8 kg Most recent weight: Weight: 95.9 kg       Palliative Assessment/Data:      Patient Active Problem List   Diagnosis Date Noted  . Aspiration pneumonia ([name]) 02/27/2018  . Bacteremia due to Gram-negative bacteria 02/27/2018  . Tachypnea   . Alcohol withdrawal ([name]) 02/20/2018  . Thrombocytopenia ([name]) 06/13/2014  . Alcohol intoxication ([place]) 06/13/2014  . Normocytic anemia 06/13/2014  . Peripheral edema 06/13/2014  . Left epididymitis 06/13/2014  . Dysuria 06/13/2014  . Alcohol abuse   . Alcoholic cirrhosis of liver with ascites ([name])   . Chronic hepatitis C without hepatic coma ([name]) 06/07/2014  . Hepatic cirrhosis ([name]) 06/07/2014  . Elevated BP 04/21/2014  . Alcohol use disorder, severe, dependence ([name]) 12/30/2013  . Cocaine use disorder, mild, abuse ([name]) 12/30/2013  . Substance induced mood disorder ([name]) 12/30/2013    Palliative Care Assessment & Plan   Patient Profile: 63 y.o. male  with past medical history of ETOH, cirrhosis, hep C, polysubstance abuse, ascites, possible epididymitis, thrombocytopenia admitted on 02/20/2018 with E coli bacteremia, alcohol withdrawal, and dysphagia.  Continued concern about him aspirating moving forward as well as significant thrombocytopenia (chronic from alcohol, but  acutely worse this hospitalization.   Recommendations/Plan:  Albert Salazar has improved mental status today, however, I still do not think  that he understands his complete medical situation.  He stated today that his preference for surrogate decision maker would be his nephew, [name], or his sisters.  I will follow-up again with him tomorrow to see if he is consistent with this and would recommend completion of healthcare power of attorney documents once we determine who would be his preferred surrogate and if that person is willing to serve in that capacity.  I spoke with his sister Albert Salazar today who will continue conversation amongst their family about who would be willing to help make decisions on Juston's behalf.  Technically, at this point, his legal decision maker would appear to be his daughter, however, she has not been in contact with him for "years" per his sister and there is not a known way to try and reach out to her.  While I remain concerned about Mr. Labell's ability to process his overall medical situations to independently make his healthcare decisions, I do think that he has the capacity to name whom he would like to assist him as surrogate decision maker to assist in his healthcare decisions.  When asked today about aggressiveness of care, he reports "I think so" when asked about aggressive interventions.  I think this would best be revisited with him and family once it is determined who will be helping him make decisions.  That can either be done here if he is still admitted, or he can have follow-up as an outpatient from outpatient palliative care services (particularly if he is going to transition to facility for rehab).  Goals of Care and Additional Recommendations:  Limitations on Scope of Treatment: Full Scope Treatment  Code Status:    Code Status Orders  (From admission, onward)         Start     Ordered   02/20/18 1611  Full code  Continuous     02/20/18 1619        Code Status History    Date Active Date Inactive Code Status Order ID Comments User Context   02/20/2018 1536 02/20/2018 1619 Full Code  [id number]  Frederica Kuster, PA-C ED   06/13/2014 1627 06/15/2014 1436 Full Code [id number]  Lucious Groves, DO Inpatient   12/29/2013 1802 12/30/2013 1823 Full Code [id number]  Dorie Rank, MD ED       Prognosis:   Unable to determine  Discharge Planning:  [facility] for rehab with Palliative care service follow-up most likely.  Care plan was discussed with RN, sister Albert Salazar  Thank you for allowing the Palliative Medicine Team to assist in the care of this patient.   Time In: 1345 Time Out: 1430 Total Time 45 Prolonged Time Billed No      Greater than 50%  of this time was spent counseling and coordinating care related to the above assessment and plan.  Micheline Rough, MD  Please contact Palliative Medicine Team phone at [phone] for questions and concerns.

## 2018-03-02 NOTE — Progress Notes (Signed)
[facility] PROGRESS NOTE  Albert Salazar XBJ:[id number] DOB: 06-11-1955 DOA: 02/20/2018 PCP: Albert CreasePavelock, Richard M, MD   Narrative: 63 year old male Known history of EtOH, cirrhosis secondary to the same, hep CWithout specific treatment, polysubstance abuse, prior admission to hospital 05/2014 Admit with cirrhosis ascites and possible epididymitis at that time  Admitted 1/23 with questionable sepsis started on empiric antibiotics and fluid hydration-CT scan showed BPH right inguinal fluid collection?  Hydrocele -On critical care service until 1/30 when he was tapered off of his Precedex placed on Librium clonidine tapers  A & Plan Alcohol withdrawal and DTs Coherent alert oriented today CIWA score ranging 3-7 so discontinue Librium protocol Can may be discontinued safety sitter in 24 E. coli bacteremia We will continue at this time Zosyn it appears he was on IV fluids 50 cc/h D5 allow diet may be able to come off on IV antibiotics and fluids a.Salazar. if eating enough Continue Zosyn for the time being Aspiration pneumonia 1/28 Speech therapy cleared the patient for dysphagia 3 diet Continue Zosyn for nowLeukocytosis trended down to 10 on discharge Repeat 2 view chest x-ray a.Salazar. to compare from prior Dysphagia-see above Inguinal hydrocele Stable at this time [place] This has resolved with fluid repletion his bicarb is 28 Bicarb was discontinued on 2/1 Cirrhosis and hep C Thrombocytopenia This is now more profound with platelets in the 12-13 range-he is at very high risk for spontaneous bleeding will be monitored Prior cocaine abuse  As above Urinary retention  Keep Foley Right upper extremity swelling negative for DVT Anemia   Presumed full code unable to determine Long discussion with nephew yesterday no surrogate power of attorney note input from palliative and appreciated with thanks-expect poor overall prognosis but may be can go to skilled facility in the next 2 to 3 days if  stabilizes to the degree that he can work with therapy Inpatient   Albert Maturin, MD  [facility] [employer] app OR [email or web address] 7PM-7AM contact night coverage as above 03/02/2018, 10:22 AM  LOS: 10 days   Consultants:  Palliative care  Procedures:  None  Antimicrobials:  Zosyn  Interval history/Subjective: More coherent cognizant awake alert Eating drinking to some degree no chest pain can tell me date place time year and season without hesitation No reported fevers overnight  Objective:  Vitals:  Vitals:   03/01/18 2127 03/02/18 0558  BP: 132/64 136/63  Pulse: 81 74  Resp: 16 20  Temp: 99.1 F (37.3 C) 98.8 F (37.1 C)  SpO2: 97% 97%    Exam:  Awake alert pleasant coherent Chest is clear but decreased air entry and increased fremitus right posterior laterally Abdomen is soft no rebound no guarding Catheter in place No lower extremity edema   I have personally reviewed the following:  DATA   Labs:  BUN/creatinine 13/0.9-->12/0.9, LFTs not done today  CBC unremarkable no white count  Imaging studies: CXR to be done morning Medical tests:     Test discussed with performing physician:    Decision to obtain old records:    Review and summation of old records:    Scheduled Meds: . cloNIDine  0.1 mg Oral TID  . glycopyrrolate  0.1 mg Intravenous BID  . mouth rinse  15 mL Mouth Rinse BID  . miconazole   Topical BID  . tamsulosin  0.4 mg Oral Daily  . thiamine  100 mg Oral Daily   Or  . thiamine  100 mg Intravenous Daily   Continuous Infusions: . dextrose 5 %  and 0.9% NaCl 50 mL/hr at 03/02/18 0415  . piperacillin-tazobactam (ZOSYN)  IV 3.375 g (03/02/18 0909)    Principal Problem:   Alcohol withdrawal (HCC) Active Problems:   Alcohol use disorder, severe, dependence (HCC)   Cocaine use disorder, mild, abuse (HCC)   Chronic hepatitis C without hepatic coma (HCC)   Thrombocytopenia (HCC)   Alcohol intoxication (HCC)    Alcoholic cirrhosis of liver with ascites (HCC)   Aspiration pneumonia (HCC)   Bacteremia due to Gram-negative bacteria   Tachypnea   LOS: 10 days

## 2018-03-03 ENCOUNTER — Inpatient Hospital Stay (HOSPITAL_COMMUNITY): Payer: Medicaid Other

## 2018-03-03 LAB — CBC WITH DIFFERENTIAL/PLATELET
Abs Immature Granulocytes: 0.02 10*3/uL (ref 0.00–0.07)
BASOS ABS: 0 10*3/uL (ref 0.0–0.1)
Basophils Relative: 0 %
Eosinophils Absolute: 0 10*3/uL (ref 0.0–0.5)
Eosinophils Relative: 0 %
HCT: 32.2 % — ABNORMAL LOW (ref 39.0–52.0)
Hemoglobin: 9.9 g/dL — ABNORMAL LOW (ref 13.0–17.0)
IMMATURE GRANULOCYTES: 0 %
Lymphocytes Relative: 26 %
Lymphs Abs: 1.7 10*3/uL (ref 0.7–4.0)
MCH: 33.3 pg (ref 26.0–34.0)
MCHC: 30.7 g/dL (ref 30.0–36.0)
MCV: 108.4 fL — ABNORMAL HIGH (ref 80.0–100.0)
Monocytes Absolute: 0.5 10*3/uL (ref 0.1–1.0)
Monocytes Relative: 7 %
NEUTROS PCT: 67 %
NRBC: 0 % (ref 0.0–0.2)
Neutro Abs: 4.3 10*3/uL (ref 1.7–7.7)
PLATELETS: 14 10*3/uL — AB (ref 150–400)
RBC: 2.97 MIL/uL — ABNORMAL LOW (ref 4.22–5.81)
RDW: 14.5 % (ref 11.5–15.5)
WBC: 6.6 10*3/uL (ref 4.0–10.5)

## 2018-03-03 LAB — COMPREHENSIVE METABOLIC PANEL
ALT: 35 U/L (ref 0–44)
AST: 65 U/L — ABNORMAL HIGH (ref 15–41)
Albumin: 1.5 g/dL — ABNORMAL LOW (ref 3.5–5.0)
Alkaline Phosphatase: 62 U/L (ref 38–126)
Anion gap: 4 — ABNORMAL LOW (ref 5–15)
BUN: 11 mg/dL (ref 6–20)
CHLORIDE: 104 mmol/L (ref 98–111)
CO2: 26 mmol/L (ref 22–32)
Calcium: 7.7 mg/dL — ABNORMAL LOW (ref 8.9–10.3)
Creatinine, Ser: 0.75 mg/dL (ref 0.61–1.24)
GFR calc Af Amer: >60 mL/min (ref >60)
GFR calc non Af Amer: >60 mL/min (ref >60)
Glucose, Bld: 114 mg/dL — ABNORMAL HIGH (ref 70–99)
Potassium: 3.6 mmol/L (ref 3.5–5.1)
Sodium: 134 mmol/L — ABNORMAL LOW (ref 135–145)
Total Bilirubin: 0.9 mg/dL (ref 0.3–1.2)
Total Protein: 5.5 g/dL — ABNORMAL LOW (ref 6.5–8.1)

## 2018-03-03 MED ORDER — CLONIDINE HCL 0.1 MG PO TABS
0.1000 mg | ORAL_TABLET | Freq: Two times a day (BID) | ORAL | Status: DC
  Administered 2018-03-03: 0.1 mg via ORAL
  Filled 2018-03-03: qty 1

## 2018-03-03 MED ORDER — AMOXICILLIN-POT CLAVULANATE 400-57 MG/5ML PO SUSR
875.0000 mg | Freq: Two times a day (BID) | ORAL | Status: AC
  Administered 2018-03-03 – 2018-03-05 (×6): 875 mg via ORAL
  Filled 2018-03-03 (×6): qty 10.9

## 2018-03-03 MED ORDER — POLYETHYLENE GLYCOL 3350 17 G PO PACK
17.0000 g | PACK | Freq: Every day | ORAL | Status: DC
  Administered 2018-03-03 – 2018-03-06 (×4): 17 g via ORAL
  Filled 2018-03-03 (×4): qty 1

## 2018-03-03 NOTE — Progress Notes (Signed)
[facility] PROGRESS NOTE  Albert Salazar [id number] DOB: December 05, 1955 DOA: 02/20/2018 PCP: Gilda Crease, MD   Narrative: 63 year old male Known history of EtOH, cirrhosis secondary to the same, hep CWithout specific treatment, polysubstance abuse, prior admission to hospital 05/2014 Admit with cirrhosis ascites and possible epididymitis at that time  Admitted 1/23 with questionable sepsis started on empiric antibiotics and fluid hydration-CT scan showed BPH right inguinal fluid collection?  Hydrocele -On critical care service until 1/30 when he was tapered off of his Precedex placed on Librium clonidine tapers  A & Plan Alcohol withdrawal and DTs Coherent alert oriented today Much more coherent Librium protocol discontinued 2/2  discontinued safety sitter 2/3 as patient more coherent Patient has been educated to some degree about risk of alcoholism Cutting back clonidine to twice daily 0.1 mg E. coli bacteremia Transitioning Zosyn to Augmentin as able to take p.o. complete course 2/5 Discontinue IV saline 50 cc/h Aspiration pneumonia 1/28 Speech therapy cleared the patient for dysphagia 3 diet Chest x-ray as below He is coughing at the bedside and although he is much improved we will need a surrogate decision-maker prior to disposition-I suspect he has a poor overall outcome if he continues to drink Repeat CBC in a.m. Dysphagia-see above Inguinal hydrocele Smoker  Given nicotine patch Stable at this time Albert Salazar This has resolved with fluid repletion his bicarb is 28 Bicarb was discontinued on 2/1 Cirrhosis and hep C Thrombocytopenia This is now more profound with platelets in the 12-13 range-he is at very high risk for spontaneous bleeding will be monitored Prior cocaine abuse  As above Urinary retention  Keep Foley-bladder scan can be changed to every shift, continue Flomax 0.4 daily Right upper extremity swelling negative for DVT Anemia   Presumed full code  unable to determine Appreciate palliative input no family present today Expect can discharge to skilled facility in 24 to 48 hours   Albert Grivas, MD  [facility] [employer] app OR [email or web address] 7PM-7AM contact night coverage as above 03/03/2018, 10:33 AM  LOS: 11 days   Consultants:  Palliative care  Procedures:  None  Antimicrobials:  Zosyn  Interval history/Subjective:  Awake cognizant sitting up in bed strong cough while eating but no distress no fever No sputum Coherent and makes sense No nausea no vomiting Has not passed a stool since admission  Objective:  Vitals:  Vitals:   03/02/18 2326 03/03/18 0551  BP: (!) 144/72 (!) 152/82  Pulse: (!) 56 78  Resp: 16 18  Temp: 97.7 F (36.5 C) 97.8 F (36.6 C)  SpO2: 99% 99%    Exam:  Awake alert pleasant coherent Chest is clear but decreased air entry and increased fremitus right posterior laterally Abdomen is soft no rebound no guarding Catheter in place No lower extremity edema   I have personally reviewed the following:  DATA   Labs:  BUN/creatinine has resolved-AST minimally e3lvated 65, albumin 1.5  CBC unremarkable no white count  Imaging studies: CXR shows congestion with pleural effusion bilaterally left greater than right superimposed pneumonia left base Medical tests:    Scheduled Meds: . amoxicillin-clavulanate  875 mg Oral Q12H  . cloNIDine  0.1 mg Oral BID  . miconazole   Topical BID  . nicotine  21 mg Transdermal Daily  . polyethylene glycol  17 g Oral Daily  . tamsulosin  0.4 mg Oral Daily   Continuous Infusions:   Principal Problem:   Alcohol withdrawal (HCC) Active Problems:   Alcohol use disorder, severe, dependence (  HCC)   Cocaine use disorder, mild, abuse (HCC)   Chronic hepatitis C without hepatic coma (HCC)   Thrombocytopenia (HCC)   Alcohol intoxication (HCC)   Alcoholic cirrhosis of liver with ascites (HCC)   Aspiration pneumonia (HCC)   Bacteremia due to  Gram-negative bacteria   Tachypnea   LOS: 11 days

## 2018-03-03 NOTE — Progress Notes (Signed)
Physical Therapy Treatment Patient Details Name: Albert Salazar MRN: [id number] DOB: January 20, 1956 Today's Date: 03/03/2018    History of Present Illness 63 yo male admitted diagnoses of  ETOH withdrawal, UTI, bacteremia, delirium. Hx of polysubstance abuse, bipolar d/o, Hep C L eye blindness.     PT Comments    Improved cognition compared to last session. Mod encouragement for progression of activity. Pt is currently very weak, shaky, and at high risk for falls. Will need SNF for continued PT/OT.    Follow Up Recommendations  SNF     Equipment Recommendations  Rolling walker with 5" wheels    Recommendations for Other Services       Precautions / Restrictions Precautions Precautions: Fall Restrictions Weight Bearing Restrictions: No    Mobility  Bed Mobility               General bed mobility comments: oob in recliner  Transfers Overall transfer level: Needs assistance Equipment used: Rolling walker (2 wheeled) Transfers: Sit to/from Stand Sit to Stand: Mod assist;+2 physical assistance;+2 safety/equipment;From elevated surface         General transfer comment: Assist to rise, stabilize, control descent. Moderate poserior bias-required assist to prevent fall. Wide BOS and trunk flexion noted. Cues for safety, posture  Ambulation/Gait Ambulation/Gait assistance: Mod assist;+2 physical assistance;+2 safety/equipment Gait Distance (Feet): 4 Feet Assistive device: Rolling walker (2 wheeled) Gait Pattern/deviations: Step-to pattern;Wide base of support;Trunk flexed;Decreased step length - left;Decreased step length - right;Shuffle     General Gait Details: Assist to stabilize/support pt and manage RW. Mod cues for safety, posture, distance from RW, proper use of RW. Encouragement required for pt to take a few steps. Followed closely with recliner.    Stairs             Wheelchair Mobility    Modified Rankin (Stroke Patients Only)       Balance Overall  balance assessment: Needs assistance         Standing balance support: Bilateral upper extremity supported Standing balance-Leahy Scale: Poor                              Cognition Arousal/Alertness: Awake/alert Behavior During Therapy: WFL for tasks assessed/performed Overall Cognitive Status: No family/caregiver present to determine baseline cognitive functioning                                        Exercises      General Comments        Pertinent Vitals/Pain Pain Assessment: No/denies pain    Home Living                      Prior Function            PT Goals (current goals can now be found in the care plan section) Progress towards PT goals: Progressing toward goals    Frequency    Min 2X/week      PT Plan Current plan remains appropriate    Co-evaluation              AM-PAC PT "6 Clicks" Mobility   Outcome Measure  Help needed turning from your back to your side while in a flat bed without using bedrails?: A Lot Help needed moving from lying on your back to sitting on the side of a  flat bed without using bedrails?: A Lot Help needed moving to and from a bed to a chair (including a wheelchair)?: A Lot Help needed standing up from a chair using your arms (e.g., wheelchair or bedside chair)?: A Lot Help needed to walk in hospital room?: Total Help needed climbing 3-5 steps with a railing? : Total 6 Click Score: 10    End of Session Equipment Utilized During Treatment: Gait belt Activity Tolerance: Patient limited by fatigue Patient left: in chair;with call bell/phone within reach;with chair alarm set   PT Visit Diagnosis: Muscle weakness (generalized) (M62.81);Difficulty in walking, not elsewhere classified (R26.2);Unsteadiness on feet (R26.81)     Time: 0960-4540 PT Time Calculation (min) (ACUTE ONLY): 8 min  Charges:  $Gait Training: 8-22 mins             Rebeca Alert, PT Acute Rehabilitation  Services Pager: [phone] Office: [phone]

## 2018-03-03 NOTE — Progress Notes (Signed)
Daily Progress Note   Patient Name: Albert Salazar       Date: 03/03/2018 DOB: 09/17/1955  Age: 63 y.o. MRN#: [id number] Attending Physician: Nita Sells, MD Primary Care Physician: Javier Docker, MD Admit Date: 02/20/2018  Reason for Consultation/Follow-up: Establishing goals of care  Subjective: I met today with Albert Salazar.  He is awake and alert and sitting in bedside chair.   He recalls conversation from yesterday, and he reports that he wants to name his nephew, Albert Salazar, as his HCPOA.  I asked him about his daughter, Albert Salazar, and he reported that they do not speak and she would not be his choice to make decisions on his behalf.  Also attempted to discuss further regarding his [place] as his mental status has certainly improved.  We discussed concerns about dysphagia as well as thrombocytopenia as well as his thoughts on care at the end of his life.  He reported that he has not really considered, but "my nephew will know what to do."  Length of Stay: 11  Current Medications: Scheduled Meds:  . amoxicillin-clavulanate  875 mg Oral Q12H  . cloNIDine  0.1 mg Oral BID  . miconazole   Topical BID  . nicotine  21 mg Transdermal Daily  . polyethylene glycol  17 g Oral Daily  . tamsulosin  0.4 mg Oral Daily    Continuous Infusions:   PRN Meds: acetaminophen, acetaminophen, hydrALAZINE, ipratropium-albuterol, phenol  Physical Exam      General: Alert, awake, in no acute distress.  HEENT: No bruits, no goiter, no JVD Heart: Regular rate and rhythm. No murmur appreciated. Lungs: Fair air movement, clear Abdomen: Soft, nontender, nondistended, positive bowel sounds.  Skin: Warm and dry Neuro: Moves 4 extremities     Vital Signs: BP (!) 152/82 (BP  Location: Right Arm) Comment: RN notified  Pulse 78   Temp 97.8 F (36.6 C) (Oral)   Resp 18   Ht 5' 9" (1.753 m)   Wt 95.9 kg   SpO2 99%   BMI 31.22 kg/m  SpO2: SpO2: 99 % O2 Device: O2 Device: Nasal Cannula O2 Flow Rate: O2 Flow Rate (L/min): 2 L/min  Intake/output summary:   Intake/Output Summary (Last 24 hours) at 03/03/2018 1800 Last data filed at 03/03/2018 1300 Gross per 24 hour  Intake 398.7 ml  Output 1200 ml  Net -801.3 ml   LBM: Last BM Date: 02/28/18 Baseline Weight: Weight: 74.8 kg Most recent weight: Weight: 95.9 kg       Palliative Assessment/Data:    Flowsheet Rows     Most Recent Value  Intake Tab  Referral Department  Hospitalist  Unit at Time of Referral  Med/Surg Unit  Date Notified  02/28/18  Palliative Care Type  New Palliative care  Reason for referral  Clarify Goals of Care  Date of Admission  02/20/18  Date first seen by Palliative Care  03/01/18  # of days Palliative referral response time  1 Day(s)  # of days IP prior to Palliative referral  8  Clinical Assessment  Psychosocial & Spiritual Assessment  Palliative Care Outcomes      Patient Active Problem List   Diagnosis Date Noted  . Aspiration pneumonia ([place]) 02/27/2018  . Bacteremia due to Gram-negative bacteria 02/27/2018  . Tachypnea   . Alcohol withdrawal ([place]) 02/20/2018  . Thrombocytopenia ([place]) 06/13/2014  . Alcohol intoxication ([place]) 06/13/2014  . Normocytic anemia 06/13/2014  . Peripheral edema 06/13/2014  . Left epididymitis 06/13/2014  . Dysuria 06/13/2014  . Alcohol abuse   . Alcoholic cirrhosis of liver with ascites ([place])   . Chronic hepatitis C without hepatic coma ([place]) 06/07/2014  . Hepatic cirrhosis ([place]) 06/07/2014  . Elevated BP 04/21/2014  . Alcohol use disorder, severe, dependence ([place]) 12/30/2013  . Cocaine use disorder, mild, abuse ([place]) 12/30/2013  . Substance induced mood disorder ([place]) 12/30/2013    Palliative Care Assessment & Plan   Patient  Profile: 63 y.o. male  with past medical history of ETOH, cirrhosis, hep C, polysubstance abuse, ascites, possible epididymitis, thrombocytopenia admitted on 02/20/2018 with E coli bacteremia, alcohol withdrawal, and dysphagia.  Continued concern about him aspirating moving forward as well as significant thrombocytopenia (chronic from alcohol, but acutely worse this hospitalization.   Recommendations/Plan:  Albert Salazar has improved mental status today, however, he was not willing to discuss regarding goals of care other than to repeat today that his preference for surrogate decision maker would be his nephew, Albert Salazar.  I recommended completion of healthcare power of attorney documents and he is agreeable to this (reports happy that he will not have to pay his lawyer).  I placed referral to spiritual care.  When asked today about aggressiveness of care, he reports "my nephew will know what to do" when asked about aggressive interventions.  I think this would best be revisited with him and his nephew once HCPOA documents are completed.  That can either be done here if he is still admitted, or he can have follow-up as an outpatient from outpatient palliative care services (particularly if he is going to transition to facility for rehab).  Goals of Care and Additional Recommendations:  Limitations on Scope of Treatment: Full Scope Treatment  Code Status:    Code Status Orders  (From admission, onward)         Start     Ordered   02/20/18 1611  Full code  Continuous     02/20/18 1619        Code Status History    Date Active Date Inactive Code Status Order ID Comments User Context   02/20/2018 1536 02/20/2018 1619 Full Code [id number]  Frederica Kuster, PA-C ED   06/13/2014 1627 06/15/2014 1436 Full Code [id number]  Lucious Groves, DO Inpatient   12/29/2013 1802 12/30/2013 1823 Full Code [id number]  Dorie Rank,  MD ED       Prognosis:   Unable to determine  Discharge Planning:  [place] for rehab with Palliative care service follow-up most likely.  Care plan was discussed with RN  Thank you for allowing the Palliative Medicine Team to assist in the care of this patient.   Total Time 30 Prolonged Time Billed No      Greater than 50%  of this time was spent counseling and coordinating care related to the above assessment and plan.  Micheline Rough, MD  Please contact Palliative Medicine Team phone at [phone] for questions and concerns.

## 2018-03-03 NOTE — Progress Notes (Signed)
  Speech Language Pathology Treatment: Dysphagia  Patient Details Name: Albert Salazar MRN: [id number] DOB: 05/29/1955 Today's Date: 03/03/2018 Time: 3559-7416 SLP Time Calculation (min) (ACUTE ONLY): 30 min  Assessment / Plan / Recommendation Clinical Impression  Pt today is much more alert with clearer speech *although remains dysarthric.  He was stirring coffee upon SLP entrance to room.  Pt answered phone and spoke to sister for approx 10 minutes while sipping on his coffee.  After 3rd sip - pt overtly coughed - suspect this occurred prior to swallow due to bolus spilling into open airway.  Pt coughed and expectorated secretions mixed with coffee.  He does admit he does this "sometimes" at home prior to admit.  Pt DID cough during MBS and this was no coorelated to penetration/aspiration.   Further assessments of thin and graham cracker boluses did not result in pt coughing - suspect improved tolerance was due to decreased bolus size and pt's concentration.  Chin tuck posture did not clinically appear helpful.    Using teach back, reinforced need for pt to consume small single bites/sips - and focus on swallowing. He was able to teach back this need As pt is afebrile with intake listed as 75% and has chronicity of dysphagia, he appears to be managing well at this time.   Will conduct RMST to improve cough mechanisms and laryngeal elevation.  Pt agreeable.  Please consider OT order for pt's ADLs as he is weak and has some difficulty with pouring drink, etc.        HPI HPI: 63 y.o. male admitted with alcohol withdrawal/DTs, sepsis, inguinal hydrocele, hx of cirrhosis and bipolar disorder.  MBS on 02/27/18 showed oropharyngeal phase dysphagia. Per nurse, noted to be coughing while eating with an aspiration event last night.  Nurse also reported pt was coughing after drinking water this morning, but took pills whole with applesauce.  Pt was made npo on friday pm and reevaluated by SLP Sat pm with  return to dys3/thin diet.  Mentation has been significant factor for pt's airway protection.        SLP Plan  Continue with current plan of care       Recommendations  Diet recommendations: Dysphagia 3 (mechanical soft);Thin liquid Liquids provided via: Cup;Straw Medication Administration: Whole meds with puree Supervision: Full supervision/cueing for compensatory strategies Compensations: Small sips/bites;Slow rate;Minimize environmental distractions(intermittent dry swallow) Postural Changes and/or Swallow Maneuvers: Seated upright 90 degrees;Upright 30-60 min after meal                Oral Care Recommendations: Oral care QID Follow up Recommendations: Other (comment)(tba) SLP Visit Diagnosis: Dysphagia, oropharyngeal phase (R13.12) Plan: Continue with current plan of care       GO                [name] 03/03/2018, 5:12 PM  [name], MS [facility] SLP Acute Rehab Services Pager [phone] Office [phone]

## 2018-03-04 MED ORDER — CLONIDINE HCL 0.1 MG PO TABS
0.1000 mg | ORAL_TABLET | Freq: Every day | ORAL | Status: DC
  Administered 2018-03-04 – 2018-03-05 (×2): 0.1 mg via ORAL
  Filled 2018-03-04 (×2): qty 1

## 2018-03-04 NOTE — Progress Notes (Signed)
LCSW called at dc regarding placement for patient. No formal consult.   Patient has [place] [employer] and does not have a payor source for SNF placement.   LCSW signing off. No CSW needs. Please submit new consult if CSW needs arise.   Beulah GandyBernette Ruba Outen, LSCW [facility] CSW [phone]

## 2018-03-04 NOTE — Progress Notes (Signed)
[facility] PROGRESS NOTE  Albert Salazar PYK:[id number] DOB: Apr 04, 1955 DOA: 02/20/2018 PCP: Gilda Crease, MD   Narrative: 63 year old male Known history of EtOH, cirrhosis secondary to the same, hep CWithout specific treatment, polysubstance abuse, prior admission to hospital 05/2014 Admit with cirrhosis ascites and possible epididymitis at that time  Admitted 1/23 with questionable sepsis started on empiric antibiotics and fluid hydration-CT scan showed BPH right inguinal fluid collection?  Hydrocele -On critical care service until 1/30 when he was tapered off of his Precedex placed on Librium clonidine tapers  A & Plan Alcohol withdrawal and DTs Intermittent confusion but much clearer than before  Cutting back clonidine to daily 0.1 mg E. coli bacteremia Was on Zosyn-now Augmentin since 2/4--completion date 2/5 [14 d] Aspiration pneumonia 1/28 Speech therapy cleared the patient for dysphagia 3 diet Chest x-ray 2/4 as below Cont dys diet Dysphagia-see above Inguinal hydrocele Smoker  Given nicotine patch Stable at this time Albert Salazar This has resolved with fluid repletion his bicarb is 28 Bicarb was discontinued on 2/1 Cirrhosis and hep C Thrombocytopenia imporved Prior cocaine abuse  As above Urinary retention Keep Foley-bladder scan can be changed to every shift, continue Flomax 0.4 daily Right upper extremity swelling negative for DVT Anemia  scd Presumed full code  Doesn't have SNF benefits --reached out to [facility] [phone]--no answer--might be disposition issue   Albert Menghini, MD  [facility] Via [employer] app OR [email or web address] 7PM-7AM contact night coverage as above 03/04/2018, 1:40 PM  LOS: 12 days   Consultants:  Palliative care  Procedures:  None  Antimicrobials:  Zosyn  Interval history/Subjective: awoken from sleep and ,ildy conmfused No cp No fever No bleeding Able to converse  Objective:  Vitals:  Vitals:   03/03/18 2131  03/04/18 0549  BP: (!) 145/69 (!) 157/68  Pulse: 76 84  Resp: 16 (!) 24  Temp: 98.2 F (36.8 C) 97.9 F (36.6 C)  SpO2: 92% 92%    Exam:  Awake alert pleasant coherent with some mild confusion on awakeneing Chest is clear  Abdomen is soft no rebound no guarding Catheter in place No lower extremity edema   I have personally reviewed the following:  DATA   Labs:  Hemoglobin stable, plt up to 120  Imaging studies:     Scheduled Meds: . amoxicillin-clavulanate  875 mg Oral Q12H  . cloNIDine  0.1 mg Oral Daily  . miconazole   Topical BID  . nicotine  21 mg Transdermal Daily  . polyethylene glycol  17 g Oral Daily  . tamsulosin  0.4 mg Oral Daily   Continuous Infusions:   Principal Problem:   Alcohol withdrawal (HCC) Active Problems:   Alcohol use disorder, severe, dependence (HCC)   Cocaine use disorder, mild, abuse (HCC)   Chronic hepatitis C without hepatic coma (HCC)   Thrombocytopenia (HCC)   Alcohol intoxication (HCC)   Alcoholic cirrhosis of liver with ascites (HCC)   Aspiration pneumonia (HCC)   Bacteremia due to Gram-negative bacteria   Tachypnea   LOS: 12 days

## 2018-03-05 DIAGNOSIS — R338 Other retention of urine: Secondary | ICD-10-CM

## 2018-03-05 LAB — CBC
HCT: 30.5 % — ABNORMAL LOW (ref 39.0–52.0)
Hemoglobin: 9.6 g/dL — ABNORMAL LOW (ref 13.0–17.0)
MCH: 34.3 pg — ABNORMAL HIGH (ref 26.0–34.0)
MCHC: 31.5 g/dL (ref 30.0–36.0)
MCV: 108.9 fL — ABNORMAL HIGH (ref 80.0–100.0)
Platelets: 25 10*3/uL — CL (ref 150–400)
RBC: 2.8 MIL/uL — ABNORMAL LOW (ref 4.22–5.81)
RDW: 15 % (ref 11.5–15.5)
WBC: 6.4 10*3/uL (ref 4.0–10.5)
nRBC: 0 % (ref 0.0–0.2)

## 2018-03-05 MED ORDER — ACETAMINOPHEN 325 MG PO TABS: 650.0000 mg | ORAL_TABLET | Freq: Four times a day (QID) | ORAL | Status: DC | PRN

## 2018-03-05 MED ORDER — ADULT MULTIVITAMIN W/MINERALS CH
1.0000 | ORAL_TABLET | Freq: Every day | ORAL | Status: DC
  Administered 2018-03-05 – 2018-03-06 (×2): 1 via ORAL
  Filled 2018-03-05 (×2): qty 1

## 2018-03-05 MED ORDER — FOLIC ACID 1 MG PO TABS
1.0000 mg | ORAL_TABLET | Freq: Every day | ORAL | Status: DC
  Administered 2018-03-05 – 2018-03-06 (×2): 1 mg via ORAL
  Filled 2018-03-05 (×2): qty 1

## 2018-03-05 MED ORDER — VITAMIN B-1 100 MG PO TABS
100.0000 mg | ORAL_TABLET | Freq: Every day | ORAL | Status: DC
  Administered 2018-03-05 – 2018-03-06 (×2): 100 mg via ORAL
  Filled 2018-03-05 (×2): qty 1

## 2018-03-05 NOTE — Progress Notes (Signed)
PT Cancellation Note  Patient Details Name: Albert Salazar MRN: [id number] DOB: 1955/05/03   Cancelled Treatment:    Reason Eval/Treat Not Completed: Attempted PT tx session. Pt refused to participate with PT on today despite encouragement. Will check back another day.    Rebeca AlertJannie Jeydi Klingel, PT Acute Rehabilitation Services Pager: [phone] Office: [phone]

## 2018-03-05 NOTE — Progress Notes (Signed)
Chaplain consulted with pt due to spiritual care request for advance directive.    Albert Salazar remembers speaking with Dr. Neale Burly about his wishes.  States that he wishes to name another nephew and his sister as Product manager.  He states he is feeling quite bad and wishes to discuss paperwork tomorrow on chaplain follow up.  Chaplain provided brief education around [place] paperwork.  Left paperwork with Onalee Hua.  Will follow up tomorrow with goal of completing HCPOA paperwork.

## 2018-03-05 NOTE — Progress Notes (Signed)
PROGRESS NOTE   Albert HedgeDavid Warren  ZHY:[id number]    DOB: 12/04/1955    DOA: 02/20/2018  PCP: Gilda CreasePavelock, Richard M, MD   I have briefly reviewed patients previous medical records in [facility].  Brief Narrative:  63 year old male with PMH of alcoholic and hepatitis C cirrhosis, polysubstance abuse, bipolar disorder, blind left eye, admitted by CCM to ICU on 02/20/2018 when he presented with a week history of groin pain, fever, chills.  He was admitted with a UTI, E. coli bacteremia and developed alcohol withdrawal with tremor, severe agitated delirium for which he required Precedex.  Alcohol withdrawal has resolved.  Hospital course complicated by dysphagia and aspiration pneumonia.  [facility]  Significant hospital events  1/23 admitted with sepsis and alcohol withdrawal.  Temperature documented as high as 107.7, however the not clear if this was an error did have chills and fever however.  Cultures were obtained he was started on empiric antibiotics and IV hydration.  seen by urology, CT scan revealed benign prostatic hypertrophy and right inguinal fluid collection was found to have a right hydrocele and felt to be not related to his presentation 1/24: Blood culture showing E. coli, hypotension resolved 1/25: Sedated on Precedex agitated easily 1/27: Remains on Precedex. Started librium 1/28: Add clonidine, continue Librium and try to taper Precedex off.  Awaiting blood cultures as follow-up 1/29: Librium and clonidine continued.  Precedex discontinued.   Assessment & Plan:   Principal Problem:   Alcohol withdrawal (HCC) Active Problems:   Alcohol use disorder, severe, dependence (HCC)   Cocaine use disorder, mild, abuse (HCC)   Chronic hepatitis C without hepatic coma (HCC)   Thrombocytopenia (HCC)   Alcohol intoxication (HCC)   Alcoholic cirrhosis of liver with ascites (HCC)   Aspiration pneumonia (HCC)   Bacteremia due to Gram-negative bacteria    Tachypnea   Alcohol withdrawal DTs: Initially admitted by CCM to ICU.  Treated with Precedex drip followed by Librium and clonidine taper-discontinued 2/5.  This has clinically resolved.  Continue thiamine, folate and multivitamins.  Aspiration pneumonia 1/28: Due to dysphagia and altered mental status.  Initially treated with IV ceftriaxone and then transitioned to oral Augmentin.  Remains at risk due to dysphagia.  Strict aspiration precautions.  Needs follow-up chest x-ray in 4 weeks to ensure resolution of pneumonia findings.  E. coli bacteremia: Suspected due to UTI.  Completed ceftriaxone and now on Augmentin and completes 2/5 (14 days course).  Surveillance blood cultures from 1/27: Negative.  Inguinal hydrocele: Seen by urology and no intervention at this time.  Non-anion gap metabolic acidosis: In the setting of hyperchloremia and hypomagnesemia: Resolved.  History of cirrhosis and hepatitis C: Periodically follow LFTs.  Thrombocytopenia: Platelet count was as low as 11.  Likely related to alcoholism.  Appears to be chronic with baseline in the 40s-70s dating back to 2017.  No bleeding reported.  This is improved to 25.  Follow CBCs.  Macrocytic anemia/anemia of chronic disease: Hemoglobin stable in the 9 g range.  Cocaine abuse  Tobacco abuse: Cessation counseled.  Nicotine patch.  Acute urinary retention: Foley catheter was placed this admission.  Flomax started.  DC Foley on 2/5 and attempt voiding trial.  Right upper extremity swelling: Venous Doppler negative for DVT.  Dysphagia: Continue dysphagia 3 diet as per SLP recommendations.   DVT prophylaxis: SCDs Code Status: Full Family Communication: Discussed in detail with patient's nephew, updated care and answered questions Disposition: SNF recommended by therapies but patient  does not have insurance or SNF benefits.  Discussed with nephew and family will be able to provide 24/7 assistance upon discharge.  Discontinued  Foley catheter today and if he passes voiding trial then possible discharge home in a.m. with maximum home health therapies.   Consultants:  PCCM Urology PMT  Procedures:  Foley catheter discontinued 2/5  Antimicrobials:  Currently on Augmentin   Subjective: Poor historian but denies specific complaints.  No pain reported.  As per RN, no acute issues noted except ongoing intermittent mild confusion without agitation.  ROS: As above  Objective:  Vitals:   03/04/18 1404 03/04/18 1929 03/05/18 0639 03/05/18 1343  BP: (!) 147/83 (!) 160/79 (!) 151/81 140/74  Pulse: 75 84 83 75  Resp: 17 17 16    Temp: 97.6 F (36.4 C) 98.7 F (37.1 C) 98.1 F (36.7 C) 99.3 F (37.4 C)  TempSrc: Oral Oral Oral Oral  SpO2: 96% 97% 95% 93%  Weight:      Height:        Examination:  General exam: Pleasant middle-age male, moderately built and nourished lying comfortably propped up in bed.  Oral mucosa moist. Respiratory system: Occasional basal crackles but otherwise clear to auscultation.  No increased work of breathing. Cardiovascular system: S1 & S2 heard, RRR. No JVD, murmurs, rubs, gallops or clicks. No pedal edema. Gastrointestinal system: Abdomen is nondistended, soft and nontender. No organomegaly or masses felt. Normal bowel sounds heard. Central nervous system: Alert and oriented to person and place. No focal neurological deficits. Extremities: Symmetric 5 x 5 power. Skin: No rashes, lesions or ulcers Psychiatry: Judgement and insight appear impaired. Mood & affect appropriate. GU: Foley catheter in place.  A drop of pus at tip of penis.  Mild scrotal and penile edema without acute findings.    Data Reviewed: I have personally reviewed following labs and imaging studies  CBC: Recent Labs  Lab 02/27/18 0243 03/01/18 0530 03/02/18 0532 03/03/18 0531 03/04/18 0752 03/05/18 0621  WBC 6.4 7.3 7.6 6.6 6.7 6.4  NEUTROABS 3.8  --  5.0 4.3 4.4  --   HGB 9.4* 9.2* 9.3* 9.9*  9.6* 9.6*  HCT 30.2* 30.5* 30.2* 32.2* 30.5* 30.5*  MCV 109.0* 110.9* 109.0* 108.4* 109.3* 108.9*  PLT 12* 13* 13* 14* 20* 25*   Basic Metabolic Panel: Recent Labs  Lab 02/27/18 0243 02/28/18 0605 03/01/18 0530 03/02/18 0532 03/03/18 0531  NA 139 139 139 136 134*  K 3.0* 3.6 4.1 3.8 3.6  CL 112* 110 105 103 104  CO2 20* 23 28 28 26   GLUCOSE 96 87 89 121* 114*  BUN 14 11 13 12 11   CREATININE 0.78 0.96 0.92 0.91 0.75  CALCIUM 7.7* 7.5* 7.8* 7.6* 7.7*  MG 1.7  --   --   --   --    Liver Function Tests: Recent Labs  Lab 02/27/18 0243 03/01/18 0530 03/03/18 0531  AST 62* 63* 65*  ALT 38 32 35  ALKPHOS 56 52 62  BILITOT 1.4* 1.7* 0.9  PROT 5.8* 5.9* 5.5*  ALBUMIN 1.8* 1.7* 1.5*     Recent Results (from the past 240 hour(s))  Culture, blood (Routine X 2) w Reflex to ID Panel     Status: None   Collection Time: 02/24/18 11:30 AM  Result Value Ref Range Status   Specimen Description BLOOD LEFT ARM  Final   Special Requests   Final    BOTTLES DRAWN AEROBIC AND ANAEROBIC Blood Culture adequate volume Performed at [facility]  [facility], [address] [place], [place] [address]    Culture NO GROWTH 5 DAYS  Final   Report Status 03/01/2018 FINAL  Final  Culture, blood (Routine X 2) w Reflex to ID Panel     Status: None   Collection Time: 02/24/18 11:36 AM  Result Value Ref Range Status   Specimen Description BLOOD LEFT ARM  Final   Special Requests   Final    BOTTLES DRAWN AEROBIC AND ANAEROBIC Blood Culture adequate volume Performed at [facility], [address] [place], [place] [address]    Culture NO GROWTH 5 DAYS  Final   Report Status 03/01/2018 FINAL  Final         Radiology Studies: No results found.      Scheduled Meds: . amoxicillin-clavulanate  875 mg Oral Q12H  . cloNIDine  0.1 mg Oral Daily  . miconazole   Topical BID  . nicotine  21 mg Transdermal Daily  . polyethylene glycol  17 g Oral Daily  . tamsulosin   0.4 mg Oral Daily   Continuous Infusions:   LOS: 13 days     Marcellus Scott, MD, FACP, [facility]. [facility]  To contact the attending provider between 7A-7P or the covering provider during after hours 7P-7A, please log into the web site [email or web address] and access using universal [place] password for that web site. If you do not have the password, please call the hospital operator.  03/05/2018, 3:39 PM

## 2018-03-06 LAB — CBC
HCT: 31.6 % — ABNORMAL LOW (ref 39.0–52.0)
Hemoglobin: 10.1 g/dL — ABNORMAL LOW (ref 13.0–17.0)
MCH: 34.1 pg — ABNORMAL HIGH (ref 26.0–34.0)
MCHC: 32 g/dL (ref 30.0–36.0)
MCV: 106.8 fL — ABNORMAL HIGH (ref 80.0–100.0)
PLATELETS: 29 10*3/uL — AB (ref 150–400)
RBC: 2.96 MIL/uL — ABNORMAL LOW (ref 4.22–5.81)
RDW: 15.1 % (ref 11.5–15.5)
WBC: 6.2 10*3/uL (ref 4.0–10.5)
nRBC: 0 % (ref 0.0–0.2)

## 2018-03-06 MED ORDER — NICOTINE 21 MG/24HR TD PT24: 21.0000 mg | MEDICATED_PATCH | Freq: Every day | TRANSDERMAL | 0 refills | Status: DC

## 2018-03-06 MED ORDER — THIAMINE HCL 100 MG PO TABS: 100.0000 mg | ORAL_TABLET | Freq: Every day | ORAL | 0 refills | Status: DC

## 2018-03-06 MED ORDER — POLYETHYLENE GLYCOL 3350 17 G PO PACK: 17.0000 g | PACK | Freq: Every day | ORAL | 0 refills | Status: DC

## 2018-03-06 MED ORDER — ADULT MULTIVITAMIN W/MINERALS CH: 1.0000 | ORAL_TABLET | Freq: Every day | ORAL | Status: DC

## 2018-03-06 MED ORDER — FOLIC ACID 1 MG PO TABS: 1.0000 mg | ORAL_TABLET | Freq: Every day | ORAL | 0 refills | Status: DC

## 2018-03-06 MED ORDER — TAMSULOSIN HCL 0.4 MG PO CAPS: 0.4000 mg | ORAL_CAPSULE | Freq: Every day | ORAL | 0 refills | Status: DC

## 2018-03-06 NOTE — Care Management Note (Signed)
Case Management Note  Patient Details  Name: Albert Salazar MRN: [id number] Date of Birth: 1955-07-03  Subjective/Objective:                  Discharge Planning  Action/Plan: hhc-RN,PT,OT,Aide and SW through [facility]  Expected Discharge Date:  (unknown)               Expected Discharge Plan:  Home w Home Health Services  In-House Referral:  Clinical Social Work  Discharge planning Services  CM Consult  Post Acute Care Choice:  Home Health Choice offered to:  NA  DME Arranged:    DME Agency:     HH Arranged:  RN, PT, OT, Nurse's Aide, Social Work [employer] Agency:  [facility]  Status of Service:  Completed, signed off  If discussed at [employer], dates discussed:    Additional Comments:  Golda Acre, RN 03/06/2018, 12:41 PM

## 2018-03-06 NOTE — Progress Notes (Signed)
Post void residual x 2  (220cc/270cc)

## 2018-03-06 NOTE — Discharge Summary (Addendum)
Physician Discharge Summary  Albert Salazar MVH:[id number] DOB: [birth year]  PCP: Albert Crease, MD  Admit date: 02/20/2018 Discharge date: 03/06/2018  Recommendations for Outpatient Follow-up:  1. Nicolasa Ducking, PCP in 1 week with repeat labs (CBC & CMP). 2. Dr. Jerilee Field, [facility] in 2 weeks for evaluation and management of urinary retention that required Foley catheter during discharge from hospital. 3. Recommend follow-up chest x-ray in 4 weeks to ensure resolution of pneumonia findings.  Home Health: PT, OT, RN, nurses aide and clinical social worker. Equipment/Devices: Rolling walker with 5 inch wheels.  Discharge Condition: Improved and stable CODE STATUS: Full. Diet recommendation: Heart healthy diet.  Diet consistency as per speech therapy recommendations as follows:   Diet recommendations: Dysphagia 3 (mechanical soft);Thin liquid Liquids provided via: Cup;Straw Medication Administration: Whole meds with puree Supervision: Full supervision/cueing for compensatory strategies Compensations: Small sips/bites;Slow rate;Minimize environmental distractions(intermittent dry swallow) Postural Changes and/or Swallow Maneuvers: Seated upright 90 degrees;Upright 30-60 min after meal  Discharge Diagnoses:  Principal Problem:   Alcohol withdrawal (HCC) Active Problems:   Alcohol use disorder, severe, dependence (HCC)   Cocaine use disorder, mild, abuse (HCC)   Chronic hepatitis C without hepatic coma (HCC)   Thrombocytopenia (HCC)   Alcohol intoxication (HCC)   Alcoholic cirrhosis of liver with ascites (HCC)   Aspiration pneumonia (HCC)   Bacteremia due to Gram-negative bacteria   Tachypnea   Brief Summary: 63 year old male with PMH of alcoholic and hepatitis C cirrhosis, polysubstance abuse, bipolar disorder, blind left eye, admitted by CCM to ICU on 02/20/2018 when he presented with a week history of groin pain, fever, chills.  He was admitted with a UTI, E.  coli bacteremia and developed alcohol withdrawal with tremor, severe agitated delirium for which he required Precedex.  Alcohol withdrawal has resolved.  Hospital course complicated by dysphagia and aspiration pneumonia.  Transferred to [facility] on 1/30  Significant hospital events  1/23 admitted with sepsis and alcohol withdrawal. Temperature documented as high as 107.7, however the not clear if this was an error did have chills and fever however. Cultures were obtained he was started on empiric antibiotics and IV hydration. seen by urology, CT scan revealed benign prostatic hypertrophy and right inguinal fluid collection was found to have a right hydrocele and felt to be not related to his presentation 1/24: Blood culture showing E. coli, hypotension resolved 1/25: Sedated on Precedex agitated easily 1/27: Remains on Precedex. Started librium 1/28: Add clonidine, continue Librium and try to taper Precedex off. Awaiting blood cultures as follow-up 1/29: Librium and clonidine continued. Precedex discontinued.   Assessment & Plan:  Alcohol withdrawal DTs: Initially admitted by CCM to ICU.  Treated with Precedex drip followed by Librium and clonidine taper-completed all and discontinued.  This has clinically resolved.  Continue thiamine, folate and multivitamins.  Alcohol abstinence was counseled.  Aspiration pneumonia 1/28: Due to dysphagia and altered mental status.  Initially treated with IV ceftriaxone and then transitioned to oral Augmentin.  Remains at risk due to dysphagia.  Strict aspiration precautions.  Needs follow-up chest x-ray in 4 weeks to ensure resolution of pneumonia findings.  E. coli bacteremia: Suspected due to UTI.  Completed ceftriaxone and now on Augmentin and completes 2/5 (14 days course).  Surveillance blood cultures from 1/27: Negative.  CV multiple antibiotics through this hospitalization (IV cefepime/metronidazole/vancomycin x1 dose on 1/23, IV ceftriaxone  1/24-1/30, IV Zosyn 1/30- 2/2 and Augmentin 2/3- 2/6.  Completed treatment.  Inguinal hydrocele/BPH: Seen by urology and no intervention  at this time.  CT abdomen showed prominent prostate extending into the bladder lumen which may simply be related to hypertrophy although further non-emergent work-up is recommended.  Can follow-up with urology as outpatient.  Non-anion gap metabolic acidosis: In the setting of hyperchloremia and hypomagnesemia: Resolved.  History of cirrhosis and hepatitis C: Periodically follow LFTs.  Thrombocytopenia: Platelet count was as low as 11.  Likely related to alcoholism.  Appears to be chronic with baseline in the 40s-70s dating back to 2017.  No bleeding reported.  This is improved to 29.  Follow CBCs periodically as outpatient.  Macrocytic anemia/anemia of chronic disease: Hemoglobin stable in the 9 g range.  Cocaine abuse  Tobacco abuse: Cessation counseled.  Nicotine patch.  Acute urinary retention: Foley catheter was placed this admission.  Flomax started.    Discontinued Foley catheter on 2/5 but ongoing acute urinary retention with positive bladder scan greater than 350 this morning.  Reinserted Foley catheter yielding 500 mL urine.  Patient will thereby be discharged on Foley catheter with close outpatient follow-up with urology.  Right upper extremity swelling: Venous Doppler negative for DVT.  Dysphagia: Continue dysphagia 3 diet as per SLP recommendations.   Disposition: SNF recommended by therapies but patient does not have insurance or SNF benefits.  Discussed with nephew on 36/5 and family will be able to provide 24/7 assistance upon discharge.    Discussed with case management and maximized home health services as noted above.   Consultants:  PCCM Urology PMT  Procedures:  Foley catheter discontinued 2/5 and and had to be placed back on 2/6   Discharge Instructions  Discharge Instructions    Call MD for:   Complete by:   As directed    Worsening confusion or mental status changes.   Call MD for:  difficulty breathing, headache or visual disturbances   Complete by:  As directed    Call MD for:  extreme fatigue   Complete by:  As directed    Call MD for:  persistant dizziness or light-headedness   Complete by:  As directed    Call MD for:  persistant nausea and vomiting   Complete by:  As directed    Call MD for:  severe uncontrolled pain   Complete by:  As directed    Call MD for:  temperature >100.4   Complete by:  As directed    Diet - low sodium heart healthy   Complete by:  As directed    Discharge instructions   Complete by:  As directed    Diet consistency as per speech therapy recommendations as follows:    Diet recommendations: Dysphagia 3 (mechanical soft);Thin liquid Liquids provided via: Cup;Straw Medication Administration: Whole meds with puree Supervision: Full supervision/cueing for compensatory strategies Compensations: Small sips/bites;Slow rate;Minimize environmental distractions(intermittent dry swallow) Postural Changes and/or Swallow Maneuvers: Seated upright 90 degrees;Upright 30-60 min after meal   Increase activity slowly   Complete by:  As directed        Medication List    TAKE these medications   folic acid 1 MG tablet Commonly known as:  FOLVITE Take 1 tablet (1 mg total) by mouth daily. Start taking on:  March 07, 2018   multivitamin with minerals Tabs tablet Take 1 tablet by mouth daily. Start taking on:  March 07, 2018   nicotine 21 mg/24hr patch Commonly known as:  NICODERM CQ - dosed in mg/24 hours Place 1 patch (21 mg total) onto the skin daily. Start taking on:  March 07, 2018   polyethylene glycol packet Commonly known as:  MIRALAX / GLYCOLAX Take 17 g by mouth daily. Start taking on:  March 07, 2018   tamsulosin 0.4 MG Caps capsule Commonly known as:  FLOMAX Take 1 capsule (0.4 mg total) by mouth daily. Start taking on:  March 07, 2018   thiamine 100 MG tablet Take 1 tablet (100 mg total) by mouth daily. Start taking on:  March 07, 2018      Follow-up Information    Pavelock, Duke Salvia, MD. Schedule an appointment as soon as possible for a visit in 1 week(s).   Specialty:  Internal Medicine Why:  To be seen with repeat labs (CBC & CMP). Contact information: [address] [place] [address] [phone]        [facility]. Schedule an appointment as soon as possible for a visit in 2 week(s).   Why:  Follow-up regarding evaluation of urinary catheter. Contact information: [address] [place] [address] [place] [address] [phone]         No Known Allergies    Procedures/Studies: Dg Chest 1 View  Result Date: 02/25/2018 CLINICAL DATA:  63 year old man with cirrhosis due to alcohol abuse and chronic hep C, also bipolar disease. He is admitted with a urinary tract infection and E. coli bacteremia. Course unfortunately complicated by agitated delirium and alcohol withdrawal. He was moved to the ICU and has been on Precedex. Tachypnea. EXAM: CHEST  1 VIEW COMPARISON:  02/20/2018 FINDINGS: There is new airspace opacification in the right lung most prominently in the right upper lobe. More hazy opacities noted at the right lung base. There is also opacity at the left lung base with the remainder of the left lung clear. Cardiac silhouette is normal in size.  No mediastinal or masses. No convincing pneumothorax on this semi-erect study. IMPRESSION: 1. New airspace opacities primarily in the right upper lobe. Given the history, suspect aspiration pneumonitis. 2. There are additional bilateral lung base opacities which may reflect additional infection/inflammation or be due to small effusions with atelectasis. Electronically Signed   By: Amie Portland M.D.   On: 02/25/2018 19:14   Dg Chest 2 View  Result Date: 03/03/2018 CLINICAL DATA:  Shortness of breath EXAM: CHEST - 2  VIEW COMPARISON:  February 28, 2017 and December 23, 2015 FINDINGS: There is a persistent left pleural effusion. There is consolidation in a portion of the left base. There is mild cardiomegaly with pulmonary venous hypertension. There is a small right pleural effusion. No evident adenopathy. No bone lesions. IMPRESSION: Pulmonary vascular congestion with pleural effusions bilaterally, larger on the left than on the right. Suspect superimposed pneumonia left base. Appearance similar to recent prior study. Electronically Signed   By: Bretta Bang III M.D.   On: 03/03/2018 08:33   Ct Abdomen Pelvis W Contrast  Result Date: 02/20/2018 CLINICAL DATA:  Right groin palpable abnormality for 1 week, initial encounter EXAM: CT ABDOMEN AND PELVIS WITH CONTRAST TECHNIQUE: Multidetector CT imaging of the abdomen and pelvis was performed using the standard protocol following bolus administration of intravenous contrast. CONTRAST:  ISOVUE-300 IOPAMIDOL (ISOVUE-300) INJECTION 61% COMPARISON:  07/22/2014 FINDINGS: Lower chest: No acute abnormality. Hepatobiliary: No focal liver abnormality is seen. No gallstones, gallbladder wall thickening, or biliary dilatation. Pancreas: Unremarkable. No pancreatic ductal dilatation or surrounding inflammatory changes. Spleen: Normal in size without focal abnormality. Adrenals/Urinary Tract: Adrenal glands are within normal limits. Kidneys are well visualized  bilaterally. Normal enhancement and delayed excretion is seen. No obstructive changes are noted. The bladder is well distended. Stomach/Bowel: Colon is decompressed. No obstructive changes are seen. The appendix is well visualized and within normal limits. No inflammatory changes are seen. Few mildly prominent loops of small bowel are noted although no definitive obstruction is seen. The stomach is within normal limits. Vascular/Lymphatic: Aortic atherosclerosis. No enlarged abdominal or pelvic lymph nodes. Reproductive: The  prostate is enlarged with a significant amount of extension into the bladder lumen. This may simply represent benign hypertrophy although further nonemergent workup is recommended. Other: No free fluid is noted within the abdomen or pelvis. No umbilical hernia is seen. An ovoid fluid collection is noted within the right inguinal canal extending into the scrotum. This measures 3.5 x 3.6 cm in greatest transverse and AP dimensions and extends for approximately 9.4 cm in craniocaudad projection. This may represent superior extension of a large hydrocele. This is not related to bowel herniation. Musculoskeletal: Mild degenerative changes of the lumbar spine are seen. IMPRESSION: Ovoid fluid collection within the right inguinal canal extending into the scrotum consistent with the given clinical history. This likely represents a large hydrocele extending superiorly in the inguinal canal. No hernia is noted in this region. Normal-appearing appendix. Prominent prostate extending into the bladder lumen. This may simply be related to hypertrophy although further nonemergent workup is recommended. Electronically Signed   By: Alcide Clever M.D.   On: 02/20/2018 14:45   Dg Chest Port 1 View  Result Date: 02/28/2018 CLINICAL DATA:  Dyspnea EXAM: PORTABLE CHEST 1 VIEW COMPARISON:  02/25/2018 FINDINGS: Layering bilateral pleural effusions are again noted left slightly greater than right. Clearing of airspace opacity in the right upper lobe since prior. No new pulmonary consolidation is identified. Heart size is top-normal. There is aortic atherosclerosis at the arch without aneurysm. Mild vascular congestion persists. No acute osseous abnormality. IMPRESSION: Stigmata of mild CHF with stable cardiomegaly, vascular congestion and left greater than right layering small pleural effusions. Interval clearing of previously noted right upper lobe airspace disease may have represented stigmata of CHF as opposed to pneumonia.  Electronically Signed   By: Tollie Eth M.D.   On: 02/28/2018 19:03   Dg Chest Portable 1 View  Result Date: 02/20/2018 CLINICAL DATA:  Testicular pain for a week.  Polysubstance abuse. EXAM: PORTABLE CHEST 1 VIEW COMPARISON:  December 23, 2015 FINDINGS: The heart, hila, mediastinum, and pleura are normal. No convincing infiltrate. IMPRESSION: No active disease. Electronically Signed   By: Gerome Sam III M.D   On: 02/20/2018 16:42   Dg Swallowing Func-speech Pathology  Result Date: 02/27/2018 Objective Swallowing Evaluation: Type of Study: MBS-Modified Barium Swallow Study  Patient Details Name: Albert Salazar MRN: [id number] Date of Birth: 1955-12-21 Today's Date: 02/27/2018 Time: SLP Start Time (ACUTE ONLY): 0810 -SLP Stop Time (ACUTE ONLY): 0835 SLP Time Calculation (min) (ACUTE ONLY): 25 min Past Medical History: Past Medical History: Diagnosis Date . Bipolar disorder (HCC)  . Blindness of left eye  . Cirrhosis of liver (HCC)  . Depression  . Hepatitis C  . Polysubstance abuse [facility])  Past Surgical History: Past Surgical History: Procedure Laterality Date . EYE SURGERY   HPI: 63 y.o. male admitted with alcohol withdrawal/DTs, sepsis, inguinal hydrocele, hx of cirrhosis and bipolar disorder.  Per nurse, noted to be coughing while eating, and CT showed new airspace opacities primarily in the right upper lobe. Therefore swallow evaluation was re-ordered.  Subjective: pt awake in bed Assessment /  Plan / Recommendation CHL IP CLINICAL IMPRESSIONS 02/27/2018 Clinical Impression Patient presents with mild oropharyngeal dysphagia without aspiration.  Decreased oral coordination resulted in premature spillage of oral residuals.  Trace laryngeal penetration of thin observed intermittently with thin.  Mild delay in mastication of solids observed.  Pt did not transit barium tablet (adhered to mid tongue) despite trials of thin and then pudding to help clear.  He expectorated per cue.  Intermittent minimal pharyngeal  residuals of liquids more than solids noted that mixed with secretions.   Cued dry swallow helpful to clear residuals.  Pt did cough during mbs when no barium visualized in larynx/trachea.  In addition he was belching thus recommend reflux precautions.  Due to oral deficits being primary and poor dentition recommend dys3/thin diet with full supervision and strict precautions.  SlP will follow up for tolerance and indication for swallow treatment.  Pt educated to findings/recommendations using teach back and flouro loops.   SLP Visit Diagnosis Dysphagia, oropharyngeal phase (R13.12) Attention and concentration deficit following -- Frontal lobe and executive function deficit following -- Impact on safety and function Mild aspiration risk   CHL IP TREATMENT RECOMMENDATION 02/27/2018 Treatment Recommendations Therapy as outlined in treatment plan below   Prognosis 02/27/2018 Prognosis for Safe Diet Advancement Good Barriers to Reach Goals -- Barriers/Prognosis Comment -- CHL IP DIET RECOMMENDATION 02/27/2018 SLP Diet Recommendations Dysphagia 3 (Mech soft) solids;Thin liquid Liquid Administration via Cup;Straw Medication Administration Crushed with puree Compensations Slow rate;Small sips/bites Postural Changes Remain semi-upright after after feeds/meals (Comment);Seated upright at 90 degrees   CHL IP OTHER RECOMMENDATIONS 02/27/2018 Recommended Consults -- Oral Care Recommendations Oral care BID Other Recommendations --   CHL IP FOLLOW UP RECOMMENDATIONS 02/27/2018 Follow up Recommendations (No Data)   CHL IP FREQUENCY AND DURATION 02/27/2018 Speech Therapy Frequency (ACUTE ONLY) min 2x/week Treatment Duration 1 week      CHL IP ORAL PHASE 02/27/2018 Oral Phase Impaired Oral - Pudding Teaspoon -- Oral - Pudding Cup -- Oral - Honey Teaspoon -- Oral - Honey Cup -- Oral - Nectar Teaspoon Decreased bolus cohesion;Weak lingual manipulation Oral - Nectar Cup -- Oral - Nectar Straw Decreased bolus cohesion;Weak lingual manipulation  Oral - Thin Teaspoon Decreased bolus cohesion;Weak lingual manipulation Oral - Thin Cup Decreased bolus cohesion;Weak lingual manipulation Oral - Thin Straw Decreased bolus cohesion;Weak lingual manipulation Oral - Puree Decreased bolus cohesion;Weak lingual manipulation Oral - Mech Soft Decreased bolus cohesion;Weak lingual manipulation Oral - Regular -- Oral - Multi-Consistency -- Oral - Pill Decreased bolus cohesion;Weak lingual manipulation Oral Phase - Comment --  CHL IP PHARYNGEAL PHASE 02/27/2018 Pharyngeal Phase Impaired Pharyngeal- Pudding Teaspoon -- Pharyngeal -- Pharyngeal- Pudding Cup -- Pharyngeal -- Pharyngeal- Honey Teaspoon -- Pharyngeal -- Pharyngeal- Honey Cup -- Pharyngeal -- Pharyngeal- Nectar Teaspoon -- Pharyngeal -- Pharyngeal- Nectar Cup Pharyngeal residue - valleculae;Delayed swallow initiation-vallecula;Pharyngeal residue - pyriform Pharyngeal -- Pharyngeal- Nectar Straw -- Pharyngeal -- Pharyngeal- Thin Teaspoon -- Pharyngeal -- Pharyngeal- Thin Cup Pharyngeal residue - valleculae;Delayed swallow initiation-pyriform sinuses;Pharyngeal residue - pyriform;Penetration/Aspiration during swallow Pharyngeal Material enters airway, remains ABOVE vocal cords and not ejected out Pharyngeal- Thin Straw Delayed swallow initiation-pyriform sinuses;Reduced airway/laryngeal closure;Reduced tongue base retraction;Penetration/Aspiration during swallow Pharyngeal Material enters airway, remains ABOVE vocal cords and not ejected out Pharyngeal- Puree Delayed swallow initiation-vallecula Pharyngeal Material does not enter airway Pharyngeal- Mechanical Soft -- Pharyngeal -- Pharyngeal- Regular Delayed swallow initiation-vallecula Pharyngeal -- Pharyngeal- Multi-consistency -- Pharyngeal -- Pharyngeal- Pill NT Pharyngeal -- Pharyngeal Comment --  CHL IP CERVICAL ESOPHAGEAL PHASE  02/27/2018 Cervical Esophageal Phase WFL Pudding Teaspoon -- Pudding Cup -- Honey Teaspoon -- Honey Cup -- Nectar Teaspoon -- Nectar  Cup -- Nectar Straw -- Thin Teaspoon -- Thin Cup -- Thin Straw -- Puree -- Mechanical Soft -- Regular -- Multi-consistency -- Pill -- Cervical Esophageal Comment -- Chales AbrahamsKimball, Tamara Ann 02/27/2018, 11:49 AM  Donavan Burnetamara Kimball, MS [facility] SLP Acute Rehab Services Pager [phone] Office [phone]             [place] Upper Extremity Venous Duplex  Result Date: 02/23/2018 UPPER VENOUS STUDY  Indications: Swelling Performing Technologist: Levada Schillingharlotte Bynum RDMS, RVT  Examination Guidelines: A complete evaluation includes B-mode imaging, spectral Doppler, color Doppler, and power Doppler as needed of all accessible portions of each vessel. Bilateral testing is considered an integral part of a complete examination. Limited examinations for reoccurring indications may be performed as noted.  Right Findings: +----------+------------+----------+---------+-----------+-------+ RIGHT     CompressiblePropertiesPhasicitySpontaneousSummary +----------+------------+----------+---------+-----------+-------+ IJV           Full                 Yes       Yes            +----------+------------+----------+---------+-----------+-------+ Subclavian                         Yes       Yes            +----------+------------+----------+---------+-----------+-------+ Axillary      Full                 Yes       Yes            +----------+------------+----------+---------+-----------+-------+ Brachial      Full                 Yes                      +----------+------------+----------+---------+-----------+-------+ Radial        Full                                          +----------+------------+----------+---------+-----------+-------+ Ulnar         Full                                          +----------+------------+----------+---------+-----------+-------+ Cephalic      Full                 Yes                      +----------+------------+----------+---------+-----------+-------+  Basilic       Full                 Yes                      +----------+------------+----------+---------+-----------+-------+  Left Findings: +----------+------------+----------+---------+-----------+-------+ LEFT      CompressiblePropertiesPhasicitySpontaneousSummary +----------+------------+----------+---------+-----------+-------+ Subclavian                         Yes       Yes            +----------+------------+----------+---------+-----------+-------+  Summary:  Right: No evidence of deep vein thrombosis in the upper extremity. No evidence of superficial vein thrombosis in the upper extremity. No evidence of thrombosis in the subclavian.  Left: No evidence of thrombosis in the subclavian.  *See table(s) above for measurements and observations.  Diagnosing physician: Lemar Livings MD Electronically signed by Lemar Livings MD on 02/23/2018 at 3:07:33 PM.    Final       Subjective: Patient denies complaints.  No pain reported.  Ongoing difficulty with urination after Foley catheter was removed.  No cough, chest pain or dyspnea.  As per RN, no other acute issues noted.  Discharge Exam:  Vitals:   03/05/18 0639 03/05/18 1343 03/05/18 2059 03/06/18 0607  BP: (!) 151/81 140/74 (!) 143/67   Pulse: 83 75 76 82  Resp: 16  15 17   Temp: 98.1 F (36.7 C) 99.3 F (37.4 C) 98.8 F (37.1 C) 98.9 F (37.2 C)  TempSrc: Oral Oral Oral Oral  SpO2: 95% 93% 97% 96%  Weight:    87.5 kg  Height:        General exam: Pleasant middle-age male, moderately built and nourished lying comfortably propped up in bed.  Oral mucosa moist. Respiratory system: Occasional basal crackles but otherwise clear to auscultation.  No increased work of breathing. Cardiovascular system: S1 & S2 heard, RRR. No JVD, murmurs, rubs, gallops or clicks. No pedal edema. Gastrointestinal system: Abdomen is nondistended, soft and nontender. No organomegaly or masses felt. Normal bowel sounds heard. Central nervous  system: Alert and oriented to person and place. No focal neurological deficits. Extremities: Symmetric 5 x 5 power. Skin: No rashes, lesions or ulcers Psychiatry: Judgement and insight impaired. Mood & affect appropriate. GU: Mild scrotal and penile edema without acute findings.    The results of significant diagnostics from this hospitalization (including imaging, microbiology, ancillary and laboratory) are listed below for reference.      Labs: CBC: Recent Labs  Lab 03/02/18 0532 03/03/18 0531 03/04/18 0752 03/05/18 0621 03/06/18 0605  WBC 7.6 6.6 6.7 6.4 6.2  NEUTROABS 5.0 4.3 4.4  --   --   HGB 9.3* 9.9* 9.6* 9.6* 10.1*  HCT 30.2* 32.2* 30.5* 30.5* 31.6*  MCV 109.0* 108.4* 109.3* 108.9* 106.8*  PLT 13* 14* 20* 25* 29*   Basic Metabolic Panel: Recent Labs  Lab 02/28/18 0605 03/01/18 0530 03/02/18 0532 03/03/18 0531  NA 139 139 136 134*  K 3.6 4.1 3.8 3.6  CL 110 105 103 104  CO2 23 28 28 26   GLUCOSE 87 89 121* 114*  BUN 11 13 12 11   CREATININE 0.96 0.92 0.91 0.75  CALCIUM 7.5* 7.8* 7.6* 7.7*   Liver Function Tests: Recent Labs  Lab 03/01/18 0530 03/03/18 0531  AST 63* 65*  ALT 32 35  ALKPHOS 52 62  BILITOT 1.7* 0.9  PROT 5.9* 5.5*  ALBUMIN 1.7* 1.5*   Urinalysis    Component Value Date/Time   COLORURINE AMBER (A) 06/13/2014 1440   APPEARANCEUR CLEAR 06/13/2014 1440   LABSPEC 1.026 06/13/2014 1440   PHURINE 5.5 06/13/2014 1440   GLUCOSEU NEGATIVE 06/13/2014 1440   HGBUR NEGATIVE 06/13/2014 1440   BILIRUBINUR SMALL (A) 06/13/2014 1440   BILIRUBINUR neg 04/21/2014 0959   KETONESUR 15 (A) 06/13/2014 1440   PROTEINUR NEGATIVE 06/13/2014 1440   UROBILINOGEN 1.0 06/13/2014 1440   NITRITE NEGATIVE 06/13/2014 1440   LEUKOCYTESUR NEGATIVE 06/13/2014 1440      Time coordinating discharge: 40 minutes  SIGNED:  Marcellus Scott, MD, FACP, [facility].  [facility]  To contact the attending provider between 7A-7P or the covering provider during  after hours 7P-7A, please log into the web site [email or web address] and access using universal [place] password for that web site. If you do not have the password, please call the hospital operator.

## 2018-03-06 NOTE — Discharge Instructions (Signed)

## 2018-03-07 LAB — CBC WITH DIFFERENTIAL/PLATELET
ABS IMMATURE GRANULOCYTES: 0.04 10*3/uL (ref 0.00–0.07)
Basophils Absolute: 0 10*3/uL (ref 0.0–0.1)
Basophils Relative: 0 %
Eosinophils Absolute: 0 10*3/uL (ref 0.0–0.5)
Eosinophils Relative: 0 %
HCT: 30.5 % — ABNORMAL LOW (ref 39.0–52.0)
Hemoglobin: 9.6 g/dL — ABNORMAL LOW (ref 13.0–17.0)
IMMATURE GRANULOCYTES: 1 %
Lymphocytes Relative: 24 %
Lymphs Abs: 1.6 10*3/uL (ref 0.7–4.0)
MCH: 34.4 pg — ABNORMAL HIGH (ref 26.0–34.0)
MCHC: 31.5 g/dL (ref 30.0–36.0)
MCV: 109.3 fL — ABNORMAL HIGH (ref 80.0–100.0)
Monocytes Absolute: 0.6 10*3/uL (ref 0.1–1.0)
Monocytes Relative: 8 %
NEUTROS PCT: 67 %
Neutro Abs: 4.4 10*3/uL (ref 1.7–7.7)
Platelets: 20 10*3/uL — CL (ref 150–400)
RBC: 2.79 MIL/uL — ABNORMAL LOW (ref 4.22–5.81)
RDW: 14.9 % (ref 11.5–15.5)
WBC: 6.7 10*3/uL (ref 4.0–10.5)
nRBC: 0 % (ref 0.0–0.2)

## 2018-03-08 ENCOUNTER — Encounter (HOSPITAL_COMMUNITY): Payer: Self-pay | Admitting: Emergency Medicine

## 2018-03-08 ENCOUNTER — Emergency Department (HOSPITAL_COMMUNITY)
Admission: EM | Admit: 2018-03-08 | Discharge: 2018-03-08 | Disposition: A | Discharge status: Home / Self Care | Payer: Medicaid Other | Attending: Emergency Medicine | Admitting: Emergency Medicine

## 2018-03-08 DIAGNOSIS — F1410 Cocaine abuse, uncomplicated: Secondary | ICD-10-CM | POA: Insufficient documentation

## 2018-03-08 DIAGNOSIS — Y828 Other medical devices associated with adverse incidents: Secondary | ICD-10-CM | POA: Diagnosis not present

## 2018-03-08 DIAGNOSIS — T839XXA Unspecified complication of genitourinary prosthetic device, implant and graft, initial encounter: Secondary | ICD-10-CM

## 2018-03-08 DIAGNOSIS — T83091A Other mechanical complication of indwelling urethral catheter, initial encounter: Secondary | ICD-10-CM | POA: Diagnosis present

## 2018-03-08 DIAGNOSIS — F17210 Nicotine dependence, cigarettes, uncomplicated: Secondary | ICD-10-CM | POA: Insufficient documentation

## 2018-03-08 DIAGNOSIS — F1010 Alcohol abuse, uncomplicated: Secondary | ICD-10-CM | POA: Diagnosis not present

## 2018-03-08 DIAGNOSIS — F319 Bipolar disorder, unspecified: Secondary | ICD-10-CM | POA: Diagnosis not present

## 2018-03-08 NOTE — ED Provider Notes (Signed)
[facility]-EMERGENCY DEPT Provider Note   CSN: [id number] Arrival date & time: 03/08/18  1225     History   Chief Complaint Chief Complaint  Patient presents with  . catheter pain    HPI Albert Salazar is a 63 y.o. male.  Patient discharged on the hospital from the hospital on February 6.  After complicated stay that included sepsis.  Was in the hospital for about a week.  Shortly prior to discharge patient had difficulty voiding after having his Foley catheter removed.  This was reinserted.  But he only had 500 cc of urine with a reinserted shortly prior to discharge.  He was to keep it in place and follow-up with urology as an outpatient which is coming up next week.  Patient returns today says that he does not want it in that causes too much discomfort.  Has had no bleeding.  Good urine is coming out.  He says we will take it out he will take it out.     Past Medical History:  Diagnosis Date  . Bipolar disorder (HCC)   . Blindness of left eye   . Cirrhosis of liver (HCC)   . Depression   . Hepatitis C   . Polysubstance abuse [facility](HCC)     Patient Active Problem List   Diagnosis Date Noted  . Aspiration pneumonia (HCC) 02/27/2018  . Bacteremia due to Gram-negative bacteria 02/27/2018  . Tachypnea   . Alcohol withdrawal (HCC) 02/20/2018  . Thrombocytopenia (HCC) 06/13/2014  . Alcohol intoxication (HCC) 06/13/2014  . Normocytic anemia 06/13/2014  . Peripheral edema 06/13/2014  . Left epididymitis 06/13/2014  . Dysuria 06/13/2014  . Alcohol abuse   . Alcoholic cirrhosis of liver with ascites (HCC)   . Chronic hepatitis C without hepatic coma (HCC) 06/07/2014  . Hepatic cirrhosis (HCC) 06/07/2014  . Elevated BP 04/21/2014  . Alcohol use disorder, severe, dependence (HCC) 12/30/2013  . Cocaine use disorder, mild, abuse (HCC) 12/30/2013  . Substance induced mood disorder (HCC) 12/30/2013    Past Surgical History:  Procedure Laterality Date  . EYE  SURGERY          Home Medications    Prior to Admission medications   Medication Sig Start Date End Date Taking? Authorizing Provider  folic acid (FOLVITE) 1 MG tablet Take 1 tablet (1 mg total) by mouth daily. 03/07/18   Hongalgi, Maximino GreenlandAnand D, MD  Multiple Vitamin (MULTIVITAMIN WITH MINERALS) TABS tablet Take 1 tablet by mouth daily. 03/07/18   Hongalgi, Maximino GreenlandAnand D, MD  nicotine (NICODERM CQ - DOSED IN MG/24 HOURS) 21 mg/24hr patch Place 1 patch (21 mg total) onto the skin daily. 03/07/18   Hongalgi, Maximino GreenlandAnand D, MD  polyethylene glycol (MIRALAX / GLYCOLAX) packet Take 17 g by mouth daily. 03/07/18   Hongalgi, Maximino GreenlandAnand D, MD  tamsulosin (FLOMAX) 0.4 MG CAPS capsule Take 1 capsule (0.4 mg total) by mouth daily. 03/07/18   Hongalgi, Maximino GreenlandAnand D, MD  thiamine 100 MG tablet Take 1 tablet (100 mg total) by mouth daily. 03/07/18   Hongalgi, Maximino GreenlandAnand D, MD    Family History Family History  Problem Relation Age of Onset  . Diabetes Brother     Social History Social History   Tobacco Use  . Smoking status: Current Every Day Smoker    Packs/day: 1.00    Years: 40.00    Pack years: 40.00    Types: Cigarettes  . Smokeless tobacco: Never Used  Substance Use Topics  . Alcohol use: Yes  Alcohol/week: 8.0 standard drinks    Types: 8 Cans of beer per week  . Drug use: Yes    Types: IV, Cocaine     Allergies   Patient has no known allergies.   Review of Systems Review of Systems  Constitutional: Negative for chills and fever.  HENT: Negative for ear pain and sore throat.   Eyes: Negative for pain and visual disturbance.  Respiratory: Negative for cough and shortness of breath.   Cardiovascular: Negative for chest pain and palpitations.  Gastrointestinal: Negative for abdominal pain and vomiting.  Genitourinary: Positive for penile pain. Negative for discharge, dysuria, hematuria and scrotal swelling.  Musculoskeletal: Negative for arthralgias and back pain.  Skin: Negative for color change and rash.    Neurological: Negative for seizures and syncope.  All other systems reviewed and are negative.    Physical Exam Updated Vital Signs BP 112/77 (BP Location: Left Arm)   Pulse (!) 51   Temp 97.7 F (36.5 C) (Oral)   Resp 20   SpO2 98%   Physical Exam Vitals signs and nursing note reviewed.  Constitutional:      Appearance: Normal appearance. He is well-developed.  HENT:     Head: Normocephalic and atraumatic.     Mouth/Throat:     Mouth: Mucous membranes are moist.  Eyes:     Extraocular Movements: Extraocular movements intact.     Conjunctiva/sclera: Conjunctivae normal.     Pupils: Pupils are equal, round, and reactive to light.     Comments: Left eye with cornea whited out no vision in that eye.  Neck:     Musculoskeletal: Neck supple.  Cardiovascular:     Rate and Rhythm: Normal rate and regular rhythm.     Heart sounds: No murmur.  Pulmonary:     Effort: Pulmonary effort is normal. No respiratory distress.     Breath sounds: Normal breath sounds.  Abdominal:     Palpations: Abdomen is soft.     Tenderness: There is no abdominal tenderness.  Genitourinary:    Penis: Normal.      Comments: Foley catheter in place.  Good amount of urine in the leg bag.  Patient uncircumcised. Musculoskeletal: Normal range of motion.  Skin:    General: Skin is warm and dry.  Neurological:     General: No focal deficit present.     Mental Status: He is alert and oriented to person, place, and time.     Comments: No vision left eye.      ED Treatments / Results  Labs (all labs ordered are listed, but only abnormal results are displayed) Labs Reviewed - No data to display  EKG None  Radiology No results found.  Procedures Procedures (including critical care time)  Medications Ordered in ED Medications - No data to display   Initial Impression / Assessment and Plan / ED Course  I have reviewed the triage vital signs and the nursing notes.  Pertinent labs & imaging  results that were available during my care of the patient were reviewed by me and considered in my medical decision making (see chart for details).    Patient insisting on having Foley catheter removed.  He says it is too irritating.  It does appear patient had urinary retention during his recent hospitalization was just discharged on February 6.  He was unable to void when they removed his initial catheter.  Then on repeat he only had about 500 cc retention.  So he certainly was not  over distended.  Told patient we recommend keeping it until he sees urology but he insists that he wants it out so we will go and take it out still he will return if he is unable to void.   Final Clinical Impressions(s) / ED Diagnoses   Final diagnoses:  Foley catheter problem, initial encounter [facility])    ED Discharge Orders    None       Vanetta Mulders, MD 03/08/18 [phone]

## 2018-03-08 NOTE — Discharge Instructions (Addendum)
Return if unable to urinate overnight.  As we discussed the recommendation would have been to keep the catheter in but understanding insisted on having it removed.  If unable to urinate must return.  Will have to have a catheter replaced.

## 2018-03-08 NOTE — ED Triage Notes (Signed)
Pt reports that had foley catheter placed while was admitted to hospital and is having pain and wants it out. Reports has been draining fine.

## 2018-03-09 NOTE — Progress Notes (Signed)
03/09/2018 CSW informed by charge RN that patient was left in lobby overnight. Charge RN reported that patient's son was called multiple times and agreed to pick up patient and never showed up. Charge RN requested that CSW follow up with patient.  CSW followed up with patient in triage. Patient was oriented x 4 and verbalized plan to return home. Patient reported that his son was working and that he has a key to get into his home. Patient reported that his son would probably be home soon and that he also has neighbors that he could call if he needed something. CSW asked patient if he felt safe returning home, patient replied yes. CSW asked patient if he would be able to get into and out of cab without assistance, patient replied yes. CSW agreed to provide patient with a taxi voucher to return home, patient verified his address.  CSW followed up with nurse tech that had been assisting patient, who reported that patient has been ambulating with and without walker. Nurse tech reported that patient should be able to get in and out a taxi with no assistance.  CSW contacted [employer] to schedule taxi and was interrupted with a notification that patient's son had arrived, [place] cancelled taxi. Patient's son reported that he wanted to speak with CSW and didn't want patient to know that he had arrived.   CSW met with patient's son and male accompanied by patient's son. Patient's son reported that he had questions about rehab. Patient's son reported that patient was inpatient last week and they recommended rehab for patient but told him that patient was not able to go because he didn't have insurance. Patient's son reported that patient does have insurance, Medicaid. CSW informed patient's son that medicaid does not pay for rehab at [facility] and explained rehab vs [facility] term care and payor sources. Patient's son asked if patient still needed rehab, CSW informed patient's son that CSW was not able to answer that question. CSW  informed patient's son that the need for rehab is determined by a physical therapy evaluation. CSW agreed to ask patient's permission to speak with patient's son about his medical information.  CSW informed patient that his son was present and asked patient's permission to speak with his son Albert Salazar about his medical information. Patient granted CSW verbal permission to speak with his son about his medical information.   CSW informed patient's son did not receive a physical therapy evaluation during his admission and CSW was unaware of patient's rehab needs. CSW asked patient's son was any home health services set up, patient's son reported yes and that they haven't heard from anyone. Patient's son was unable to recall the name of the agency. CSW encouraged patient's son to follow up with home health agency about services needed for patient. Patient's son reported if patient is ready for discharge that he will take patient home. CSW informed patient's son that patient is ready for discharge.   Patient discharged home with son Albert Salazar. CSW signing off.  Abundio Miu, [place] Worker [facility]  Cell#: [phone]

## 2018-05-27 ENCOUNTER — Encounter (HOSPITAL_COMMUNITY): Payer: Self-pay

## 2018-05-27 ENCOUNTER — Emergency Department (HOSPITAL_COMMUNITY)
Admission: EM | Admit: 2018-05-27 | Discharge: 2018-05-28 | Disposition: A | Discharge status: Home / Self Care | Payer: Medicaid Other | Attending: Internal Medicine | Admitting: Internal Medicine

## 2018-05-27 ENCOUNTER — Emergency Department (HOSPITAL_COMMUNITY): Payer: Medicaid Other

## 2018-05-27 ENCOUNTER — Other Ambulatory Visit: Payer: Self-pay

## 2018-05-27 DIAGNOSIS — I498 Other specified cardiac arrhythmias: Secondary | ICD-10-CM

## 2018-05-27 DIAGNOSIS — F1410 Cocaine abuse, uncomplicated: Secondary | ICD-10-CM | POA: Diagnosis not present

## 2018-05-27 DIAGNOSIS — R008 Other abnormalities of heart beat: Secondary | ICD-10-CM | POA: Insufficient documentation

## 2018-05-27 DIAGNOSIS — K7031 Alcoholic cirrhosis of liver with ascites: Secondary | ICD-10-CM | POA: Insufficient documentation

## 2018-05-27 DIAGNOSIS — F17210 Nicotine dependence, cigarettes, uncomplicated: Secondary | ICD-10-CM | POA: Diagnosis not present

## 2018-05-27 DIAGNOSIS — F1010 Alcohol abuse, uncomplicated: Secondary | ICD-10-CM | POA: Diagnosis not present

## 2018-05-27 DIAGNOSIS — I499 Cardiac arrhythmia, unspecified: Secondary | ICD-10-CM

## 2018-05-27 DIAGNOSIS — F319 Bipolar disorder, unspecified: Secondary | ICD-10-CM | POA: Diagnosis not present

## 2018-05-27 DIAGNOSIS — Z79899 Other long term (current) drug therapy: Secondary | ICD-10-CM | POA: Diagnosis not present

## 2018-05-27 DIAGNOSIS — R339 Retention of urine, unspecified: Secondary | ICD-10-CM

## 2018-05-27 DIAGNOSIS — N390 Urinary tract infection, site not specified: Secondary | ICD-10-CM

## 2018-05-27 DIAGNOSIS — R1031 Right lower quadrant pain: Secondary | ICD-10-CM | POA: Diagnosis present

## 2018-05-27 LAB — CBC WITH DIFFERENTIAL/PLATELET
Abs Immature Granulocytes: 0.02 10*3/uL (ref 0.00–0.07)
Basophils Absolute: 0 10*3/uL (ref 0.0–0.1)
Basophils Relative: 0 %
Eosinophils Absolute: 0 10*3/uL (ref 0.0–0.5)
Eosinophils Relative: 0 %
HCT: 34.8 % — ABNORMAL LOW (ref 39.0–52.0)
Hemoglobin: 11.2 g/dL — ABNORMAL LOW (ref 13.0–17.0)
Immature Granulocytes: 0 %
Lymphocytes Relative: 43 %
Lymphs Abs: 2.6 10*3/uL (ref 0.7–4.0)
MCH: 31.6 pg (ref 26.0–34.0)
MCHC: 32.2 g/dL (ref 30.0–36.0)
MCV: 98.3 fL (ref 80.0–100.0)
Monocytes Absolute: 0.5 10*3/uL (ref 0.1–1.0)
Monocytes Relative: 8 %
Neutro Abs: 2.9 10*3/uL (ref 1.7–7.7)
Neutrophils Relative %: 49 %
Platelets: 45 10*3/uL — ABNORMAL LOW (ref 150–400)
RBC: 3.54 MIL/uL — ABNORMAL LOW (ref 4.22–5.81)
RDW: 16.8 % — ABNORMAL HIGH (ref 11.5–15.5)
WBC: 6.1 10*3/uL (ref 4.0–10.5)
nRBC: 0 % (ref 0.0–0.2)

## 2018-05-27 LAB — BASIC METABOLIC PANEL
Anion gap: 7 (ref 5–15)
BUN: 9 mg/dL (ref 8–23)
CO2: 24 mmol/L (ref 22–32)
Calcium: 8.1 mg/dL — ABNORMAL LOW (ref 8.9–10.3)
Chloride: 107 mmol/L (ref 98–111)
Creatinine, Ser: 0.83 mg/dL (ref 0.61–1.24)
GFR calc Af Amer: >60 mL/min (ref >60)
GFR calc non Af Amer: >60 mL/min (ref >60)
Glucose, Bld: 111 mg/dL — ABNORMAL HIGH (ref 70–99)
Potassium: 3.7 mmol/L (ref 3.5–5.1)
Sodium: 138 mmol/L (ref 135–145)

## 2018-05-27 LAB — URINALYSIS, ROUTINE W REFLEX MICROSCOPIC
Bilirubin Urine: NEGATIVE
Glucose, UA: NEGATIVE mg/dL
Ketones, ur: 5 mg/dL — AB
Nitrite: POSITIVE — AB
Protein, ur: 30 mg/dL — AB
Specific Gravity, Urine: 1.024 (ref 1.005–1.030)
WBC, UA: >50 WBC/hpf — ABNORMAL HIGH (ref 0–5)
pH: 5 (ref 5.0–8.0)

## 2018-05-27 LAB — PROTIME-INR
INR: 1.3 — ABNORMAL HIGH (ref 0.8–1.2)
Prothrombin Time: 16.2 seconds — ABNORMAL HIGH (ref 11.4–15.2)

## 2018-05-27 LAB — HEPATIC FUNCTION PANEL
ALT: 14 U/L (ref 0–44)
AST: 24 U/L (ref 15–41)
Albumin: 2.4 g/dL — ABNORMAL LOW (ref 3.5–5.0)
Alkaline Phosphatase: 92 U/L (ref 38–126)
Bilirubin, Direct: 0.6 mg/dL — ABNORMAL HIGH (ref 0.0–0.2)
Indirect Bilirubin: 1.2 mg/dL — ABNORMAL HIGH (ref 0.3–0.9)
Total Bilirubin: 1.8 mg/dL — ABNORMAL HIGH (ref 0.3–1.2)
Total Protein: 7 g/dL (ref 6.5–8.1)

## 2018-05-27 LAB — TYPE AND SCREEN
ABO/RH(D): O POS
Antibody Screen: NEGATIVE

## 2018-05-27 LAB — LACTIC ACID, PLASMA: Lactic Acid, Venous: 1.3 mmol/L (ref 0.5–1.9)

## 2018-05-27 LAB — APTT: aPTT: 33 seconds (ref 24–36)

## 2018-05-27 MED ORDER — SODIUM CHLORIDE 0.9 % IV SOLN
1.0000 g | INTRAVENOUS | Status: DC
  Administered 2018-05-27: 1 g via INTRAVENOUS
  Filled 2018-05-27: qty 10

## 2018-05-27 MED ORDER — SODIUM CHLORIDE 0.9 % IV SOLN
INTRAVENOUS | Status: DC
  Administered 2018-05-27: 20:00:00 via INTRAVENOUS

## 2018-05-27 MED ORDER — HYDROCODONE-ACETAMINOPHEN 5-325 MG PO TABS: 1.0000 | ORAL_TABLET | ORAL | 0 refills | Status: DC | PRN

## 2018-05-27 MED ORDER — CEPHALEXIN 500 MG PO CAPS: 500.0000 mg | ORAL_CAPSULE | Freq: Four times a day (QID) | ORAL | 0 refills | Status: DC

## 2018-05-27 MED ORDER — IOHEXOL 300 MG/ML  SOLN
100.0000 mL | Freq: Once | INTRAMUSCULAR | Status: AC | PRN
  Administered 2018-05-27: 100 mL via INTRAVENOUS

## 2018-05-27 MED ORDER — MORPHINE SULFATE (PF) 4 MG/ML IV SOLN
4.0000 mg | Freq: Once | INTRAVENOUS | Status: AC
  Administered 2018-05-27: 4 mg via INTRAVENOUS
  Filled 2018-05-27: qty 1

## 2018-05-27 NOTE — ED Provider Notes (Signed)
11:00 PM  Assumed care from Dr. Freida Busman.  Patient is a 63 y.o. M with alcoholic cirrhosis who presents to the ED with R groin swelling, urinary retention 380 mL on bladder scan with foley now, ascites on CT but no SBP, UTI got Rocephin, referred to GI awaiting coags and LFTs.  CTAP shows right inguinal hernia containing ascites.  Will follow up GI and Urology.  12:00 AM  Pt's LFTs and coags are near his baseline.  On reevaluation, patient reports feeling much better after Foley catheter has been placed.  He is comfortable with plan for discharge home.  Of note, nursing staff has documented that patient has had a heart rate of 47 and 37.  He is not on a cardiac monitor and this heart rate that was documented was based off of his pulse oximeter which is not picking up a good waveform.  He denies chest pain, shortness of breath, dizziness.  We will recheck his vitals and obtain an EKG.   12:15 AM  Pt's EKG shows ventricular bigeminy.  He is asymptomatic.  Potassium level 3.7.  Will check magnesium level prior to discharge given this ectopy and history of alcohol abuse.   12:25 AM  Pt's magnesium is low at 1.3.  Will give IV replacement.  We will also optimize his potassium by giving 1 dose of oral replacement.  2:20 AM  Pt's ectopy is improving.  No longer having bigeminy.  Will discharge home with outpatient follow up.  I reviewed all nursing notes, vitals, pertinent previous records, EKGs, lab and urine results, imaging (as available).    EKG Interpretation  Date/Time:  Wednesday May 28 2018 00:05:39 EDT Ventricular Rate:  106 PR Interval:    QRS Duration: 97 QT Interval:  397 QTC Calculation: 441 R Axis:   90 Text Interpretation:  Sinus tachycardia Ventricular bigeminy that is new compared to prior \ Borderline right axis deviation Confirmed by Rochele Raring [phone]) on 05/28/2018 12:11:24 AM         Ward, Layla Maw, DO 05/28/18 4665

## 2018-05-27 NOTE — ED Provider Notes (Addendum)
[facility]   CSN: [id number] Arrival date & time: 05/27/18  1947    History   Chief Complaint Chief Complaint  Patient presents with  . Groin Swelling  . Urinary Retention    HPI Harold HedgeDavid Murph is a 63 y.o. male.     63 year old male presents with acute onset of right groin pain since approximate 8 o'clock this morning.  Has had urinary urgency and frequency with only small amounts of urine coming out.  Slight dysuria without fever or chills.  No flank discomfort.  No penile drainage or discharge.  Has had similar symptoms in the past requiring catheterization.  Denies any prior history of inguinal hernia.  Patient has had nausea but no emesis.  No treatment used prior to arrival     Past Medical History:  Diagnosis Date  . Bipolar disorder (HCC)   . Blindness of left eye   . Cirrhosis of liver (HCC)   . Depression   . Hepatitis C   . Polysubstance abuse [facility](HCC)     Patient Active Problem List   Diagnosis Date Noted  . Aspiration pneumonia (HCC) 02/27/2018  . Bacteremia due to Gram-negative bacteria 02/27/2018  . Tachypnea   . Alcohol withdrawal (HCC) 02/20/2018  . Thrombocytopenia (HCC) 06/13/2014  . Alcohol intoxication (HCC) 06/13/2014  . Normocytic anemia 06/13/2014  . Peripheral edema 06/13/2014  . Left epididymitis 06/13/2014  . Dysuria 06/13/2014  . Alcohol abuse   . Alcoholic cirrhosis of liver with ascites (HCC)   . Chronic hepatitis C without hepatic coma (HCC) 06/07/2014  . Hepatic cirrhosis (HCC) 06/07/2014  . Elevated BP 04/21/2014  . Alcohol use disorder, severe, dependence (HCC) 12/30/2013  . Cocaine use disorder, mild, abuse (HCC) 12/30/2013  . Substance induced mood disorder (HCC) 12/30/2013    Past Surgical History:  Procedure Laterality Date  . EYE SURGERY          Home Medications    Prior to Admission medications   Medication Sig Start Date End Date Taking? Authorizing Provider   folic acid (FOLVITE) 1 MG tablet Take 1 tablet (1 mg total) by mouth daily. 03/07/18   Hongalgi, Maximino GreenlandAnand D, MD  Multiple Vitamin (MULTIVITAMIN WITH MINERALS) TABS tablet Take 1 tablet by mouth daily. 03/07/18   Hongalgi, Maximino GreenlandAnand D, MD  nicotine (NICODERM CQ - DOSED IN MG/24 HOURS) 21 mg/24hr patch Place 1 patch (21 mg total) onto the skin daily. 03/07/18   Hongalgi, Maximino GreenlandAnand D, MD  polyethylene glycol (MIRALAX / GLYCOLAX) packet Take 17 g by mouth daily. 03/07/18   Hongalgi, Maximino GreenlandAnand D, MD  tamsulosin (FLOMAX) 0.4 MG CAPS capsule Take 1 capsule (0.4 mg total) by mouth daily. 03/07/18   Hongalgi, Maximino GreenlandAnand D, MD  thiamine 100 MG tablet Take 1 tablet (100 mg total) by mouth daily. 03/07/18   Hongalgi, Maximino GreenlandAnand D, MD    Family History Family History  Problem Relation Age of Onset  . Diabetes Brother     Social History Social History   Tobacco Use  . Smoking status: Current Every Day Smoker    Packs/day: 1.00    Years: 40.00    Pack years: 40.00    Types: Cigarettes  . Smokeless tobacco: Never Used  Substance Use Topics  . Alcohol use: Yes    Alcohol/week: 8.0 standard drinks    Types: 8 Cans of beer per week  . Drug use: Yes    Types: IV, Cocaine     Allergies   Patient has  no known allergies.   Review of Systems Review of Systems  All other systems reviewed and are negative.    Physical Exam Updated Vital Signs BP (!) 157/99 (BP Location: Left Arm)   Pulse (!) 102   Temp 98 F (36.7 C) (Oral)   SpO2 100%   Physical Exam Vitals signs and nursing note reviewed.  Constitutional:      General: He is not in acute distress.    Appearance: Normal appearance. He is well-developed. He is not toxic-appearing.  HENT:     Head: Normocephalic and atraumatic.  Eyes:     General: Lids are normal.     Conjunctiva/sclera: Conjunctivae normal.     Pupils: Pupils are equal, round, and reactive to light.  Neck:     Musculoskeletal: Normal range of motion and neck supple.     Thyroid: No thyroid mass.      Trachea: No tracheal deviation.  Cardiovascular:     Rate and Rhythm: Normal rate and regular rhythm.     Heart sounds: Normal heart sounds. No murmur. No gallop.   Pulmonary:     Effort: Pulmonary effort is normal. No respiratory distress.     Breath sounds: Normal breath sounds. No stridor. No decreased breath sounds, wheezing, rhonchi or rales.  Abdominal:     General: Bowel sounds are normal. There is no distension.     Palpations: Abdomen is soft.     Tenderness: There is no abdominal tenderness. There is no rebound.     Hernia: A hernia is present. Hernia is present in the right inguinal area.  Genitourinary:    Penis: Uncircumcised.      Scrotum/Testes:        Right: Tenderness and swelling present.    Musculoskeletal: Normal range of motion.        General: No tenderness.  Skin:    General: Skin is warm and dry.     Findings: No abrasion or rash.  Neurological:     Mental Status: He is alert and oriented to person, place, and time.     GCS: GCS eye subscore is 4. GCS verbal subscore is 5. GCS motor subscore is 6.     Cranial Nerves: No cranial nerve deficit.     Sensory: No sensory deficit.  Psychiatric:        Speech: Speech normal.        Behavior: Behavior normal.      ED Treatments / Results  Labs (all labs ordered are listed, but only abnormal results are displayed) Labs Reviewed  URINE CULTURE  CBC WITH DIFFERENTIAL/PLATELET  BASIC METABOLIC PANEL  URINALYSIS, ROUTINE W REFLEX MICROSCOPIC    EKG None  Radiology No results found.  Procedures Procedures (including critical care time)  Medications Ordered in ED Medications  0.9 %  sodium chloride infusion (has no administration in time range)  morphine 4 MG/ML injection 4 mg (has no administration in time range)     Initial Impression / Assessment and Plan / ED Course  I have reviewed the triage vital signs and the nursing notes.  Pertinent labs & imaging results that were available  during my care of the patient were reviewed by me and considered in my medical decision making (see chart for details).        Patient evidence of urinary retention based on bladder scan with 180 cc of urine.  Foley placed by nursing. Attempted to reduce patient's right inguinal hernia x2 unsuccessfully.  Urinalysis does show  infection.  He was medicated for pain.  Concern for possible incarceration.  Will consult general surgery and patient started on IV Rocephin for his UTI.  10:50 PM Case discussed with Dr. Andrey Campanile from general surgery who are reviewed the patient CT scan and states that there is no evidence of bowel strangulation.  Patient does have ascites that tracks down into the right hemiscrotum.  The patient has had no abdominal discomfort.  He has had no fever.  He is not short of breath.  His pain is well controlled at this time and does not have any evidence of peritonitis.  Patient will follow-up with his urologist as he will go home with his Foley catheter and will be placed on antibiotics.  He will be given referral to gastroenterology for his ascites.  Return precautions given  Final Clinical Impressions(s) / ED Diagnoses   Final diagnoses:  None    ED Discharge Orders    None       Lorre Nick, MD 05/27/18 2121    Lorre Nick, MD 05/27/18 2252

## 2018-05-27 NOTE — ED Notes (Signed)
Patient transported to CT via stretcher.

## 2018-05-27 NOTE — ED Notes (Signed)
Bed: BZ20 Expected date:  Expected time:  Means of arrival:  Comments: 63 yr old groin swelling

## 2018-05-27 NOTE — ED Notes (Addendum)
Bladder scanned 383 mL of urine in bladder, repeated x2 for accuracy. Ice applied to groin area.

## 2018-05-27 NOTE — ED Triage Notes (Signed)
Patient coming from home with complaints of groin swelling and urinary retention starting this morning around 8 AM. Swelling has gotten increasingly worse throughout the day and when attempting to void, only has dribbles of urine come out. Patient had this problem in the past (about 2 months ago) and was catheterized. Patient reports that he did follow up with urology regarding issue.

## 2018-05-28 LAB — MAGNESIUM: Magnesium: 1.3 mg/dL — ABNORMAL LOW (ref 1.7–2.4)

## 2018-05-28 MED ORDER — POTASSIUM CHLORIDE CRYS ER 20 MEQ PO TBCR
40.0000 meq | EXTENDED_RELEASE_TABLET | Freq: Once | ORAL | Status: AC
  Administered 2018-05-28: 40 meq via ORAL
  Filled 2018-05-28: qty 2

## 2018-05-28 MED ORDER — VITAMIN B-1 100 MG PO TABS: 100.0000 mg | ORAL_TABLET | Freq: Every day | ORAL | 0 refills | Status: DC

## 2018-05-28 MED ORDER — FOLIC ACID 1 MG PO TABS: 1.0000 mg | ORAL_TABLET | Freq: Every day | ORAL | 0 refills | Status: DC

## 2018-05-28 MED ORDER — MAGNESIUM SULFATE 2 GM/50ML IV SOLN
2.0000 g | Freq: Once | INTRAVENOUS | Status: AC
  Administered 2018-05-28: 2 g via INTRAVENOUS
  Filled 2018-05-28: qty 50

## 2018-05-28 NOTE — ED Notes (Signed)
Patient attempting to call a ride

## 2018-05-30 ENCOUNTER — Emergency Department (HOSPITAL_COMMUNITY)
Admission: EM | Admit: 2018-05-30 | Discharge: 2018-05-30 | Disposition: A | Discharge status: Home / Self Care | Payer: Medicaid Other | Attending: Emergency Medicine | Admitting: Emergency Medicine

## 2018-05-30 ENCOUNTER — Emergency Department (HOSPITAL_COMMUNITY): Payer: Medicaid Other

## 2018-05-30 ENCOUNTER — Encounter (HOSPITAL_COMMUNITY): Payer: Self-pay | Admitting: Emergency Medicine

## 2018-05-30 DIAGNOSIS — R1030 Lower abdominal pain, unspecified: Secondary | ICD-10-CM | POA: Insufficient documentation

## 2018-05-30 DIAGNOSIS — K7031 Alcoholic cirrhosis of liver with ascites: Secondary | ICD-10-CM

## 2018-05-30 DIAGNOSIS — N390 Urinary tract infection, site not specified: Secondary | ICD-10-CM | POA: Insufficient documentation

## 2018-05-30 DIAGNOSIS — R1031 Right lower quadrant pain: Secondary | ICD-10-CM | POA: Diagnosis present

## 2018-05-30 DIAGNOSIS — F17210 Nicotine dependence, cigarettes, uncomplicated: Secondary | ICD-10-CM | POA: Insufficient documentation

## 2018-05-30 LAB — URINE CULTURE: Culture: >=100000 — AB

## 2018-05-30 MED ORDER — SULFAMETHOXAZOLE-TRIMETHOPRIM 800-160 MG PO TABS: 1.0000 | ORAL_TABLET | Freq: Two times a day (BID) | ORAL | 0 refills | Status: AC

## 2018-05-30 MED ORDER — SULFAMETHOXAZOLE-TRIMETHOPRIM 800-160 MG PO TABS
1.0000 | ORAL_TABLET | Freq: Once | ORAL | Status: AC
  Administered 2018-05-30: 1 via ORAL
  Filled 2018-05-30: qty 1

## 2018-05-30 NOTE — ED Provider Notes (Signed)
Patient's ultrasound does not show any concerning findings.  This appears to have fluid from the ascites causing displacement of his testicle.  Otherwise he appears well.  He has outpatient GI follow-up for the ascites.  Encouraged on the importance of starting the antibiotics.  Results for orders placed or performed during the hospital encounter of 05/27/18  Urine Culture  Result Value Ref Range   Specimen Description      URINE, CLEAN CATCH Performed at [facility], [address], [place], [place] [address]    Special Requests      NONE Performed at [facility], [address] [place], [place] [address]    Culture >=100,000 COLONIES/mL ESCHERICHIA COLI (A)    Report Status 05/30/2018 FINAL    Organism ID, Bacteria ESCHERICHIA COLI (A)       Susceptibility   Escherichia coli - MIC*    AMPICILLIN >=32 RESISTANT Resistant     CEFAZOLIN 16 SENSITIVE Sensitive     CEFTRIAXONE <=1 SENSITIVE Sensitive     CIPROFLOXACIN INTERMEDIATE Intermediate     GENTAMICIN <=1 SENSITIVE Sensitive     IMIPENEM <=0.25 SENSITIVE Sensitive     NITROFURANTOIN <=16 SENSITIVE Sensitive     TRIMETH/SULFA <=20 SENSITIVE Sensitive     AMPICILLIN/SULBACTAM 8 SENSITIVE Sensitive     PIP/TAZO <=4 SENSITIVE Sensitive     Extended ESBL NEGATIVE Sensitive     * >=100,000 COLONIES/mL ESCHERICHIA COLI  CBC with Differential/Platelet  Result Value Ref Range   WBC 6.1 4.0 - 10.5 K/uL   RBC 3.54 (L) 4.22 - 5.81 MIL/uL   Hemoglobin 11.2 (L) 13.0 - 17.0 g/dL   HCT 31.5 (L) 94.5 - 85.9 %   MCV 98.3 80.0 - 100.0 fL   MCH 31.6 26.0 - 34.0 pg   MCHC 32.2 30.0 - 36.0 g/dL   RDW 29.2 (H) 44.6 - 28.6 %   Platelets 45 (L) 150 - 400 K/uL   nRBC 0.0 0.0 - 0.2 %   Neutrophils Relative % 49 %   Neutro Abs 2.9 1.7 - 7.7 K/uL   Lymphocytes Relative 43 %   Lymphs Abs 2.6 0.7 - 4.0 K/uL   Monocytes Relative 8 %   Monocytes Absolute 0.5 0.1 - 1.0 K/uL   Eosinophils Relative 0 %    Eosinophils Absolute 0.0 0.0 - 0.5 K/uL   Basophils Relative 0 %   Basophils Absolute 0.0 0.0 - 0.1 K/uL   Immature Granulocytes 0 %   Abs Immature Granulocytes 0.02 0.00 - 0.07 K/uL  Basic metabolic panel  Result Value Ref Range   Sodium 138 135 - 145 mmol/L   Potassium 3.7 3.5 - 5.1 mmol/L   Chloride 107 98 - 111 mmol/L   CO2 24 22 - 32 mmol/L   Glucose, Bld 111 (H) 70 - 99 mg/dL   BUN 9 8 - 23 mg/dL   Creatinine, Ser 3.81 0.61 - 1.24 mg/dL   Calcium 8.1 (L) 8.9 - 10.3 mg/dL   GFR calc non Af Amer >60 >60 mL/min   GFR calc Af Amer >60 >60 mL/min   Anion gap 7 5 - 15  Urinalysis, Routine w reflex microscopic  Result Value Ref Range   Color, Urine AMBER (A) YELLOW   APPearance HAZY (A) CLEAR   Specific Gravity, Urine 1.024 1.005 - 1.030   pH 5.0 5.0 - 8.0   Glucose, UA NEGATIVE NEGATIVE mg/dL   Hgb urine dipstick SMALL (A) NEGATIVE   Bilirubin Urine NEGATIVE NEGATIVE  Ketones, ur 5 (A) NEGATIVE mg/dL   Protein, ur 30 (A) NEGATIVE mg/dL   Nitrite POSITIVE (A) NEGATIVE   Leukocytes,Ua MODERATE (A) NEGATIVE   RBC / HPF 6-10 0 - 5 RBC/hpf   WBC, UA >50 (H) 0 - 5 WBC/hpf   Bacteria, UA RARE (A) NONE SEEN   Mucus PRESENT    Hyaline Casts, UA PRESENT   APTT  Result Value Ref Range   aPTT 33 24 - 36 seconds  Lactic acid, plasma  Result Value Ref Range   Lactic Acid, Venous 1.3 0.5 - 1.9 mmol/L  Hepatic function panel  Result Value Ref Range   Total Protein 7.0 6.5 - 8.1 g/dL   Albumin 2.4 (L) 3.5 - 5.0 g/dL   AST 24 15 - 41 U/L   ALT 14 0 - 44 U/L   Alkaline Phosphatase 92 38 - 126 U/L   Total Bilirubin 1.8 (H) 0.3 - 1.2 mg/dL   Bilirubin, Direct 0.6 (H) 0.0 - 0.2 mg/dL   Indirect Bilirubin 1.2 (H) 0.3 - 0.9 mg/dL  Protime-INR  Result Value Ref Range   Prothrombin Time 16.2 (H) 11.4 - 15.2 seconds   INR 1.3 (H) 0.8 - 1.2  Magnesium  Result Value Ref Range   Magnesium 1.3 (L) 1.7 - 2.4 mg/dL  Type and screen  Result Value Ref Range   ABO/RH(D) O POS    Antibody  Screen NEG    Sample Expiration      05/30/2018 Performed at [facility], [address], [place], [place] [address]    Ct Abdomen Pelvis W Contrast  Result Date: 05/27/2018 CLINICAL DATA:  63 year old male with abdominal distension, right groin pain since 0800 hours. EXAM: CT ABDOMEN AND PELVIS WITH CONTRAST TECHNIQUE: Multidetector CT imaging of the abdomen and pelvis was performed using the standard protocol following bolus administration of intravenous contrast. CONTRAST:  OMNIPAQUE IOHEXOL 300 MG/ML  SOLN COMPARISON:  CT Abdomen and Pelvis 02/20/2018 FINDINGS: Lower chest: Calcified coronary artery atherosclerosis. Calcified aortic atherosclerosis. No pericardial effusion or cardiomegaly. Trace layering right pleural effusion. Mild right lower lobe enhancing atelectasis. Mild dependent atelectasis on the left. Hepatobiliary: Partially atrophied and cirrhotic appearing liver with ascites. The gallbladder is mildly distended. Bile ducts appear grossly stable. No discrete liver lesion. Pancreas: Stable with mild prominence of the main pancreatic duct. Spleen: No splenomegaly. Adrenals/Urinary Tract: Normal adrenal glands. Bilateral renal enhancement and contrast excretion is symmetric and within normal limits. Urinary bladder is decompressed by a Foley catheter. Stomach/Bowel: Ascites throughout the abdomen with no dilated large or small bowel. Evidence of a normal appendix. Decompressed stomach. 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 No free air. Vascular/Lymphatic: Extensive Aortoiliac calcified atherosclerosis. Major arterial structures are patent. There is high-grade stenosis of the proximal common right iliac artery and likely both internal iliac arteries. High-grade stenosis suspected at the right femoral artery bifurcation. Portal venous system is patent. Reproductive: Right inguinal hernia containing ascites which tracks into the right  hemiscrotum. Possible superimposed scrotal hydroceles. Urinary catheter in place. Other: Ascites in the pelvis. Musculoskeletal: No acute osseous abnormality identified. IMPRESSION: 1. 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 Moderate to large volume ascites.  No discrete liver lesion. 2. Ascites tracks into the right hemiscrotum via an inguinal hernia. There might be superimposed small scrotal hydroceles. 3. Urinary bladder is decompressed by a Foley catheter. Renal enhancement and contrast excretion appears normal. 4. Extensive Aortic Atherosclerosis (ICD10-I70.0) and other calcified atherosclerosis. Hemodynamically significant  iliac and femoral artery stenoses suspected. Electronically Signed   By: Odessa FlemingH  Hall M.D.   On: 05/27/2018 22:12   [place] Scrotum W/doppler  Result Date: 05/30/2018 CLINICAL DATA:  RIGHT groin pain; history cirrhosis and ascites EXAM: SCROTAL ULTRASOUND DOPPLER ULTRASOUND OF THE TESTICLES TECHNIQUE: Complete ultrasound examination of the testicles, epididymis, and other scrotal structures was performed. Color and spectral Doppler ultrasound were also utilized to evaluate blood flow to the testicles. COMPARISON:  None Correlation: CT abdomen and pelvis 05/27/2018 FINDINGS: Right testicle Measurements: 3.5 x 2.3 x 1.7 cm. Displaced to the LEFT by a large RIGHT scrotal fluid collection. No evidence of testicular mass or calcification. Internal blood flow present on color Doppler imaging. Left testicle Measurements: 3.7 x 1.7 x 2.1 cm. Minimally inhomogeneous echogenicity without mass or calcification. Internal blood flow present on color Doppler imaging. Right epididymis:  Normal in size and appearance. Left epididymis: LEFT epididymal head appears enlarged and is heterogeneous versus RIGHT. No discrete mass. Hydrocele: LEFT hydrocele. In addition, large complicated septated fluid collection within the RIGHT hemiscrotum, extending into the RIGHT inguinal canal, largest locules  measuring 3.7 x 3.6 x 4.4 cm and 7.9 x 4.7 x 5.1 cm. No mural nodularity. Observed septations are thin and smooth. In correlation with prior CT exam, this appears to represent a patent RIGHT inguinal canal with significant extension of ascites from the peritoneal cavity through the RIGHT inguinal canal into the RIGHT hemiscrotum. Varicocele:  None visualized. Pulsed Doppler interrogation of both testes demonstrates normal low resistance arterial and venous waveforms bilaterally. IMPRESSION: No focal testicular abnormalities. Heterogeneous appearing mildly enlarged LEFT epididymal head without discrete mass or hypervascularity, question sequela of prior epididymitis. Small LEFT hydrocele with large multiloculated fluid collection within the RIGHT hemiscrotum in the RIGHT inguinal canal. In combination with the prior CT exam, this likely represents a patent RIGHT inguinal canal with extension of significant ascites from the peritoneal cavity of the pelvis through the RIGHT inguinal canal into the RIGHT hemiscrotum, displacing the RIGHT testis to the RIGHT. Electronically Signed   By: Ulyses SouthwardMark  Boles M.D.   On: 05/30/2018 17:47      Pricilla LovelessGoldston, Abdimalik Mayorquin, MD 05/30/18 1758

## 2018-05-30 NOTE — ED Notes (Signed)
Bed: WTR9 Expected date:  Expected time:  Means of arrival:  Comments: 

## 2018-05-30 NOTE — Discharge Instructions (Signed)
It is important to take the antibiotics prescribed to you.  The swelling and pain in your testicle appears to be from the fluid in your abdomen called ascites.  Follow-up with the gastroenterologist you were referred to for further outpatient management of this.  If your pain becomes new or worse, you get fever, vomiting, or any other new/concerning symptoms then return to the ER for evaluation.

## 2018-05-30 NOTE — ED Provider Notes (Signed)
[facility]-EMERGENCY DEPT Provider Note   CSN: [id number] Arrival date & time: 05/30/18  1425    History   Chief Complaint Chief Complaint  Patient presents with   Groin Pain    HPI Albert Salazar is a 63 y.o. male.     The history is provided by the patient and medical records. No language interpreter was used.  Groin Pain  This is a recurrent problem. The current episode started more than 2 days ago. The problem occurs constantly. The problem has been gradually worsening. Pertinent negatives include no chest pain, no abdominal pain, no headaches and no shortness of breath. Nothing aggravates the symptoms. Nothing relieves the symptoms. He has tried nothing for the symptoms. The treatment provided no relief.    Past Medical History:  Diagnosis Date   Bipolar disorder (HCC)    Blindness of left eye    Cirrhosis of liver (HCC)    Depression    Hepatitis C    Polysubstance abuse (HCC)     Patient Active Problem List   Diagnosis Date Noted   Aspiration pneumonia (HCC) 02/27/2018   Bacteremia due to Gram-negative bacteria 02/27/2018   Tachypnea    Alcohol withdrawal (HCC) 02/20/2018   Thrombocytopenia (HCC) 06/13/2014   Alcohol intoxication (HCC) 06/13/2014   Normocytic anemia 06/13/2014   Peripheral edema 06/13/2014   Left epididymitis 06/13/2014   Dysuria 06/13/2014   Alcohol abuse    Alcoholic cirrhosis of liver with ascites (HCC)    Chronic hepatitis C without hepatic coma (HCC) 06/07/2014   Hepatic cirrhosis (HCC) 06/07/2014   Elevated BP 04/21/2014   Alcohol use disorder, severe, dependence (HCC) 12/30/2013   Cocaine use disorder, mild, abuse (HCC) 12/30/2013   Substance induced mood disorder (HCC) 12/30/2013    Past Surgical History:  Procedure Laterality Date   EYE SURGERY          Home Medications    Prior to Admission medications   Medication Sig Start Date End Date Taking? Authorizing Provider    cephALEXin (KEFLEX) 500 MG capsule Take 1 capsule (500 mg total) by mouth 4 (four) times daily. 05/27/18   Lorre Nick, MD  folic acid (FOLVITE) 1 MG tablet Take 1 tablet (1 mg total) by mouth daily. 05/28/18   Ward, Layla Maw, DO  tamsulosin (FLOMAX) 0.4 MG CAPS capsule Take 1 capsule (0.4 mg total) by mouth daily. 03/07/18   Hongalgi, Maximino Greenland, MD  thiamine (VITAMIN B-1) 100 MG tablet Take 1 tablet (100 mg total) by mouth daily. 05/28/18   Ward, Layla Maw, DO    Family History Family History  Problem Relation Age of Onset   Diabetes Brother     Social History Social History   Tobacco Use   Smoking status: Current Every Day Smoker    Packs/day: 1.00    Years: 40.00    Pack years: 40.00    Types: Cigarettes   Smokeless tobacco: Never Used  Substance Use Topics   Alcohol use: Yes    Alcohol/week: 8.0 standard drinks    Types: 8 Cans of beer per week   Drug use: Yes    Types: IV, Cocaine     Allergies   Patient has no known allergies.   Review of Systems Review of Systems  Constitutional: Negative for chills, diaphoresis, fatigue and fever.  HENT: Negative for congestion.   Respiratory: Negative for cough, chest tightness, shortness of breath and wheezing.   Cardiovascular: Negative for chest pain, palpitations and leg swelling.  Gastrointestinal: Negative for abdominal pain, constipation, diarrhea, nausea and vomiting.  Genitourinary: Positive for scrotal swelling. Negative for decreased urine volume, difficulty urinating, discharge, penile pain and penile swelling.  Musculoskeletal: Negative for neck pain and neck stiffness.  Skin: Negative for rash and wound.  Neurological: Negative for headaches.  All other systems reviewed and are negative.    Physical Exam Updated Vital Signs BP (!) 150/90 (BP Location: Right Arm)    Pulse 95    Temp 98.7 F (37.1 C) (Oral)    Resp 16    SpO2 100%   Physical Exam Vitals signs and nursing note reviewed.  Constitutional:       Appearance: He is well-developed.  HENT:     Head: Normocephalic and atraumatic.     Nose: No congestion or rhinorrhea.     Mouth/Throat:     Mouth: Mucous membranes are moist.     Pharynx: No oropharyngeal exudate or posterior oropharyngeal erythema.  Eyes:     Conjunctiva/sclera: Conjunctivae normal.     Pupils: Pupils are equal, round, and reactive to light.  Neck:     Musculoskeletal: Neck supple.  Cardiovascular:     Rate and Rhythm: Normal rate and regular rhythm.     Pulses: Normal pulses.     Heart sounds: No murmur.  Pulmonary:     Effort: Pulmonary effort is normal. No respiratory distress.     Breath sounds: Normal breath sounds.  Abdominal:     General: Abdomen is flat. There is no distension.     Palpations: Abdomen is soft.     Tenderness: There is no abdominal tenderness. There is no right CVA tenderness, left CVA tenderness or rebound.     Hernia: A hernia is present.  Genitourinary:    Scrotum/Testes:        Right: Tenderness and swelling present.        Left: Tenderness or swelling not present.       Comments: Foley in place. Right inguinal and hemiscrotum swelling and mild tenderness.  No erythema. Musculoskeletal:        General: No tenderness.  Skin:    General: Skin is warm and dry.     Capillary Refill: Capillary refill takes less than 2 seconds.     Findings: No erythema or rash.  Neurological:     General: No focal deficit present.     Mental Status: He is alert.  Psychiatric:        Mood and Affect: Mood normal.      ED Treatments / Results  Labs (all labs ordered are listed, but only abnormal results are displayed) Labs Reviewed - No data to display  EKG None  Radiology US Scrotum W/doppler  Result Date: 05/30/2018 CLINICAL DATA:  RIGHT groin pain; history cirrhosis and ascites EXAM: SCROTAL ULTRASOUND DOPPLER ULTRASOUND OF THE TESTICLES TECHNIQUE: Complete ultrasound examination of the testicles, epididymis, and other scrotal  structures was performed. Color and spectral Doppler ultrasound were also utilized to evaluate blood flow to the testicles. COMPARISON:  None Correlation: CT abdomen and pelvis 05/27/2018 FINDINGS: Right testicle Measurements: 3.5 x 2.3 x 1.7 cm. Displaced to the LEFT by a large RIGHT scrotal fluid collection. No evidence of testicular mass or calcification. Internal blood flow present on color Doppler imaging. Left testicle Measurements: 3.7 x 1.7 x 2.1 cm. Minimally inhomogeneous echogenicity without mass or calcification. Internal blood flow present on color Doppler imaging. Right epididymis:  Normal in size and appearance. Left epididymis: LEFT epididymal head  appears enlarged and is heterogeneous versus RIGHT. No discrete mass. Hydrocele: LEFT hydrocele. In addition, large complicated septated fluid collection within the RIGHT hemiscrotum, extending into the RIGHT inguinal canal, largest locules measuring 3.7 x 3.6 x 4.4 cm and 7.9 x 4.7 x 5.1 cm. No mural nodularity. Observed septations are thin and smooth. In correlation with prior CT exam, this appears to represent a patent RIGHT inguinal canal with significant extension of ascites from the peritoneal cavity through the RIGHT inguinal canal into the RIGHT hemiscrotum. Varicocele:  None visualized. Pulsed Doppler interrogation of both testes demonstrates normal low resistance arterial and venous waveforms bilaterally. IMPRESSION: No focal testicular abnormalities. Heterogeneous appearing mildly enlarged LEFT epididymal head without discrete mass or hypervascularity, question sequela of prior epididymitis. Small LEFT hydrocele with large multiloculated fluid collection within the RIGHT hemiscrotum in the RIGHT inguinal canal. In combination with the prior CT exam, this likely represents a patent RIGHT inguinal canal with extension of significant ascites from the peritoneal cavity of the pelvis through the RIGHT inguinal canal into the RIGHT hemiscrotum,  displacing the RIGHT testis to the RIGHT. Electronically Signed   By: Ulyses SouthwardMark  Boles M.D.   On: 05/30/2018 17:47    Procedures Procedures (including critical care time)  Medications Ordered in ED Medications  sulfamethoxazole-trimethoprim (BACTRIM DS) 800-160 MG per tablet 1 tablet (has no administration in time range)     Initial Impression / Assessment and Plan / ED Course  I have reviewed the triage vital signs and the nursing notes.  Pertinent labs & imaging results that were available during my care of the patient were reviewed by me and considered in my medical decision making (see chart for details).        Albert HedgeDavid Staniszewski is a 63 y.o. male with a past medical history significant for polysubstance abuse, hepatitis C, ascites with right inguinal hernia, and recent urinary tract infection with associated urinary retention with Foley dependence who presents with right groin swelling.  Patient reports that he had a Foley catheter placed recently and was found to have urinary retention.  He was also found of UTI.  Patient was started on antibiotics however he reports that he did not go to a pharmacy to fill the prescription.  He has not taken other antibiotics aside from the Rocephin he received to the emergency department.  He reports the urine in the catheter looks infected still but denies any significant dysuria.  He does report that his right groin has swollen more with right inguinal pain.  He was told that he had a hernia there with ascites fluid filling it.  He denies any fevers, chills, nausea, vomiting, constipation, or diarrhea.  He denies any abdominal pain or flank pain.  Denies other complaints.  On exam, patient does have a swollen and bulging right inguinal area and right hemiscrotum.  Patient has some tenderness in the right scrotal area.  Foley catheter appears to be in place.  No abdominal tenderness otherwise.  Lungs clear and chest is nontender.  Patient has normal gait and  otherwise acting normally.  Clinically suspect that he has worsened ascites extending to his right scrotum.  I have low suspicion for torsion however as he reports the pain has worsened, will get ultrasound to rule out torsion.  CT scan reviewed from the other day shows that he does not have bowel in the hernia but rather has ascites fluid is causing the fluid in his inguinal area and scrotum.  Based on history and  exam, low suspicion for development of cellulitis or Fournier's gangrene.  Due to his known UTI and lack of treatment, do not feel he needs repeat urinalysis at this time.  We had a shared decision made conversation agreed to have no new labs performed.  Patient will need to be represcribed antibiotics.  Will get ultrasound to look for torsion.  Spoke to pharmacy due to discrepancy in the chart for urine culture.  They recommend switching the patient to Bactrim twice daily for 7 days.  Patient be given a dose right now.  This will be ordered and printed for him.  If ultrasound is negative, anticipate discharge and follow-up with urology.   Final Clinical Impressions(s) / ED Diagnoses   Final diagnoses:  Acute UTI (urinary tract infection)  Ascites due to alcoholic cirrhosis [facility])    ED Discharge Orders         Ordered    sulfamethoxazole-trimethoprim (BACTRIM DS) 800-160 MG tablet  2 times daily     05/30/18 1716          Clinical Impression: 1. Acute UTI (urinary tract infection)   2. Ascites due to alcoholic cirrhosis [facility])     Disposition: Care transferred to Dr. Criss Alvine already ultrasound results.  Anticipate discharge with his new antibiotics with urology follow-up.   This note was prepared with assistance of Conservation officer, historic buildings. Occasional wrong-word or sound-a-like substitutions may have occurred due to the inherent limitations of voice recognition software.     Gearold Wainer, Canary Brim, MD 05/30/18 [phone]

## 2018-05-30 NOTE — ED Notes (Signed)
Leg bag applied to foley catheter, pt instructed on home care.

## 2018-05-30 NOTE — ED Triage Notes (Signed)
Per EMS-was here on Wednesday for the same symptoms-patient went home with a foley cath-when EMS arrived, patient was holding cath in his hand-stated it was in his way-states he is complaining of urinary retention and groin pain

## 2018-05-31 ENCOUNTER — Telehealth: Payer: Self-pay | Admitting: Emergency Medicine

## 2018-05-31 NOTE — Telephone Encounter (Signed)
Post ED Visit - Positive Culture Follow-up  Culture report reviewed by antimicrobial stewardship pharmacist: [facility] []  Enzo Bi, Pharm.D. []  Celedonio Miyamoto, Pharm.D., BCPS AQ-ID []  Garvin Fila, Pharm.D., BCPS []  Georgina Pillion, Pharm.D., BCPS []  [place], [address].D., BCPS, AAHIVP []  Estella Husk, Pharm.D., BCPS, AAHIVP []  Lysle Pearl, PharmD, BCPS []  Phillips Climes, PharmD, BCPS []  Agapito Games, PharmD, BCPS []  Verlan Friends, PharmD []  Mervyn Gay, PharmD, BCPS []  Vinnie Level, PharmD  [facility] []  Len Childs, PharmD []  Greer Pickerel, PharmD []  Adalberto Cole, PharmD []  Perlie Gold, Rph []  Lonell Face) Jean Rosenthal, PharmD []  Earl Many, PharmD []  Junita Push, PharmD [x]  Dorna Leitz, PharmD []  Terrilee Files, PharmD []  Lynann Beaver, PharmD []  Keturah Barre, PharmD []  Loralee Pacas, PharmD []  Bernadene Person, PharmD   Positive urine culture Treated with cephalexin, organism sensitive to the same and no further patient follow-up is required at this time.  Carollee Herter Willford Rabideau 05/31/2018, 8:09 AM

## 2018-07-02 ENCOUNTER — Encounter (HOSPITAL_COMMUNITY): Payer: Self-pay | Admitting: Emergency Medicine

## 2018-07-02 ENCOUNTER — Emergency Department (HOSPITAL_COMMUNITY)
Admission: EM | Admit: 2018-07-02 | Discharge: 2018-07-02 | Disposition: A | Discharge status: Home / Self Care | Payer: Medicaid Other | Attending: Emergency Medicine | Admitting: Emergency Medicine

## 2018-07-02 ENCOUNTER — Other Ambulatory Visit: Payer: Self-pay

## 2018-07-02 DIAGNOSIS — F17210 Nicotine dependence, cigarettes, uncomplicated: Secondary | ICD-10-CM | POA: Insufficient documentation

## 2018-07-02 DIAGNOSIS — R339 Retention of urine, unspecified: Secondary | ICD-10-CM | POA: Diagnosis not present

## 2018-07-02 DIAGNOSIS — Z79899 Other long term (current) drug therapy: Secondary | ICD-10-CM | POA: Insufficient documentation

## 2018-07-02 DIAGNOSIS — N5089 Other specified disorders of the male genital organs: Secondary | ICD-10-CM | POA: Diagnosis present

## 2018-07-02 DIAGNOSIS — K4090 Unilateral inguinal hernia, without obstruction or gangrene, not specified as recurrent: Secondary | ICD-10-CM | POA: Diagnosis not present

## 2018-07-02 LAB — URINALYSIS, ROUTINE W REFLEX MICROSCOPIC
Bilirubin Urine: NEGATIVE
Glucose, UA: NEGATIVE mg/dL
Ketones, ur: NEGATIVE mg/dL
Nitrite: NEGATIVE
Protein, ur: NEGATIVE mg/dL
Specific Gravity, Urine: 1.006 (ref 1.005–1.030)
pH: 8 (ref 5.0–8.0)

## 2018-07-02 NOTE — ED Provider Notes (Signed)
[facility]-EMERGENCY DEPT Provider Note   CSN: [id number] Arrival date & time: 07/02/18  1104    History   Chief Complaint Chief Complaint  Patient presents with  . Dysuria    HPI Albert Salazar is a 63 y.o. male.     HPI   He presents for evaluation of swelling in his scrotum, from a hernia, and leaking around his Foley catheter.  He noted both of these problems this morning.  He denies fever, chills, vomiting, dizziness.  He denies abdominal or back pain.  He has a chronic Foley catheter which was replaced 2 days ago.  There are no other known modifying factors.  Past Medical History:  Diagnosis Date  . Bipolar disorder (HCC)   . Blindness of left eye   . Cirrhosis of liver (HCC)   . Depression   . Hepatitis C   . Polysubstance abuse [facility])     Patient Active Problem List   Diagnosis Date Noted  . Aspiration pneumonia (HCC) 02/27/2018  . Bacteremia due to Gram-negative bacteria 02/27/2018  . Tachypnea   . Alcohol withdrawal (HCC) 02/20/2018  . Thrombocytopenia (HCC) 06/13/2014  . Alcohol intoxication (HCC) 06/13/2014  . Normocytic anemia 06/13/2014  . Peripheral edema 06/13/2014  . Left epididymitis 06/13/2014  . Dysuria 06/13/2014  . Alcohol abuse   . Alcoholic cirrhosis of liver with ascites (HCC)   . Chronic hepatitis C without hepatic coma (HCC) 06/07/2014  . Hepatic cirrhosis (HCC) 06/07/2014  . Elevated BP 04/21/2014  . Alcohol use disorder, severe, dependence (HCC) 12/30/2013  . Cocaine use disorder, mild, abuse (HCC) 12/30/2013  . Substance induced mood disorder (HCC) 12/30/2013    Past Surgical History:  Procedure Laterality Date  . EYE SURGERY          Home Medications    Prior to Admission medications   Medication Sig Start Date End Date Taking? Authorizing Provider  cephALEXin (KEFLEX) 500 MG capsule Take 1 capsule (500 mg total) by mouth 4 (four) times daily. 05/27/18   Lorre Nick, MD  folic acid (FOLVITE) 1 MG  tablet Take 1 tablet (1 mg total) by mouth daily. 05/28/18   Ward, Layla Maw, DO  furosemide (LASIX) 20 MG tablet Take 20 mg by mouth daily. 06/18/18   [provider]  spironolactone (ALDACTONE) 50 MG tablet Take 50 mg by mouth daily. 06/18/18   [provider]  tamsulosin (FLOMAX) 0.4 MG CAPS capsule Take 1 capsule (0.4 mg total) by mouth daily. 03/07/18   Hongalgi, Maximino Greenland, MD  thiamine (VITAMIN B-1) 100 MG tablet Take 1 tablet (100 mg total) by mouth daily. 05/28/18   Ward, Layla Maw, DO    Family History Family History  Problem Relation Age of Onset  . Diabetes Brother     Social History Social History   Tobacco Use  . Smoking status: Current Every Day Smoker    Packs/day: 1.00    Years: 40.00    Pack years: 40.00    Types: Cigarettes  . Smokeless tobacco: Never Used  Substance Use Topics  . Alcohol use: Yes    Alcohol/week: 8.0 standard drinks    Types: 8 Cans of beer per week  . Drug use: Yes    Types: IV, Cocaine     Allergies   Patient has no known allergies.   Review of Systems Review of Systems  All other systems reviewed and are negative.    Physical Exam Updated Vital Signs BP (!) 162/85 (BP Location:  Right Arm)   Pulse (!) 110   Temp 98.2 F (36.8 C) (Oral)   Resp 18   SpO2 97%   Physical Exam Vitals signs and nursing note reviewed.  Constitutional:      General: He is not in acute distress.    Appearance: He is well-developed. He is not ill-appearing, toxic-appearing or diaphoretic.  HENT:     Head: Normocephalic and atraumatic.     Right Ear: External ear normal.     Left Ear: External ear normal.  Eyes:     Conjunctiva/sclera: Conjunctivae normal.     Pupils: Pupils are equal, round, and reactive to light.  Neck:     Musculoskeletal: Normal range of motion and neck supple.     Trachea: Phonation normal.  Cardiovascular:     Rate and Rhythm: Normal rate.  Pulmonary:     Effort: Pulmonary effort is normal.  Abdominal:      General: There is no distension.     Palpations: Abdomen is soft.     Tenderness: There is no abdominal tenderness.  Genitourinary:    Comments: Foley catheter in penis, penis appears normal.  No urethral discharge.  Large right inguinal hernia into the scrotum, nontender to palpation. Musculoskeletal: Normal range of motion.  Skin:    General: Skin is warm and dry.  Neurological:     Mental Status: He is alert and oriented to person, place, and time.     Cranial Nerves: No cranial nerve deficit.     Sensory: No sensory deficit.     Motor: No abnormal muscle tone.     Coordination: Coordination normal.  Psychiatric:        Behavior: Behavior normal.        Thought Content: Thought content normal.        Judgment: Judgment normal.      ED Treatments / Results  Labs (all labs ordered are listed, but only abnormal results are displayed) Labs Reviewed  URINE CULTURE  URINALYSIS, ROUTINE W REFLEX MICROSCOPIC    EKG None  Radiology No results found.  Procedures Procedures (including critical care time)  Medications Ordered in ED Medications - No data to display   Initial Impression / Assessment and Plan / ED Course  I have reviewed the triage vital signs and the nursing notes.  Pertinent labs & imaging results that were available during my care of the patient were reviewed by me and considered in my medical decision making (see chart for details).         Patient Vitals for the past 24 hrs:  BP Temp Temp src Pulse Resp SpO2  07/02/18 1117 (!) 162/85 98.2 F (36.8 C) Oral (!) 110 18 97 %    6:07 PM Reevaluation with update and discussion. After initial assessment and treatment, an updated evaluation reveals he is comfortable now in the urine is draining into the urine bag, slightly red.  Findings discussed and questions answered. [name]   Medical Decision Making: Foley catheter malfunction, with leakage around the Foley catheter.  Likely mild urinary  retention, versus clogged catheter.  Abnormal urine likely related to chronic Foley catheterization not urinary tract infection.  He does not have urine symptoms, or signs of infection.  No indication for further ED treatment or intervention.    CRITICAL CARE-no Performed by: [name]  Nursing Notes Reviewed/ Care Coordinated Applicable Imaging Reviewed Interpretation of Laboratory Data incorporated into ED treatment  The patient appears reasonably screened and/or stabilized for discharge  and I doubt any other medical condition or other [facility] requiring further screening, evaluation, or treatment in the ED at this time prior to discharge.  Plan: Home Medications-continue current; Home Treatments-rest, fluids; return here if the recommended treatment, does not improve the symptoms; Recommended follow up- PCP, PRN   Final Clinical Impressions(s) / ED Diagnoses   Final diagnoses:  None    ED Discharge Orders    None       Mancel BaleWentz, Aoki Wedemeyer, MD 07/02/18 [phone]

## 2018-07-02 NOTE — ED Triage Notes (Signed)
Patient c/o burning and foley leaking since this morning. Reports foley last changed two days ago.

## 2018-07-02 NOTE — Discharge Instructions (Addendum)
Follow-up with your doctor as needed for problems.  Call the general surgery office for an appointment to be seen about your right inguinal hernia.

## 2018-07-06 LAB — URINE CULTURE: Culture: >=100000 — AB

## 2018-07-07 ENCOUNTER — Telehealth: Payer: Self-pay | Admitting: Emergency Medicine

## 2018-07-07 NOTE — Telephone Encounter (Signed)
Post ED Visit - Positive Culture Follow-up: Successful Patient Follow-Up  Culture assessed and recommendations reviewed by:  []  Elenor Quinones, Pharm.D. []  Heide Guile, Pharm.D., BCPS AQ-ID []  Parks Neptune, Pharm.D., BCPS []  Alycia Rossetti, Pharm.D., BCPS [x]  [place], Pharm.D., BCPS, AAHIVP []  Legrand Como, Pharm.D., BCPS, AAHIVP []  Salome Arnt, PharmD, BCPS []  Johnnette Gourd, PharmD, BCPS []  Hughes Better, PharmD, BCPS []  Leeroy Cha, PharmD  Positive urine culture  [x]  Patient discharged without antimicrobial prescription and treatment is now indicated []  Organism is resistant to prescribed ED discharge antimicrobial []  Patient with positive blood cultures  Changes discussed with ED provider: Coral Ceo PA New antibiotic prescription start Cefdinir 300mg  po bid x 10 days   Attempting to contact patient   Hazle Nordmann 07/07/2018, 10:03 AM

## 2018-07-07 NOTE — Progress Notes (Signed)
ED Antimicrobial Stewardship Positive Culture Follow Up   Marvelous Bouwens is an 63 y.o. male who presented to [facility] on 07/02/2018 with a chief complaint of dysuria, clogged foley cath Chief Complaint  Patient presents with  . Dysuria    Recent Results (from the past 720 hour(s))  Urine culture     Status: Abnormal   Collection Time: 07/02/18  2:10 PM  Result Value Ref Range Status   Specimen Description   Final    URINE, CATHETERIZED Performed at [facility] [address], [place], [place] [address]    Special Requests   Final    NONE Performed at [facility] [place] [address] [place], [place] [address]    Culture (A)  Final    >=100,000 COLONIES/mL MORGANELLA MORGANII 40,000 COLONIES/mL CITROBACTER BRAAKII    Report Status 07/06/2018 FINAL  Final   Organism ID, Bacteria MORGANELLA MORGANII (A)  Final   Organism ID, Bacteria CITROBACTER BRAAKII (A)  Final      Susceptibility   Citrobacter braakii - MIC*    CEFAZOLIN >=64 RESISTANT Resistant     CEFTRIAXONE <=1 SENSITIVE Sensitive     CIPROFLOXACIN <=0.25 SENSITIVE Sensitive     GENTAMICIN <=1 SENSITIVE Sensitive     IMIPENEM 2 SENSITIVE Sensitive     NITROFURANTOIN <=16 SENSITIVE Sensitive     TRIMETH/SULFA <=20 SENSITIVE Sensitive     PIP/TAZO <=4 SENSITIVE Sensitive     * 40,000 COLONIES/mL CITROBACTER BRAAKII   Morganella morganii - MIC*    AMPICILLIN >=32 RESISTANT Resistant     CEFAZOLIN >=64 RESISTANT Resistant     CEFTRIAXONE 2 SENSITIVE Sensitive     CIPROFLOXACIN <=0.25 SENSITIVE Sensitive     GENTAMICIN <=1 SENSITIVE Sensitive     IMIPENEM 2 SENSITIVE Sensitive     NITROFURANTOIN 128 RESISTANT Resistant     TRIMETH/SULFA <=20 SENSITIVE Sensitive     AMPICILLIN/SULBACTAM >=32 RESISTANT Resistant     PIP/TAZO <=4 SENSITIVE Sensitive     * >=100,000 COLONIES/mL MORGANELLA MORGANII    []  Treated with , organism resistant to prescribed antimicrobial [x]  Patient  discharged originally without antimicrobial agent and treatment is now indicated  New antibiotic prescription: cefdinir 300mg  PO BID x10days ED Provider: Coral Ceo, [place], Maurice Ramseur P 07/07/2018, 9:23 AM Clinical Pharmacist [phone]

## 2018-10-22 ENCOUNTER — Encounter (HOSPITAL_COMMUNITY): Payer: Self-pay

## 2018-10-22 ENCOUNTER — Other Ambulatory Visit: Payer: Self-pay

## 2018-10-22 DIAGNOSIS — Z5321 Procedure and treatment not carried out due to patient leaving prior to being seen by health care provider: Secondary | ICD-10-CM | POA: Insufficient documentation

## 2018-10-22 DIAGNOSIS — R2241 Localized swelling, mass and lump, right lower limb: Secondary | ICD-10-CM | POA: Diagnosis present

## 2018-10-22 NOTE — ED Notes (Addendum)
Pt is requesting to see MD only. He does not want a PA

## 2018-10-22 NOTE — ED Triage Notes (Signed)
Pt reports a painful lump in his R groin x 3 days. He reports a hx of a hernia. Also states that he has been on flomax recently and he feels like his urine stream is "slowing down." Pt endorses intermittent sharp pain in the area and states walking makes it worse.

## 2018-10-23 ENCOUNTER — Emergency Department (HOSPITAL_COMMUNITY)
Admission: EM | Admit: 2018-10-23 | Discharge: 2018-10-23 | Discharge status: Against Medical Advice | Payer: Medicaid Other | Attending: Emergency Medicine | Admitting: Emergency Medicine

## 2018-11-06 NOTE — Progress Notes (Deleted)
Cardiology Office Note:    Date:  11/06/2018   ID:  Albert Salazar, DOB 26-Feb-1955, MRN [id number]  PCP:  Gilda Crease, MD  Cardiologist:  No primary care provider on file.  Electrophysiologist:  None   Referring MD: Gilda Crease, MD   No chief complaint on file. ***  History of Present Illness:    Albert Salazar is a 63 y.o. male with a hx of psoriasis, polysubstance abuse, bipolar disorder who is referred by Dr. Midge Aver for preop evaluation prior to hernia surgery.  Past Medical History:  Diagnosis Date  . Bipolar disorder (HCC)   . Blindness of left eye   . Cirrhosis of liver (HCC)   . Depression   . Hepatitis C   . Polysubstance abuse [facility])     Past Surgical History:  Procedure Laterality Date  . EYE SURGERY      Current Medications: No outpatient medications have been marked as taking for the 11/10/18 encounter (Appointment) with Little Ishikawa, MD.     Allergies:   Patient has no known allergies.   Social History   Socioeconomic History  . Marital status: Single    Spouse name: Not on file  . Number of children: Not on file  . Years of education: Not on file  . Highest education level: Not on file  Occupational History  . Not on file  Social Needs  . Financial resource strain: Not on file  . Food insecurity    Worry: Not on file    Inability: Not on file  . Transportation needs    Medical: Not on file    Non-medical: Not on file  Tobacco Use  . Smoking status: Current Every Day Smoker    Packs/day: 1.00    Years: 40.00    Pack years: 40.00    Types: Cigarettes  . Smokeless tobacco: Never Used  Substance and Sexual Activity  . Alcohol use: Yes    Alcohol/week: 8.0 standard drinks    Types: 8 Cans of beer per week  . Drug use: Yes    Types: IV, Cocaine  . Sexual activity: Not on file  Lifestyle  . Physical activity    Days per week: Not on file    Minutes per session: Not on file  . Stress: Not on file  Relationships   . Social Musician on phone: Not on file    Gets together: Not on file    Attends religious service: Not on file    Active member of club or organization: Not on file    Attends meetings of clubs or organizations: Not on file    Relationship status: Not on file  Other Topics Concern  . Not on file  Social History Narrative  . Not on file     Family History: The patient's ***family history includes Diabetes in his brother.  ROS:   Please see the history of present illness.    *** All other systems reviewed and are negative.  EKGs/Labs/Other Studies Reviewed:    The following studies were reviewed today: ***  EKG:  EKG is *** ordered today.  The ekg ordered today demonstrates ***  Recent Labs: 05/27/2018: ALT 14; BUN 9; Creatinine, Ser 0.83; Hemoglobin 11.2; Magnesium 1.3; Platelets 45; Potassium 3.7; Sodium 138  Recent Lipid Panel No results found for: CHOL, TRIG, HDL, CHOLHDL, VLDL, LDLCALC, LDLDIRECT  Physical Exam:    VS:  There were no vitals taken for this visit.  Wt Readings from Last 3 Encounters:  10/22/18 163 lb (73.9 kg)  03/06/18 193 lb (87.5 kg)  06/22/14 156 lb 9.6 oz (71 kg)     GEN: *** Well nourished, well developed in no acute distress HEENT: Normal NECK: No JVD; No carotid bruits LYMPHATICS: No lymphadenopathy CARDIAC: ***RRR, no murmurs, rubs, gallops RESPIRATORY:  Clear to auscultation without rales, wheezing or rhonchi  ABDOMEN: Soft, non-tender, non-distended MUSCULOSKELETAL:  No edema; No deformity  SKIN: Warm and dry NEUROLOGIC:  Alert and oriented x 3 PSYCHIATRIC:  Normal affect   ASSESSMENT:    No diagnosis found. PLAN:    In order of problems listed above:  1. ***   Medication Adjustments/Labs and Tests Ordered: Current medicines are reviewed at length with the patient today.  Concerns regarding medicines are outlined above.  No orders of the defined types were placed in this encounter.  No orders of the  defined types were placed in this encounter.   There are no Patient Instructions on file for this visit.   Signed, Donato Heinz, MD  11/06/2018 9:26 PM    [place]

## 2018-11-10 ENCOUNTER — Ambulatory Visit: Payer: Medicaid Other | Admitting: Cardiology

## 2019-05-01 ENCOUNTER — Other Ambulatory Visit: Payer: Self-pay

## 2019-05-01 ENCOUNTER — Encounter (HOSPITAL_COMMUNITY): Payer: Self-pay

## 2019-05-01 ENCOUNTER — Emergency Department (HOSPITAL_COMMUNITY)
Admission: EM | Admit: 2019-05-01 | Discharge: 2019-05-01 | Disposition: A | Discharge status: Home / Self Care | Payer: Medicaid Other | Attending: Emergency Medicine | Admitting: Emergency Medicine

## 2019-05-01 DIAGNOSIS — K5900 Constipation, unspecified: Secondary | ICD-10-CM | POA: Insufficient documentation

## 2019-05-01 DIAGNOSIS — R110 Nausea: Secondary | ICD-10-CM | POA: Insufficient documentation

## 2019-05-01 DIAGNOSIS — R1030 Lower abdominal pain, unspecified: Secondary | ICD-10-CM | POA: Diagnosis not present

## 2019-05-01 DIAGNOSIS — R339 Retention of urine, unspecified: Secondary | ICD-10-CM | POA: Diagnosis present

## 2019-05-01 DIAGNOSIS — F17210 Nicotine dependence, cigarettes, uncomplicated: Secondary | ICD-10-CM | POA: Insufficient documentation

## 2019-05-01 DIAGNOSIS — Z79899 Other long term (current) drug therapy: Secondary | ICD-10-CM | POA: Diagnosis not present

## 2019-05-01 LAB — COMPREHENSIVE METABOLIC PANEL
ALT: 32 U/L (ref 0–44)
AST: 37 U/L (ref 15–41)
Albumin: 3.2 g/dL — ABNORMAL LOW (ref 3.5–5.0)
Alkaline Phosphatase: 113 U/L (ref 38–126)
Anion gap: 8 (ref 5–15)
BUN: 11 mg/dL (ref 8–23)
CO2: 27 mmol/L (ref 22–32)
Calcium: 9 mg/dL (ref 8.9–10.3)
Chloride: 105 mmol/L (ref 98–111)
Creatinine, Ser: 0.67 mg/dL (ref 0.61–1.24)
GFR calc Af Amer: >60 mL/min (ref >60)
GFR calc non Af Amer: >60 mL/min (ref >60)
Glucose, Bld: 114 mg/dL — ABNORMAL HIGH (ref 70–99)
Potassium: 4 mmol/L (ref 3.5–5.1)
Sodium: 140 mmol/L (ref 135–145)
Total Bilirubin: 0.6 mg/dL (ref 0.3–1.2)
Total Protein: 8.1 g/dL (ref 6.5–8.1)

## 2019-05-01 LAB — URINALYSIS, ROUTINE W REFLEX MICROSCOPIC
Bilirubin Urine: NEGATIVE
Glucose, UA: NEGATIVE mg/dL
Hgb urine dipstick: NEGATIVE
Ketones, ur: NEGATIVE mg/dL
Nitrite: NEGATIVE
Protein, ur: NEGATIVE mg/dL
Specific Gravity, Urine: 1.006 (ref 1.005–1.030)
pH: 5 (ref 5.0–8.0)

## 2019-05-01 LAB — CBC
HCT: 43 % (ref 39.0–52.0)
Hemoglobin: 13.9 g/dL (ref 13.0–17.0)
MCH: 32.3 pg (ref 26.0–34.0)
MCHC: 32.3 g/dL (ref 30.0–36.0)
MCV: 100 fL (ref 80.0–100.0)
Platelets: 124 10*3/uL — ABNORMAL LOW (ref 150–400)
RBC: 4.3 MIL/uL (ref 4.22–5.81)
RDW: 13.7 % (ref 11.5–15.5)
WBC: 6.3 10*3/uL (ref 4.0–10.5)
nRBC: 0 % (ref 0.0–0.2)

## 2019-05-01 LAB — LIPASE, BLOOD: Lipase: 37 U/L (ref 11–51)

## 2019-05-01 MED ORDER — SODIUM CHLORIDE 0.9% FLUSH
3.0000 mL | Freq: Once | INTRAVENOUS | Status: AC
  Administered 2019-05-01: 3 mL via INTRAVENOUS

## 2019-05-01 MED ORDER — KETOROLAC TROMETHAMINE 30 MG/ML IJ SOLN
30.0000 mg | Freq: Once | INTRAMUSCULAR | Status: AC
  Administered 2019-05-01: 15:00:00 30 mg via INTRAVENOUS
  Filled 2019-05-01: qty 1

## 2019-05-01 NOTE — ED Notes (Signed)
Pt is getting very irate about wanting pain meds, security is on standby and PA Greta Doom was made aware of pain med

## 2019-05-01 NOTE — ED Notes (Signed)
Leg bag placed on patients right leg. Instructions reviewed. Patient verbalized understanding.   Patient knows to call urology office.

## 2019-05-01 NOTE — ED Triage Notes (Addendum)
Patient reports that he has an abdominal hernia and has not urinated since yesterday.  Patient urinated a small amount in triage.  Patient states he drank "a beer 20 minutes ago." and "cocaine 2 days ago."

## 2019-05-01 NOTE — ED Provider Notes (Signed)
[facility]-EMERGENCY DEPT Provider Note   CSN: [id number] Arrival date & time: 05/01/19  1330     History Chief Complaint  Patient presents with  . Urinary Retention  . Abdominal Pain    Albert Salazar is a 64 y.o. male.  The history is provided by the patient and medical records. No language interpreter was used.  Abdominal Pain    64 year old male with prior history of urinary retention, bipolar disorder, polysubstance abuse including alcohol and cocaine use presenting with complaints of urinary retention.  Patient report normally he would have bowel incontinence whenever he urinated.  States he soiled his self often.  However today he endorsed having difficulty urinating as well as having bowel movement.  Last bowel movement and urination was earlier this morning.  He endorsed abdominal discomfort to his lower abdomen with nausea but without vomiting.  He denies any fever chills any numbness or weakness.  He does have history of inguinal hernia that is recurrent.  States that he does self medicate with alcohol and cocaine last use was yesterday.  Denies any prior abdominal surgeries.  No history of active cancer.  Past Medical History:  Diagnosis Date  . Bipolar disorder (HCC)   . Blindness of left eye   . Cirrhosis of liver (HCC)   . Depression   . Hepatitis C   . Polysubstance abuse [facility])     Patient Active Problem List   Diagnosis Date Noted  . Aspiration pneumonia (HCC) 02/27/2018  . Bacteremia due to Gram-negative bacteria 02/27/2018  . Tachypnea   . Alcohol withdrawal (HCC) 02/20/2018  . Thrombocytopenia (HCC) 06/13/2014  . Alcohol intoxication (HCC) 06/13/2014  . Normocytic anemia 06/13/2014  . Peripheral edema 06/13/2014  . Left epididymitis 06/13/2014  . Dysuria 06/13/2014  . Alcohol abuse   . Alcoholic cirrhosis of liver with ascites (HCC)   . Chronic hepatitis C without hepatic coma (HCC) 06/07/2014  . Hepatic cirrhosis (HCC) 06/07/2014    . Elevated BP 04/21/2014  . Alcohol use disorder, severe, dependence (HCC) 12/30/2013  . Cocaine use disorder, mild, abuse (HCC) 12/30/2013  . Substance induced mood disorder (HCC) 12/30/2013    Past Surgical History:  Procedure Laterality Date  . EYE SURGERY         Family History  Problem Relation Age of Onset  . Diabetes Brother     Social History   Tobacco Use  . Smoking status: Current Every Day Smoker    Packs/day: 1.00    Years: 40.00    Pack years: 40.00    Types: Cigarettes  . Smokeless tobacco: Never Used  Substance Use Topics  . Alcohol use: Yes    Alcohol/week: 8.0 standard drinks    Types: 8 Cans of beer per week  . Drug use: Yes    Types: IV, Cocaine    Home Medications Prior to Admission medications   Medication Sig Start Date End Date Taking? Authorizing Provider  cephALEXin (KEFLEX) 500 MG capsule Take 1 capsule (500 mg total) by mouth 4 (four) times daily. Patient not taking: Reported on 07/02/2018 05/27/18   Lorre Nick, MD  folic acid (FOLVITE) 1 MG tablet Take 1 tablet (1 mg total) by mouth daily. 05/28/18   Ward, Layla Maw, DO  furosemide (LASIX) 20 MG tablet Take 20 mg by mouth daily. 06/18/18   [provider]  spironolactone (ALDACTONE) 50 MG tablet Take 50 mg by mouth daily. 06/18/18   [provider]  tamsulosin (FLOMAX) 0.4 MG CAPS  capsule Take 1 capsule (0.4 mg total) by mouth daily. 03/07/18   Hongalgi, Maximino Greenland, MD  thiamine (VITAMIN B-1) 100 MG tablet Take 1 tablet (100 mg total) by mouth daily. 05/28/18   Ward, Layla Maw, DO    Allergies    Patient has no known allergies.  Review of Systems   Review of Systems  Gastrointestinal: Positive for abdominal pain.  All other systems reviewed and are negative.   Physical Exam Updated Vital Signs BP (!) 154/95 (BP Location: Right Arm)   Pulse 89   Temp (!) 97.5 F (36.4 C) (Oral)   Resp 16   Ht 5\' 9"  (1.753 m)   SpO2 97%   BMI 24.07 kg/m   Physical Exam Vitals  and nursing note reviewed.  Constitutional:      General: He is not in acute distress.    Appearance: He is well-developed.     Comments: Patient resting in bed comfortably in no acute discomfort.  HENT:     Head: Atraumatic.  Eyes:     Conjunctiva/sclera: Conjunctivae normal.     Comments: Blindness in left eye with obvious chronic changes.  Cardiovascular:     Rate and Rhythm: Normal rate and regular rhythm.  Pulmonary:     Effort: Pulmonary effort is normal.     Breath sounds: Normal breath sounds.  Abdominal:     General: Abdomen is flat. Bowel sounds are normal.     Palpations: Abdomen is soft.     Tenderness: There is no abdominal tenderness.     Hernia: A hernia is present. Hernia is present in the right inguinal area.  Genitourinary:    Comments: Chaperone present during exam.  Normal rectal tone, no obvious mass, no impacted stool in rectal vault.  Normal color stool on glove. Musculoskeletal:     Cervical back: Neck supple.  Skin:    Findings: No rash.  Neurological:     Mental Status: He is alert.     ED Results / Procedures / Treatments   Labs (all labs ordered are listed, but only abnormal results are displayed) Labs Reviewed  COMPREHENSIVE METABOLIC PANEL - Abnormal; Notable for the following components:      Result Value   Glucose, Bld 114 (*)    Albumin 3.2 (*)    All other components within normal limits  CBC - Abnormal; Notable for the following components:   Platelets 124 (*)    All other components within normal limits  URINALYSIS, ROUTINE W REFLEX MICROSCOPIC - Abnormal; Notable for the following components:   Leukocytes,Ua MODERATE (*)    Bacteria, UA RARE (*)    All other components within normal limits  LIPASE, BLOOD    EKG None  Radiology No results found.  Procedures Procedures (including critical care time)  Medications Ordered in ED Medications  sodium chloride flush (NS) 0.9 % injection 3 mL (3 mLs Intravenous Given 05/01/19  1452)  ketorolac (TORADOL) 30 MG/ML injection 30 mg (30 mg Intravenous Given 05/01/19 1513)    ED Course  I have reviewed the triage vital signs and the nursing notes.  Pertinent labs & imaging results that were available during my care of the patient were reviewed by me and considered in my medical decision making (see chart for details).    MDM Rules/Calculators/A&P                      BP (!) 154/95 (BP Location: Right Arm)   Pulse 89  Temp (!) 97.5 F (36.4 C) (Oral)   Resp 16   Ht 5\' 9"  (1.753 m)   SpO2 97%   BMI 24.07 kg/m   Final Clinical Impression(s) / ED Diagnoses Final diagnoses:  Urinary retention    Rx / DC Orders ED Discharge Orders    None     2:40 PM Patient with history of urinary retention in the past presenting complaining of bowel constipation and urinary retention since early this morning.  He does have history of prior inguinal hernia with ascites that I was able to reduce without any difficulty.  Initial bladder scan reveals approximately 750 cc of retained urine.  After insertion of Foley, normal color urine were noted in Foley bag.  Patient felt better.  Patient does have significant history of polysubstance abuse including alcohol and cocaine use.  However at this time he is without concerning findings to suggest spinal cord lesion or small bowel obstruction.  3:11 PM Staff report patient was yelling requesting specifically for morphine for his abdominal pain stating "I always get morphine whenever,".  Patient has a benign abdominal exam and her symptoms improved after insertion of Foley putting out adequate amount ofUrine.  UA without signs of urinary tract infection.  Labs are reassuring.  Bowel sounds are present, as I mentioned I have very low suspicion for SBO.  Encourage patient to follow-up with urologist for reassessment and Foley removal next week.  Return precaution discussed.   Domenic Moras, PA-C 05/01/19 1515    Dorie Rank, MD 05/02/19  [phone]

## 2019-05-01 NOTE — ED Notes (Signed)
Patient called out secretary to ask if he left his cell phone.   Patient did not have cell phone. Patient only had cell phone charger. Patient used hospital phone to call his step son for a ride.   Secretary explained to patient he did not have his phone here patient then began to call secretary "a mother fucker" and preceded with more cuss words.

## 2019-05-01 NOTE — Discharge Instructions (Signed)
You have been evaluated for your urinary retention.  Please follow-up closely with urologist next week for reassessment and removal of your Foley.

## 2019-05-14 ENCOUNTER — Emergency Department (HOSPITAL_COMMUNITY)
Admission: EM | Admit: 2019-05-14 | Discharge: 2019-05-14 | Disposition: A | Discharge status: Home / Self Care | Payer: Medicaid Other | Attending: Emergency Medicine | Admitting: Emergency Medicine

## 2019-05-14 ENCOUNTER — Emergency Department (HOSPITAL_COMMUNITY): Payer: Medicaid Other

## 2019-05-14 ENCOUNTER — Encounter (HOSPITAL_COMMUNITY): Payer: Self-pay

## 2019-05-14 ENCOUNTER — Other Ambulatory Visit: Payer: Self-pay

## 2019-05-14 DIAGNOSIS — F17210 Nicotine dependence, cigarettes, uncomplicated: Secondary | ICD-10-CM | POA: Insufficient documentation

## 2019-05-14 DIAGNOSIS — R109 Unspecified abdominal pain: Secondary | ICD-10-CM

## 2019-05-14 DIAGNOSIS — K4090 Unilateral inguinal hernia, without obstruction or gangrene, not specified as recurrent: Secondary | ICD-10-CM | POA: Diagnosis not present

## 2019-05-14 DIAGNOSIS — R102 Pelvic and perineal pain: Secondary | ICD-10-CM | POA: Diagnosis present

## 2019-05-14 MED ORDER — POLYETHYLENE GLYCOL 3350 17 G PO PACK: 17.0000 g | PACK | Freq: Every day | ORAL | 0 refills | Status: DC

## 2019-05-14 NOTE — ED Notes (Signed)
Pt called out and is requesting for his catheter bag to be emptied.  Provided pt with a urinal and he advised he could do it himself but needed BP cuff removed so he could move around. Will check back and replace BP cuff.

## 2019-05-14 NOTE — Discharge Instructions (Signed)
Follow-up with general surgery for here hernia.  I think he can follow-up with them but they will probably want clearance before they would operate.  You may need a hernia truss or hernia underwear to help with this.  They can be purchased at a medical supply store.  I will give you stool softeners daily to help so you do not get constipated.

## 2019-05-14 NOTE — ED Notes (Signed)
EDP at pt bedside 

## 2019-05-14 NOTE — ED Notes (Signed)
Entered pt room to discharge pt. As nurse approached pt with discharge papers, pt snatched papers from nurse and stated "I can fuckin read you don't need to read this shit to me." Nurse told pt it is part of the process for discharge papers to be discussed with pt by nurse/provider prior to pt leaving. Pt refused to sign discharge and stormed out of room.

## 2019-05-14 NOTE — ED Notes (Signed)
Pt walked out of unit saying "bitch cunt mother fucker", security near by, pt walking under his own will

## 2019-05-14 NOTE — ED Triage Notes (Addendum)
Pt arrives [facility] from home. Per EMS: pt reports pelvic pain beginning this am. Pt has a hx of urinary retention. Additionally EMS reports that the pt stated to them "I thought about getting a gun and shooting anybody and everything that moved" As soon as this RN walked into Pt room. Pt states "I need some Morphine or something" Pt additionally states: "I want to die and kill everything"

## 2019-05-14 NOTE — ED Provider Notes (Signed)
[facility] Provider Note   CSN: [id number] Arrival date & time: 05/14/19  1549     History Chief Complaint  Patient presents with  . Pelvic Pain  . Suicidal    Albert Salazar is a 64 y.o. male.  HPI Patient presents with lower abdominal pain and hernia.  Has been having for a while.  States it will come out and get as big as a tennis ball.  No fevers.  No nausea or vomiting.  States the pain gets so severe.  States he threatened a family member because the pain was too bad.  States the pain got so bad that his legs go numb.  States he had said some things about shooting someone or wanting to die but states he does not actually mean it was just due to the pain.  States he does street drugs.  States he sometimes gets morphine for this.  No nausea or vomiting.  Has a Foley catheter in place.  States that he went to see urology but they made him to see a Designer, jewellery.  He did share several of the profanities about his medical care.  States that his primary care doctor wanted him to see cardiology before he could see the surgeons.  No nausea or vomiting.  States he will have some diarrhea at times.  States he is having more trouble doing things because of the hernia.    Past Medical History:  Diagnosis Date  . Bipolar disorder (HCC)   . Blindness of left eye   . Cirrhosis of liver (HCC)   . Depression   . Hepatitis C   . Polysubstance abuse [facility])     Patient Active Problem List   Diagnosis Date Noted  . Aspiration pneumonia (HCC) 02/27/2018  . Bacteremia due to Gram-negative bacteria 02/27/2018  . Tachypnea   . Alcohol withdrawal (HCC) 02/20/2018  . Thrombocytopenia (HCC) 06/13/2014  . Alcohol intoxication (HCC) 06/13/2014  . Normocytic anemia 06/13/2014  . Peripheral edema 06/13/2014  . Left epididymitis 06/13/2014  . Dysuria 06/13/2014  . Alcohol abuse   . Alcoholic cirrhosis of liver with ascites (HCC)   . Chronic hepatitis C  without hepatic coma (HCC) 06/07/2014  . Hepatic cirrhosis (HCC) 06/07/2014  . Elevated BP 04/21/2014  . Alcohol use disorder, severe, dependence (HCC) 12/30/2013  . Cocaine use disorder, mild, abuse (HCC) 12/30/2013  . Substance induced mood disorder (HCC) 12/30/2013    Past Surgical History:  Procedure Laterality Date  . EYE SURGERY         Family History  Problem Relation Age of Onset  . Diabetes Brother     Social History   Tobacco Use  . Smoking status: Current Every Day Smoker    Packs/day: 1.00    Years: 40.00    Pack years: 40.00    Types: Cigarettes  . Smokeless tobacco: Never Used  Substance Use Topics  . Alcohol use: Yes    Alcohol/week: 8.0 standard drinks    Types: 8 Cans of beer per week  . Drug use: Yes    Types: IV, Cocaine    Home Medications Prior to Admission medications   Medication Sig Start Date End Date Taking? Authorizing Provider  ibuprofen (ADVIL) 200 MG tablet Take 200-800 mg by mouth every 6 (six) hours as needed for headache or moderate pain.   Yes [provider]  tamsulosin (FLOMAX) 0.4 MG CAPS capsule Take 1 capsule (0.4 mg total) by mouth daily. 03/07/18  Yes Hongalgi, Lenis Dickinson, MD  cephALEXin (KEFLEX) 500 MG capsule Take 1 capsule (500 mg total) by mouth 4 (four) times daily. Patient not taking: Reported on 07/02/2018 05/27/18   Lacretia Leigh, MD  folic acid (FOLVITE) 1 MG tablet Take 1 tablet (1 mg total) by mouth daily. Patient not taking: Reported on 05/14/2019 05/28/18   Ward, Delice Bison, DO  polyethylene glycol (MIRALAX) 17 g packet Take 17 g by mouth daily. 05/14/19   Davonna Belling, MD  thiamine (VITAMIN B-1) 100 MG tablet Take 1 tablet (100 mg total) by mouth daily. Patient not taking: Reported on 05/14/2019 05/28/18   Ward, Delice Bison, DO    Allergies    Patient has no known allergies.  Review of Systems   Review of Systems  Constitutional: Negative for appetite change.  HENT: Negative for congestion.   Respiratory:  Negative for shortness of breath.   Gastrointestinal: Positive for abdominal pain. Negative for nausea and vomiting.  Genitourinary: Negative for flank pain.  Musculoskeletal: Positive for back pain.  Skin: Negative for wound.  Neurological: Positive for numbness. Negative for weakness.  Psychiatric/Behavioral: Negative for confusion.    Physical Exam Updated Vital Signs BP (!) 141/73   Pulse 85   Temp 97.9 F (36.6 C) (Oral)   Resp 18   SpO2 95%   Physical Exam Vitals and nursing note reviewed.  HENT:     Head: Atraumatic.     Mouth/Throat:     Mouth: Mucous membranes are moist.  Eyes:     Comments: Chronically blind left eye.  Cardiovascular:     Rate and Rhythm: Regular rhythm.  Pulmonary:     Breath sounds: No wheezing or rhonchi.  Abdominal:     Comments: Mild tenderness to right inguinal area without frank hernia at this time.  Genitourinary:    Comments: Mild tenderness in the right hemiscrotum without hernia palpated.  Foley catheter in place with leg bag. Musculoskeletal:     Comments: Able to move both lower extremities.  Sensation grossly intact.  Has been able to ambulate.  Skin:    General: Skin is warm.     Capillary Refill: Capillary refill takes less than 2 seconds.  Neurological:     Mental Status: He is alert and oriented to person, place, and time.     ED Results / Procedures / Treatments   Labs (all labs ordered are listed, but only abnormal results are displayed) Labs Reviewed - No data to display  EKG None  Radiology DG Abd 2 Views  Result Date: 05/14/2019 CLINICAL DATA:  64 year old with acute onset of pelvic pain that began this morning. EXAM: ABDOMEN - 2 VIEW COMPARISON:  CT abdomen and pelvis 05/27/2018. FINDINGS: Bowel gas pattern unremarkable without evidence of obstruction or significant ileus. No evidence of free air or significant air-fluid levels on the erect image. Scattered colonic air-fluid levels indicate liquid stool.  Expected stool burden in the colon. Phleboliths in the pelvis. No visible opaque urinary tract calculi. Aorto-iliofemoral atherosclerosis. Regional skeleton unremarkable. IMPRESSION: No acute abdominal abnormality. Electronically Signed   By: Evangeline Dakin M.D.   On: 05/14/2019 18:31    Procedures Procedures (including critical care time)  Medications Ordered in ED Medications - No data to display  ED Course  I have reviewed the triage vital signs and the nursing notes.  Pertinent labs & imaging results that were available during my care of the patient were reviewed by me and considered in my medical decision making (see  chart for details).    MDM Rules/Calculators/A&P                      *Patient presents with abdominal pain related to inguinal hernia.  Reducible here.  Mild tenderness without obstruction.  Has catheter in place.  Back pain improved after discussion.  Able ambulate and does not have weakness in the legs.  Doubt severe back pathology such as epidural abscess.  Think it was related abdominal pain.  Long discussion about hernias and follow-up.  Given stool softeners and discussed hernia truss.  Follow-up as an outpatient with general surgery. Final Clinical Impression(s) / ED Diagnoses Final diagnoses:  Abdominal pain  Unilateral inguinal hernia without obstruction or gangrene, recurrence not specified    Rx / DC Orders ED Discharge Orders         Ordered    polyethylene glycol (MIRALAX) 17 g packet  Daily     05/14/19 1847           Benjiman Core, MD 05/14/19 1902

## 2019-06-14 ENCOUNTER — Emergency Department (HOSPITAL_COMMUNITY)
Admission: EM | Admit: 2019-06-14 | Discharge: 2019-06-14 | Disposition: A | Discharge status: Home / Self Care | Payer: Medicaid Other | Attending: Emergency Medicine | Admitting: Emergency Medicine

## 2019-06-14 DIAGNOSIS — F17210 Nicotine dependence, cigarettes, uncomplicated: Secondary | ICD-10-CM | POA: Insufficient documentation

## 2019-06-14 DIAGNOSIS — Y69 Unspecified misadventure during surgical and medical care: Secondary | ICD-10-CM | POA: Diagnosis not present

## 2019-06-14 DIAGNOSIS — T83098A Other mechanical complication of other indwelling urethral catheter, initial encounter: Secondary | ICD-10-CM | POA: Insufficient documentation

## 2019-06-14 DIAGNOSIS — T839XXA Unspecified complication of genitourinary prosthetic device, implant and graft, initial encounter: Secondary | ICD-10-CM

## 2019-06-14 NOTE — ED Triage Notes (Signed)
Pt reports he has a foley catheter in place, reports he somehow ripped his leg bag last night, replaced it with a standard drainage bag he had but would like to have leg bag replaced. Seeing urology tmrw regarding catheter, denies any issues other than leg bag

## 2019-06-14 NOTE — ED Notes (Signed)
Foley leg bag replaced.

## 2019-06-14 NOTE — ED Provider Notes (Signed)
[facility] EMERGENCY DEPARTMENT Provider Note   CSN: [id number] Arrival date & time: 06/14/19  1122     History Chief Complaint  Patient presents with  . urinary catheter bag replacement    Albert Salazar is a 64 y.o. male.  HPI     Presents with need for foley leg bag to be changed.  Had catheter in place and somehow last night ripped his leg bag.  Had a standard drainage bag but came to ED to have leg bag placed. No abdominal pain, no flank pain, no nausea/vomiting, no fevers.  No other concerns.  Past Medical History:  Diagnosis Date  . Bipolar disorder (HCC)   . Blindness of left eye   . Cirrhosis of liver (HCC)   . Depression   . Hepatitis C   . Polysubstance abuse [facility])     Patient Active Problem List   Diagnosis Date Noted  . Aspiration pneumonia (HCC) 02/27/2018  . Bacteremia due to Gram-negative bacteria 02/27/2018  . Tachypnea   . Alcohol withdrawal (HCC) 02/20/2018  . Thrombocytopenia (HCC) 06/13/2014  . Alcohol intoxication (HCC) 06/13/2014  . Normocytic anemia 06/13/2014  . Peripheral edema 06/13/2014  . Left epididymitis 06/13/2014  . Dysuria 06/13/2014  . Alcohol abuse   . Alcoholic cirrhosis of liver with ascites (HCC)   . Chronic hepatitis C without hepatic coma (HCC) 06/07/2014  . Hepatic cirrhosis (HCC) 06/07/2014  . Elevated BP 04/21/2014  . Alcohol use disorder, severe, dependence (HCC) 12/30/2013  . Cocaine use disorder, mild, abuse (HCC) 12/30/2013  . Substance induced mood disorder (HCC) 12/30/2013    Past Surgical History:  Procedure Laterality Date  . EYE SURGERY         Family History  Problem Relation Age of Onset  . Diabetes Brother     Social History   Tobacco Use  . Smoking status: Current Every Day Smoker    Packs/day: 1.00    Years: 40.00    Pack years: 40.00    Types: Cigarettes  . Smokeless tobacco: Never Used  Substance Use Topics  . Alcohol use: Yes    Alcohol/week: 8.0 standard drinks     Types: 8 Cans of beer per week  . Drug use: Yes    Types: IV, Cocaine    Home Medications Prior to Admission medications   Medication Sig Start Date End Date Taking? Authorizing Provider  cephALEXin (KEFLEX) 500 MG capsule Take 1 capsule (500 mg total) by mouth 4 (four) times daily. Patient not taking: Reported on 07/02/2018 05/27/18   Lorre Nick, MD  folic acid (FOLVITE) 1 MG tablet Take 1 tablet (1 mg total) by mouth daily. Patient not taking: Reported on 05/14/2019 05/28/18   Ward, Layla Maw, DO  ibuprofen (ADVIL) 200 MG tablet Take 200-800 mg by mouth every 6 (six) hours as needed for headache or moderate pain.    [provider]  polyethylene glycol (MIRALAX) 17 g packet Take 17 g by mouth daily. 05/14/19   Benjiman Core, MD  tamsulosin (FLOMAX) 0.4 MG CAPS capsule Take 1 capsule (0.4 mg total) by mouth daily. 03/07/18   Hongalgi, Maximino Greenland, MD  thiamine (VITAMIN B-1) 100 MG tablet Take 1 tablet (100 mg total) by mouth daily. Patient not taking: Reported on 05/14/2019 05/28/18   Ward, Layla Maw, DO    Allergies    Patient has no known allergies.  Review of Systems   Review of Systems  Constitutional: Negative for fever.  Gastrointestinal: Negative for abdominal  pain, diarrhea, nausea and vomiting.  Genitourinary: Positive for difficulty urinating (foley in place). Negative for flank pain and hematuria.  Skin: Negative for rash.    Physical Exam Updated Vital Signs BP (!) 173/108 (BP Location: Left Arm)   Pulse (!) 110   Temp 98.4 F (36.9 C) (Oral)   Resp 20   SpO2 95%   Physical Exam Vitals and nursing note reviewed.  Constitutional:      General: He is not in acute distress.    Appearance: Normal appearance. He is not ill-appearing, toxic-appearing or diaphoretic.  HENT:     Head: Normocephalic.  Eyes:     Conjunctiva/sclera: Conjunctivae normal.  Cardiovascular:     Rate and Rhythm: Normal rate and regular rhythm.     Pulses: Normal pulses.    Pulmonary:     Effort: Pulmonary effort is normal. No respiratory distress.  Abdominal:     General: Abdomen is flat.     Palpations: There is no mass.     Tenderness: There is no abdominal tenderness. There is no right CVA tenderness or left CVA tenderness.     Hernia: No hernia is present.  Musculoskeletal:        General: No deformity or signs of injury.     Cervical back: No rigidity.  Skin:    General: Skin is warm and dry.     Coloration: Skin is not jaundiced or pale.  Neurological:     General: No focal deficit present.     Mental Status: He is alert and oriented to person, place, and time.     ED Results / Procedures / Treatments   Labs (all labs ordered are listed, but only abnormal results are displayed) Labs Reviewed - No data to display  EKG None  Radiology No results found.  Procedures Procedures (including critical care time)  Medications Ordered in ED Medications - No data to display  ED Course  I have reviewed the triage vital signs and the nursing notes.  Pertinent labs & imaging results that were available during my care of the patient were reviewed by me and considered in my medical decision making (see chart for details).    MDM Rules/Calculators/A&P                      64yo male presents with desire for foley cathter leg bag replacement after ripping leg bag last night.  Bag replaced with nursing and med tech.  No other concerns on history or exam. Following up with Urology. Patient discharged in stable condition with understanding of reasons to return.     Final Clinical Impression(s) / ED Diagnoses Final diagnoses:  Problem with Foley catheter, initial encounter [facility])    Rx / DC Orders ED Discharge Orders    None       Gareth Morgan, MD 06/14/19 2236

## 2019-07-20 ENCOUNTER — Other Ambulatory Visit: Payer: Self-pay

## 2019-07-20 ENCOUNTER — Emergency Department (HOSPITAL_COMMUNITY)
Admission: EM | Admit: 2019-07-20 | Discharge: 2019-07-20 | Disposition: A | Discharge status: Home / Self Care | Payer: Medicaid Other | Attending: Emergency Medicine | Admitting: Emergency Medicine

## 2019-07-20 DIAGNOSIS — T83098A Other mechanical complication of other indwelling urethral catheter, initial encounter: Secondary | ICD-10-CM | POA: Diagnosis not present

## 2019-07-20 DIAGNOSIS — T839XXA Unspecified complication of genitourinary prosthetic device, implant and graft, initial encounter: Secondary | ICD-10-CM

## 2019-07-20 DIAGNOSIS — F17210 Nicotine dependence, cigarettes, uncomplicated: Secondary | ICD-10-CM | POA: Diagnosis not present

## 2019-07-20 DIAGNOSIS — Y732 Prosthetic and other implants, materials and accessory gastroenterology and urology devices associated with adverse incidents: Secondary | ICD-10-CM | POA: Insufficient documentation

## 2019-07-20 DIAGNOSIS — R339 Retention of urine, unspecified: Secondary | ICD-10-CM | POA: Diagnosis present

## 2019-07-20 DIAGNOSIS — F1910 Other psychoactive substance abuse, uncomplicated: Secondary | ICD-10-CM | POA: Diagnosis not present

## 2019-07-20 MED ORDER — CEPHALEXIN 500 MG PO CAPS
1000.0000 mg | ORAL_CAPSULE | Freq: Once | ORAL | Status: AC
  Administered 2019-07-20: 1000 mg via ORAL
  Filled 2019-07-20: qty 2

## 2019-07-20 MED ORDER — CEPHALEXIN 500 MG PO CAPS: 500.0000 mg | ORAL_CAPSULE | Freq: Two times a day (BID) | ORAL | 0 refills | Status: DC

## 2019-07-20 NOTE — ED Triage Notes (Signed)
PER EMS: Pt is coming from home with c/o catheter leaking. Pt has a chronic foley and is  refusing to give any medical history. Colostomy bag.   BP 202/110 HR 120 RR 17 SPO2 96%

## 2019-07-20 NOTE — ED Provider Notes (Signed)
[facility]-EMERGENCY DEPT Provider Note: Lowella Dell, MD, FACEP  CSN: [id number] MRN: [id number] ARRIVAL: 07/20/19 at 0118 ROOM: RESB/RESB   CHIEF COMPLAINT  Leaking around Foley   HISTORY OF PRESENT ILLNESS  07/20/19 2:41 AM Albert Salazar is a 64 y.o. male who has been dealing with urinary retention issues and a Foley catheter for 2 years.  He states he has [facility] and has had multiple visits to the ED for attention to his Foley.  He is here complaining of leaking around his catheter, discomfort in his penis due to the catheter as well as burning which makes him think he may have a urinary tract infection.  He states he is tired of dealing with this "m-----f------ thing" and wants it removed.  He states he does not care if he might become obstructed again requiring replacement of the Foley.   Past Medical History:  Diagnosis Date  . Bipolar disorder (HCC)   . Blindness of left eye   . Cirrhosis of liver (HCC)   . Depression   . Hepatitis C   . Polysubstance abuse [facility])     Past Surgical History:  Procedure Laterality Date  . EYE SURGERY      Family History  Problem Relation Age of Onset  . Diabetes Brother     Social History   Tobacco Use  . Smoking status: Current Every Day Smoker    Packs/day: 1.00    Years: 40.00    Pack years: 40.00    Types: Cigarettes  . Smokeless tobacco: Never Used  Vaping Use  . Vaping Use: Never used  Substance Use Topics  . Alcohol use: Yes    Alcohol/week: 8.0 standard drinks    Types: 8 Cans of beer per week  . Drug use: Yes    Types: IV, Cocaine    Prior to Admission medications   Medication Sig Start Date End Date Taking? Authorizing Provider  cephALEXin (KEFLEX) 500 MG capsule Take 1 capsule (500 mg total) by mouth 2 (two) times daily. 07/20/19   Rashonda Warrior, MD  ibuprofen (ADVIL) 200 MG tablet Take 200-800 mg by mouth every 6 (six) hours as needed for headache or moderate pain.    [provider]    polyethylene glycol (MIRALAX) 17 g packet Take 17 g by mouth daily. 05/14/19   Benjiman Core, MD  tamsulosin (FLOMAX) 0.4 MG CAPS capsule Take 1 capsule (0.4 mg total) by mouth daily. 03/07/18   Hongalgi, Maximino Greenland, MD    Allergies Patient has no known allergies.   REVIEW OF SYSTEMS  Negative except as noted here or in the History of Present Illness.   PHYSICAL EXAMINATION  Initial Vital Signs Blood pressure (!) 166/94, pulse (!) 105, temperature 98.2 F (36.8 C), temperature source Oral, resp. rate 16, height 5\' 9"  (1.753 m), weight 72.6 kg, SpO2 96 %.  Examination General: Well-developed, well-nourished male in no acute distress; appearance consistent with age of record HENT: normocephalic; atraumatic Eyes: Normal-appearing right eyes; blind left eye with opaque cornea Neck: supple Heart: regular rate and rhythm Lungs: clear to auscultation bilaterally Abdomen: soft; nondistended; nontender; bowel sounds present GU: Tanner V male, circumcised; Foley catheter in place draining clear amber urine Extremities: No deformity; full range of motion; pulses normal Neurologic: Awake, alert and oriented; motor function intact in all extremities and symmetric; no facial droop Skin: Warm and dry Psychiatric: Normal mood and affect but becomes angry when discussing Foley catheter   RESULTS  Summary of this visit's results, reviewed and interpreted by myself:   EKG Interpretation  Date/Time:    Ventricular Rate:    PR Interval:    QRS Duration:   QT Interval:    QTC Calculation:   R Axis:     Text Interpretation:        Laboratory Studies: No results found for this or any previous visit (from the past 24 hour(s)). Imaging Studies: No results found.  ED COURSE and MDM  Nursing notes, initial and subsequent vitals signs, including pulse oximetry, reviewed and interpreted by myself.  Vitals:   07/20/19 0124 07/20/19 0230  BP: (!) 166/94 133/78  Pulse: (!) 105 84  Resp: 16  16  Temp: 98.2 F (36.8 C)   TempSrc: Oral   SpO2: 96% 95%  Weight: 72.6 kg   Height: 5\' 9"  (1.753 m)    Medications  cephALEXin (KEFLEX) capsule 1,000 mg (has no administration in time range)    Foley catheter removed without complication.  Patient able to void after removal of Foley.  Urine is blood-tinged.  Will send for culture.  PROCEDURES  Procedures   ED DIAGNOSES     ICD-10-CM   1. Complication of Foley catheter, initial encounter ([place])  T83.9XXA        Jamieka Royle, Jenny Reichmann, MD 07/20/19 [phone]

## 2019-07-22 LAB — URINE CULTURE: Culture: >=100000 — AB

## 2019-07-23 ENCOUNTER — Telehealth: Payer: Self-pay | Admitting: Emergency Medicine

## 2019-07-23 NOTE — Telephone Encounter (Signed)
Post ED Visit - Positive Culture Follow-up  Culture report reviewed by antimicrobial stewardship pharmacist: [facility] []  , Pharm.D. []  Enzo Bi, Pharm.D., BCPS AQ-ID []  , Pharm.D., BCPS []  Celedonio Miyamoto, .D., BCPS []  [place], .D., BCPS, AAHIVP []  Georgina Pillion, Pharm.D., BCPS, AAHIVP []  [address], PharmD, BCPS []  , PharmD, BCPS []  [place], PharmD, BCPS []  [place], PharmD []  , PharmD, BCPS []  Estella Husk, PharmD  Pharmacy Team []  Lysle Pearl, PharmD []  , PharmD []  Phillips Climes, PharmD []  , Rph []  Agapito Games) , PharmD []  Verlan Friends, PharmD []  , PharmD [x]  Mervyn Gay, PharmD []  , PharmD []  Vinnie Level, PharmD []  [facility], PharmD []  , PharmD []  Len Childs, PharmD   Positive urine culture Treated with cephalexin, organism sensitive to the same and no further patient follow-up is required at this time.  07/23/2019, 3:24 PM

## 2019-12-27 ENCOUNTER — Emergency Department (HOSPITAL_COMMUNITY): Payer: Medicaid Other

## 2019-12-27 ENCOUNTER — Emergency Department (HOSPITAL_COMMUNITY)
Admission: EM | Admit: 2019-12-27 | Discharge: 2019-12-28 | Disposition: A | Discharge status: Home / Self Care | Payer: Medicaid Other | Attending: Emergency Medicine | Admitting: Emergency Medicine

## 2019-12-27 ENCOUNTER — Other Ambulatory Visit: Payer: Self-pay

## 2019-12-27 ENCOUNTER — Encounter (HOSPITAL_COMMUNITY): Payer: Self-pay | Admitting: Emergency Medicine

## 2019-12-27 DIAGNOSIS — Y92009 Unspecified place in unspecified non-institutional (private) residence as the place of occurrence of the external cause: Secondary | ICD-10-CM | POA: Diagnosis not present

## 2019-12-27 DIAGNOSIS — M25462 Effusion, left knee: Secondary | ICD-10-CM | POA: Diagnosis not present

## 2019-12-27 DIAGNOSIS — F17210 Nicotine dependence, cigarettes, uncomplicated: Secondary | ICD-10-CM | POA: Insufficient documentation

## 2019-12-27 DIAGNOSIS — M79605 Pain in left leg: Secondary | ICD-10-CM | POA: Diagnosis present

## 2019-12-27 DIAGNOSIS — W11XXXA Fall on and from ladder, initial encounter: Secondary | ICD-10-CM | POA: Insufficient documentation

## 2019-12-27 DIAGNOSIS — Y93H9 Activity, other involving exterior property and land maintenance, building and construction: Secondary | ICD-10-CM | POA: Diagnosis not present

## 2019-12-27 MED ORDER — OXYCODONE-ACETAMINOPHEN 5-325 MG PO TABS
2.0000 | ORAL_TABLET | Freq: Once | ORAL | Status: AC
  Administered 2019-12-27: 2 via ORAL
  Filled 2019-12-27: qty 2

## 2019-12-27 MED ORDER — KETOROLAC TROMETHAMINE 60 MG/2ML IM SOLN
60.0000 mg | Freq: Once | INTRAMUSCULAR | Status: AC
  Administered 2019-12-27: 60 mg via INTRAMUSCULAR
  Filled 2019-12-27: qty 2

## 2019-12-27 MED ORDER — OXYCODONE-ACETAMINOPHEN 5-325 MG PO TABS: 1.0000 | ORAL_TABLET | ORAL | 0 refills | Status: DC | PRN

## 2019-12-27 MED ORDER — HYDROMORPHONE HCL 1 MG/ML IJ SOLN
1.0000 mg | Freq: Once | INTRAMUSCULAR | Status: AC
  Administered 2019-12-27: 1 mg via INTRAMUSCULAR
  Filled 2019-12-27: qty 1

## 2019-12-27 NOTE — ED Provider Notes (Signed)
[facility]-EMERGENCY DEPT Provider Note   CSN: [id number] Arrival date & time: 12/27/19  2012     History Chief Complaint  Patient presents with  . Leg Pain    Albert Salazar is a 64 y.o. male.  HPI Patient reports he was painting his garage today.  He reports he was using a 4 foot ladder and climbing up and down a number of times.  He reports it went well.  He was not having any difficulty.  He walked into the house.  He sat down and he walked back and forth to the fridge a couple times.  He reports that he was watching a movie and he went to stand up and left his leg hurt so badly he could not stand up.  He reports the pain is in the left leg he indicates from his mid thigh through the top of the knee and reports that it makes the lower part of the leg feel kind of numb.  He denies there was a pain in the hip.  No pain in the back of the buttock.  He reports any movements of the knee now are exquisitely painful.  Ports is very painful to get the front of his knee touched.  He denies ever having similar problem.    Past Medical History:  Diagnosis Date  . Bipolar disorder (HCC)   . Blindness of left eye   . Cirrhosis of liver (HCC)   . Depression   . Hepatitis C   . Polysubstance abuse [facility])     Patient Active Problem List   Diagnosis Date Noted  . Aspiration pneumonia (HCC) 02/27/2018  . Bacteremia due to Gram-negative bacteria 02/27/2018  . Tachypnea   . Alcohol withdrawal (HCC) 02/20/2018  . Thrombocytopenia (HCC) 06/13/2014  . Alcohol intoxication (HCC) 06/13/2014  . Normocytic anemia 06/13/2014  . Peripheral edema 06/13/2014  . Left epididymitis 06/13/2014  . Dysuria 06/13/2014  . Alcohol abuse   . Alcoholic cirrhosis of liver with ascites (HCC)   . Chronic hepatitis C without hepatic coma (HCC) 06/07/2014  . Hepatic cirrhosis (HCC) 06/07/2014  . Elevated BP 04/21/2014  . Alcohol use disorder, severe, dependence (HCC) 12/30/2013  . Cocaine use  disorder, mild, abuse (HCC) 12/30/2013  . Substance induced mood disorder (HCC) 12/30/2013    Past Surgical History:  Procedure Laterality Date  . EYE SURGERY         Family History  Problem Relation Age of Onset  . Diabetes Brother     Social History   Tobacco Use  . Smoking status: Current Every Day Smoker    Packs/day: 1.00    Years: 40.00    Pack years: 40.00    Types: Cigarettes  . Smokeless tobacco: Never Used  Vaping Use  . Vaping Use: Never used  Substance Use Topics  . Alcohol use: Yes    Alcohol/week: 8.0 standard drinks    Types: 8 Cans of beer per week  . Drug use: Yes    Types: IV, Cocaine    Home Medications Prior to Admission medications   Medication Sig Start Date End Date Taking? Authorizing Provider  cephALEXin (KEFLEX) 500 MG capsule Take 1 capsule (500 mg total) by mouth 2 (two) times daily. 07/20/19   Molpus, John, MD  ibuprofen (ADVIL) 200 MG tablet Take 200-800 mg by mouth every 6 (six) hours as needed for headache or moderate pain.    [provider]  oxyCODONE-acetaminophen (PERCOCET) 5-325 MG tablet Take 1-2  tablets by mouth every 4 (four) hours as needed. 12/27/19   Arby Barrette, MD  polyethylene glycol (MIRALAX) 17 g packet Take 17 g by mouth daily. 05/14/19   Benjiman Core, MD  tamsulosin (FLOMAX) 0.4 MG CAPS capsule Take 1 capsule (0.4 mg total) by mouth daily. 03/07/18   Hongalgi, Maximino Greenland, MD    Allergies    Patient has no known allergies.  Review of Systems   Review of Systems 10 systems reviewed and negative except as per HPI Physical Exam Updated Vital Signs BP 95/71   Pulse (!) 102   Temp 97.9 F (36.6 C)   Resp 18   SpO2 98%   Physical Exam Constitutional:      Appearance: Normal appearance.  Eyes:     Extraocular Movements: Extraocular movements intact.  Pulmonary:     Effort: Pulmonary effort is normal.  Musculoskeletal:     Comments: Pain with any range of motion at the left leg.  Pain focuses at  the anterior knee.  Pain to palpation over the superior patella.  Effusion present.  No erythema.  No pain to compression of the soft tissues of the posterior thigh or calf.  Skin:    General: Skin is warm and dry.  Neurological:     General: No focal deficit present.     Mental Status: He is alert and oriented to person, place, and time.     Coordination: Coordination normal.     ED Results / Procedures / Treatments   Labs (all labs ordered are listed, but only abnormal results are displayed) Labs Reviewed - No data to display  EKG None  Radiology DG Knee Complete 4 Views Left  Result Date: 12/27/2019 CLINICAL DATA:  Left knee pain. EXAM: LEFT KNEE - COMPLETE 4+ VIEW COMPARISON:  None. FINDINGS: No evidence of acute fracture or dislocation. No evidence of arthropathy or other focal bone abnormality. A small to moderate sized joint effusion is seen. There is marked severity vascular calcification. IMPRESSION: Small to moderate sized joint effusion without evidence of acute osseous abnormality. Electronically Signed   By: Aram Candela M.D.   On: 12/27/2019 23:08    Procedures Procedures (including critical care time)  Medications Ordered in ED Medications  HYDROmorphone (DILAUDID) injection 1 mg (1 mg Intramuscular Given 12/27/19 2206)  ketorolac (TORADOL) injection 60 mg (60 mg Intramuscular Given 12/27/19 2206)  oxyCODONE-acetaminophen (PERCOCET/ROXICET) 5-325 MG per tablet 2 tablet (2 tablets Oral Given 12/27/19 2359)    ED Course  I have reviewed the triage vital signs and the nursing notes.  Pertinent labs & imaging results that were available during my care of the patient were reviewed by me and considered in my medical decision making (see chart for details).    MDM Rules/Calculators/A&P                          Patient presents with severe pain which is in the knee.  Knee has a mild effusion that is not red.  No pain behind the knee or calf to suggest DVT.   Patient had stepped up and down off the ladder multiple times today.  Consistent with overuse arthropathy with effusion.  Patient treated with IM Dilaudid and Toradol with significant improvement in pain.  At this time, opted for compression icing and elevating for management.  Patient is instructed to follow-up with orthopedics for recheck.  Return precautions reviewed. Final Clinical Impression(s) / ED Diagnoses Final diagnoses:  Effusion of left knee    Rx / DC Orders ED Discharge Orders         Ordered    oxyCODONE-acetaminophen (PERCOCET) 5-325 MG tablet  Every 4 hours PRN        12/27/19 2343           Arby Barrette, MD 12/28/19 0009

## 2019-12-27 NOTE — ED Triage Notes (Signed)
Per EMS, patient from home, c/o left leg pain after climbing up and down a ladder while painting today. Denies fall or injury.

## 2019-12-28 NOTE — ED Notes (Signed)
Crutches and Ace bandage applied to patient.

## 2020-02-22 ENCOUNTER — Encounter (HOSPITAL_COMMUNITY): Payer: Self-pay | Admitting: Obstetrics and Gynecology

## 2020-02-22 ENCOUNTER — Emergency Department (HOSPITAL_COMMUNITY): Payer: Medicaid Other

## 2020-02-22 ENCOUNTER — Other Ambulatory Visit: Payer: Self-pay

## 2020-02-22 DIAGNOSIS — F17210 Nicotine dependence, cigarettes, uncomplicated: Secondary | ICD-10-CM | POA: Insufficient documentation

## 2020-02-22 DIAGNOSIS — N10 Acute tubulo-interstitial nephritis: Secondary | ICD-10-CM | POA: Insufficient documentation

## 2020-02-22 DIAGNOSIS — M549 Dorsalgia, unspecified: Secondary | ICD-10-CM | POA: Diagnosis present

## 2020-02-22 NOTE — ED Triage Notes (Signed)
Patient reports to the ER for back pain. Patient reports he woke up with back pain x5 days ago and has not taken anything for the back pain.

## 2020-02-23 ENCOUNTER — Emergency Department (HOSPITAL_COMMUNITY)
Admission: EM | Admit: 2020-02-23 | Discharge: 2020-02-23 | Disposition: A | Discharge status: Home / Self Care | Payer: Medicaid Other | Attending: Emergency Medicine | Admitting: Emergency Medicine

## 2020-02-23 DIAGNOSIS — N12 Tubulo-interstitial nephritis, not specified as acute or chronic: Secondary | ICD-10-CM

## 2020-02-23 LAB — URINALYSIS, ROUTINE W REFLEX MICROSCOPIC
Bilirubin Urine: NEGATIVE
Glucose, UA: NEGATIVE mg/dL
Ketones, ur: 5 mg/dL — AB
Nitrite: NEGATIVE
Protein, ur: 30 mg/dL — AB
Specific Gravity, Urine: 1.021 (ref 1.005–1.030)
WBC, UA: >50 WBC/hpf — ABNORMAL HIGH (ref 0–5)
pH: 5 (ref 5.0–8.0)

## 2020-02-23 MED ORDER — CEPHALEXIN 500 MG PO CAPS
500.0000 mg | ORAL_CAPSULE | Freq: Once | ORAL | Status: AC
  Administered 2020-02-23: 500 mg via ORAL
  Filled 2020-02-23: qty 1

## 2020-02-23 MED ORDER — CEPHALEXIN 500 MG PO CAPS: 500.0000 mg | ORAL_CAPSULE | Freq: Two times a day (BID) | ORAL | 0 refills | Status: AC

## 2020-02-23 MED ORDER — KETOROLAC TROMETHAMINE 30 MG/ML IJ SOLN
30.0000 mg | Freq: Once | INTRAMUSCULAR | Status: AC
  Administered 2020-02-23: 30 mg via INTRAMUSCULAR
  Filled 2020-02-23: qty 1

## 2020-02-23 MED ORDER — IBUPROFEN 200 MG PO TABS: 400.0000 mg | ORAL_TABLET | Freq: Three times a day (TID) | ORAL | 0 refills | Status: AC

## 2020-02-23 NOTE — ED Provider Notes (Signed)
[facility]-EMERGENCY DEPT Provider Note   CSN: [id number] Arrival date & time: 02/22/20  2328     History Chief Complaint  Patient presents with  . Back Pain    Albert Salazar is a 65 y.o. male.  HPI Patient with history of multiple medical issues, but was previously well, presents with 5 days of back pain.  Onset was atraumatic, without clear precipitant.  Initially was left flank pain.  In the interval patient has developed pain across the back from the left to the right flank.  No chest pain, no abdominal pain, no nausea vomiting, diarrhea, fever, chills. No dyspnea, no cough. No rash. No medication taken for relief.  Pain is sore, persistent, moderate. He notes a history of prior urinary catheterization as well as walking pneumonia, hypothesizes that either of these etiologies may be recurrent. He smokes cigarettes.    Past Medical History:  Diagnosis Date  . Bipolar disorder (HCC)   . Blindness of left eye   . Cirrhosis of liver (HCC)   . Depression   . Hepatitis C   . Polysubstance abuse [facility])     Patient Active Problem List   Diagnosis Date Noted  . Aspiration pneumonia (HCC) 02/27/2018  . Bacteremia due to Gram-negative bacteria 02/27/2018  . Tachypnea   . Alcohol withdrawal (HCC) 02/20/2018  . Thrombocytopenia (HCC) 06/13/2014  . Alcohol intoxication (HCC) 06/13/2014  . Normocytic anemia 06/13/2014  . Peripheral edema 06/13/2014  . Left epididymitis 06/13/2014  . Dysuria 06/13/2014  . Alcohol abuse   . Alcoholic cirrhosis of liver with ascites (HCC)   . Chronic hepatitis C without hepatic coma (HCC) 06/07/2014  . Hepatic cirrhosis (HCC) 06/07/2014  . Elevated BP 04/21/2014  . Alcohol use disorder, severe, dependence (HCC) 12/30/2013  . Cocaine use disorder, mild, abuse (HCC) 12/30/2013  . Substance induced mood disorder (HCC) 12/30/2013    Past Surgical History:  Procedure Laterality Date  . EYE SURGERY         Family History   Problem Relation Age of Onset  . Diabetes Brother     Social History   Tobacco Use  . Smoking status: Current Every Day Smoker    Packs/day: 1.00    Years: 40.00    Pack years: 40.00    Types: Cigarettes  . Smokeless tobacco: Never Used  Vaping Use  . Vaping Use: Never used  Substance Use Topics  . Alcohol use: Yes    Alcohol/week: 8.0 standard drinks    Types: 8 Cans of beer per week  . Drug use: Yes    Types: IV, Cocaine    Home Medications Prior to Admission medications   Medication Sig Start Date End Date Taking? Authorizing Provider  cephALEXin (KEFLEX) 500 MG capsule Take 1 capsule (500 mg total) by mouth 2 (two) times daily. 07/20/19   Molpus, John, MD  ibuprofen (ADVIL) 200 MG tablet Take 200-800 mg by mouth every 6 (six) hours as needed for headache or moderate pain.    [provider]  oxyCODONE-acetaminophen (PERCOCET) 5-325 MG tablet Take 1-2 tablets by mouth every 4 (four) hours as needed. 12/27/19   Arby Barrette, MD  polyethylene glycol (MIRALAX) 17 g packet Take 17 g by mouth daily. 05/14/19   Benjiman Core, MD  tamsulosin (FLOMAX) 0.4 MG CAPS capsule Take 1 capsule (0.4 mg total) by mouth daily. 03/07/18   Hongalgi, Maximino Greenland, MD    Allergies    Patient has no known allergies.  Review of Systems  Review of Systems  Constitutional:       Per HPI, otherwise negative  HENT:       Per HPI, otherwise negative  Respiratory:       Per HPI, otherwise negative  Cardiovascular:       Per HPI, otherwise negative  Gastrointestinal: Negative for vomiting.  Endocrine:       Negative aside from HPI  Genitourinary:       Neg aside from HPI   Musculoskeletal:       Per HPI, otherwise negative  Skin: Negative.   Neurological: Negative for syncope and weakness.    Physical Exam Updated Vital Signs BP 131/88 (BP Location: Left Arm)   Pulse (!) 105   Temp 98.1 F (36.7 C) (Oral)   Resp 18   SpO2 100%   Physical Exam Vitals and nursing note  reviewed.  Constitutional:      General: He is not in acute distress.    Appearance: He is well-developed.  HENT:     Head: Normocephalic and atraumatic.  Eyes:     Extraocular Movements: EOM normal.     Conjunctiva/sclera: Conjunctivae normal.     Comments: L eye blindness  Cardiovascular:     Rate and Rhythm: Normal rate and regular rhythm.  Pulmonary:     Effort: Pulmonary effort is normal. No respiratory distress.     Breath sounds: No stridor.  Abdominal:     General: There is no distension.  Musculoskeletal:        General: No edema.       Arms:  Skin:    General: Skin is warm and dry.     Findings: No erythema or rash.  Neurological:     Mental Status: He is alert and oriented to person, place, and time.     Cranial Nerves: Cranial nerves are intact.     Motor: Atrophy present. No tremor or abnormal muscle tone.  Psychiatric:        Mood and Affect: Mood and affect normal.     ED Results / Procedures / Treatments   Labs (all labs ordered are listed, but only abnormal results are displayed) Labs Reviewed  URINALYSIS, ROUTINE W REFLEX MICROSCOPIC - Abnormal; Notable for the following components:      Result Value   Color, Urine AMBER (*)    APPearance CLOUDY (*)    Hgb urine dipstick SMALL (*)    Ketones, ur 5 (*)    Protein, ur 30 (*)    Leukocytes,Ua MODERATE (*)    WBC, UA >50 (*)    Bacteria, UA MANY (*)    All other components within normal limits    EKG None  Radiology DG Chest 2 View  Result Date: 02/22/2020 CLINICAL DATA:  Cough, back pain EXAM: CHEST - 2 VIEW COMPARISON:  None. FINDINGS: Lungs are clear. No pneumothorax or pleural effusion. Cardiac size within normal limits. Pulmonary vascularity is normal. Advanced vascular calcifications are seen within the a left subclavian artery and axillary arteries bilaterally. No acute bone abnormality. IMPRESSION: No active cardiopulmonary disease. Electronically Signed   By: Helyn Numbers MD   On:  02/22/2020 23:50     Medications Ordered in ED Medications  cephALEXin (KEFLEX) capsule 500 mg (has no administration in time range)  ketorolac (TORADOL) 30 MG/ML injection 30 mg (30 mg Intramuscular Given 02/23/20 0945)    ED Course  I have reviewed the triage vital signs and the nursing notes.  Pertinent labs & imaging  results that were available during my care of the patient were reviewed by me and considered in my medical decision making (see chart for details).    MDM Rules/Calculators/A&P                          10:27 AM On repeat exam patient is awake and alert. We discussed findings, unremarkable x-ray, which I have reviewed, and urinalysis suggesting infection. With his history of prior catheterization, urinary tract issues, and abnormal urinalysis, with flank pain, some suspicion for infection, though the patient is afebrile, no distress, no evidence for bacteremia or sepsis. Patient will be started on Keflex, will follow up with his urology team as needed.  Final Clinical Impression(s) / ED Diagnoses Final diagnoses:  Pyelonephritis    Rx / DC Orders ED Discharge Orders         Ordered    ibuprofen (ADVIL) 200 MG tablet  3 times daily        02/23/20 1030    cephALEXin (KEFLEX) 500 MG capsule  2 times daily        02/23/20 1030           Gerhard Munch, MD 02/23/20 1031

## 2020-03-27 ENCOUNTER — Other Ambulatory Visit: Payer: Self-pay

## 2020-03-27 ENCOUNTER — Emergency Department (HOSPITAL_COMMUNITY)
Admission: EM | Admit: 2020-03-27 | Discharge: 2020-03-27 | Disposition: A | Discharge status: Home / Self Care | Payer: Medicaid Other | Attending: Emergency Medicine | Admitting: Emergency Medicine

## 2020-03-27 ENCOUNTER — Encounter (HOSPITAL_COMMUNITY): Payer: Self-pay | Admitting: Emergency Medicine

## 2020-03-27 DIAGNOSIS — F17210 Nicotine dependence, cigarettes, uncomplicated: Secondary | ICD-10-CM | POA: Diagnosis not present

## 2020-03-27 DIAGNOSIS — M545 Low back pain, unspecified: Secondary | ICD-10-CM | POA: Diagnosis present

## 2020-03-27 MED ORDER — KETOROLAC TROMETHAMINE 15 MG/ML IJ SOLN
15.0000 mg | Freq: Once | INTRAMUSCULAR | Status: AC
  Administered 2020-03-27: 15 mg via INTRAMUSCULAR
  Filled 2020-03-27: qty 1

## 2020-03-27 MED ORDER — DEXAMETHASONE SODIUM PHOSPHATE 10 MG/ML IJ SOLN
10.0000 mg | Freq: Once | INTRAMUSCULAR | Status: AC
  Administered 2020-03-27: 10 mg via INTRAMUSCULAR
  Filled 2020-03-27: qty 1

## 2020-03-27 NOTE — ED Provider Notes (Signed)
[name] DEPT Provider Note   CSN: [id number] Arrival date & time: 03/27/20  1746     History Chief Complaint  Patient presents with  . Leg Pain    Albert Salazar is a 65 y.o. male.  HPI Patient presents with concern of back pain.  He has history of multiple medical issues, and ongoing back pain.  He and I met last month when he had presentation for similar concerns. Notes that in the interim he was generally well, pain substantially improved.  He has seen his urologist, had increase in his Flomax. There is no new fever, dysuria.  He notes pain throughout his lower back, worse with motion or palpation.    Past Medical History:  Diagnosis Date  . Bipolar disorder ([place])   . Blindness of left eye   . Cirrhosis of liver ([place])   . Depression   . Hepatitis C   . Polysubstance abuse [facility])     Patient Active Problem List   Diagnosis Date Noted  . Aspiration pneumonia ([place]) 02/27/2018  . Bacteremia due to Gram-negative bacteria 02/27/2018  . Tachypnea   . Alcohol withdrawal ([place]) 02/20/2018  . Thrombocytopenia ([place]) 06/13/2014  . Alcohol intoxication ([place]) 06/13/2014  . Normocytic anemia 06/13/2014  . Peripheral edema 06/13/2014  . Left epididymitis 06/13/2014  . Dysuria 06/13/2014  . Alcohol abuse   . Alcoholic cirrhosis of liver with ascites ([place])   . Chronic hepatitis C without hepatic coma ([place]) 06/07/2014  . Hepatic cirrhosis ([place]) 06/07/2014  . Elevated BP 04/21/2014  . Alcohol use disorder, severe, dependence ([place]) 12/30/2013  . Cocaine use disorder, mild, abuse ([place]) 12/30/2013  . Substance induced mood disorder ([place]) 12/30/2013    Past Surgical History:  Procedure Laterality Date  . EYE SURGERY         Family History  Problem Relation Age of Onset  . Diabetes Brother     Social History   Tobacco Use  . Smoking status: Current Every Day Smoker    Packs/day: 1.00    Years: 40.00    Pack years: 40.00    Types:  Cigarettes  . Smokeless tobacco: Never Used  Vaping Use  . Vaping Use: Never used  Substance Use Topics  . Alcohol use: Yes    Alcohol/week: 8.0 standard drinks    Types: 8 Cans of beer per week  . Drug use: Yes    Types: IV, Cocaine    Home Medications Prior to Admission medications   Medication Sig Start Date End Date Taking? Authorizing Provider  oxyCODONE-acetaminophen (PERCOCET) 5-325 MG tablet Take 1-2 tablets by mouth every 4 (four) hours as needed. 12/27/19   Charlesetta Shanks, MD  polyethylene glycol (MIRALAX) 17 g packet Take 17 g by mouth daily. 05/14/19   Davonna Belling, MD  tamsulosin (FLOMAX) 0.4 MG CAPS capsule Take 1 capsule (0.4 mg total) by mouth daily. 03/07/18   Hongalgi, Lenis Dickinson, MD    Allergies    Patient has no known allergies.  Review of Systems   Review of Systems  Constitutional:       Per HPI, otherwise negative  HENT:       Per HPI, otherwise negative  Respiratory:       Per HPI, otherwise negative  Cardiovascular:       Per HPI, otherwise negative  Gastrointestinal: Negative for vomiting.  Endocrine:       Negative aside from HPI  Genitourinary:       Neg aside from  HPI   Musculoskeletal:       Per HPI, otherwise negative  Skin: Negative.   Neurological: Negative for syncope.    Physical Exam Updated Vital Signs BP 124/69 (BP Location: Right Arm)   Pulse 97   Temp 98 F (36.7 C) (Oral)   Resp 19   SpO2 99%   Physical Exam Vitals and nursing note reviewed.  Constitutional:      General: He is not in acute distress.    Appearance: He is well-developed. He is not diaphoretic.  HENT:     Head: Normocephalic and atraumatic.  Eyes:     Extraocular Movements: EOM normal.     Conjunctiva/sclera: Conjunctivae normal.  Cardiovascular:     Rate and Rhythm: Normal rate and regular rhythm.  Pulmonary:     Effort: Pulmonary effort is normal. No respiratory distress.     Breath sounds: No stridor.  Abdominal:     General: There is no  distension.  Musculoskeletal:        General: No edema.     Comments: No appreciable deformities, the patient does move all extremity spontaneously.   Skin:    General: Skin is warm and dry.  Neurological:     Mental Status: He is alert and oriented to person, place, and time.  Psychiatric:        Mood and Affect: Mood and affect normal.     ED Results / Procedures / Treatments   Labs (all labs ordered are listed, but only abnormal results are displayed) Labs Reviewed - No data to display  EKG None  Radiology No results found.  Procedures Procedures   Medications Ordered in ED Medications  ketorolac (TORADOL) 15 MG/ML injection 15 mg (15 mg Intramuscular Given 03/27/20 1912)  dexamethasone (DECADRON) injection 10 mg (10 mg Intramuscular Given 03/27/20 1913)    ED Course  I have reviewed the triage vital signs and the nursing notes.  Pertinent labs & imaging results that were available during my care of the patient were reviewed by me and considered in my medical decision making (see chart for details).  9:14 PM Patient sleeping, awakens easily, is in no distress, notes that he feels substantially better.  He gets himself from bed without any apparent difficulty, ambulates without any abnormalities.  On he and I discussed his improvement here following multiple anti-inflammatories, and without red flags for CNS pathology, without urinary complaints, without fever, low suspicion for other acute findings, patient discharged in stable condition. Final Clinical Impression(s) / ED Diagnoses Final diagnoses:  Acute bilateral low back pain without sciatica    Rx / DC Orders ED Discharge Orders    None       Carmin Muskrat, MD 03/27/20 2115

## 2020-03-27 NOTE — Discharge Instructions (Addendum)
As discussed, your evaluation today has been largely reassuring.  But, it is important that you monitor your condition carefully, and do not hesitate to return to the ED if you develop new, or concerning changes in your condition. ? ?Otherwise, please follow-up with your physician for appropriate ongoing care. ? ?

## 2020-03-27 NOTE — ED Triage Notes (Signed)
Patient here from home reporting bilateral leg pain below the knee. States that he is cold. Better when sitting down.

## 2020-04-28 ENCOUNTER — Other Ambulatory Visit: Payer: Self-pay

## 2020-04-28 ENCOUNTER — Emergency Department (HOSPITAL_COMMUNITY)
Admission: EM | Admit: 2020-04-28 | Discharge: 2020-04-28 | Disposition: A | Discharge status: Home / Self Care | Payer: Medicaid Other | Attending: Emergency Medicine | Admitting: Emergency Medicine

## 2020-04-28 ENCOUNTER — Encounter (HOSPITAL_COMMUNITY): Payer: Self-pay

## 2020-04-28 ENCOUNTER — Emergency Department (HOSPITAL_COMMUNITY): Payer: Medicaid Other

## 2020-04-28 DIAGNOSIS — M25511 Pain in right shoulder: Secondary | ICD-10-CM | POA: Diagnosis present

## 2020-04-28 DIAGNOSIS — F17210 Nicotine dependence, cigarettes, uncomplicated: Secondary | ICD-10-CM | POA: Insufficient documentation

## 2020-04-28 MED ORDER — KETOROLAC TROMETHAMINE 30 MG/ML IJ SOLN
30.0000 mg | Freq: Once | INTRAMUSCULAR | Status: AC
  Administered 2020-04-28: 30 mg via INTRAMUSCULAR
  Filled 2020-04-28: qty 1

## 2020-04-28 MED ORDER — MELOXICAM 7.5 MG PO TABS: 7.5000 mg | ORAL_TABLET | Freq: Every day | ORAL | 0 refills | Status: DC

## 2020-04-28 MED ORDER — METHOCARBAMOL 500 MG PO TABS: 500.0000 mg | ORAL_TABLET | Freq: Two times a day (BID) | ORAL | 0 refills | Status: DC

## 2020-04-28 NOTE — ED Provider Notes (Signed)
[facility]-EMERGENCY DEPT Provider Note   CSN: [id number] Arrival date & time: 04/28/20  1433     History Chief Complaint  Patient presents with  . Back Pain  . Shoulder Pain    Albert Salazar is a 65 y.o. male.  HPI Patient is a 65 year old male presented today with right shoulder pain since this morning.  He denies any injury or trauma.  He states that he occasionally gets this kind of pain.  He states it is aching and constant.  Denies any associated symptoms.  He denies any chest pain or shortness of breath.  He denies any worsening factors apart from worse with overhead movement.  No other associated symptoms.  He has tried no medications prior to arrival.     Past Medical History:  Diagnosis Date  . Bipolar disorder (HCC)   . Blindness of left eye   . Cirrhosis of liver (HCC)   . Depression   . Hepatitis C   . Polysubstance abuse [facility])     Patient Active Problem List   Diagnosis Date Noted  . Aspiration pneumonia (HCC) 02/27/2018  . Bacteremia due to Gram-negative bacteria 02/27/2018  . Tachypnea   . Alcohol withdrawal (HCC) 02/20/2018  . Thrombocytopenia (HCC) 06/13/2014  . Alcohol intoxication (HCC) 06/13/2014  . Normocytic anemia 06/13/2014  . Peripheral edema 06/13/2014  . Left epididymitis 06/13/2014  . Dysuria 06/13/2014  . Alcohol abuse   . Alcoholic cirrhosis of liver with ascites (HCC)   . Chronic hepatitis C without hepatic coma (HCC) 06/07/2014  . Hepatic cirrhosis (HCC) 06/07/2014  . Elevated BP 04/21/2014  . Alcohol use disorder, severe, dependence (HCC) 12/30/2013  . Cocaine use disorder, mild, abuse (HCC) 12/30/2013  . Substance induced mood disorder (HCC) 12/30/2013    Past Surgical History:  Procedure Laterality Date  . EYE SURGERY         Family History  Problem Relation Age of Onset  . Diabetes Brother     Social History   Tobacco Use  . Smoking status: Current Every Day Smoker    Packs/day: 0.50     Years: 40.00    Pack years: 20.00    Types: Cigarettes  . Smokeless tobacco: Never Used  Vaping Use  . Vaping Use: Never used  Substance Use Topics  . Alcohol use: Yes    Alcohol/week: 8.0 standard drinks    Types: 8 Cans of beer per week  . Drug use: Not Currently    Types: Cocaine    Home Medications Prior to Admission medications   Medication Sig Start Date End Date Taking? Authorizing Provider  meloxicam (MOBIC) 7.5 MG tablet Take 1 tablet (7.5 mg total) by mouth daily. 04/28/20  Yes Maxim Bedel S, PA  methocarbamol (ROBAXIN) 500 MG tablet Take 1 tablet (500 mg total) by mouth 2 (two) times daily. 04/28/20  Yes Mahagony Grieb, Stevphen Meuse S, PA  oxyCODONE-acetaminophen (PERCOCET) 5-325 MG tablet Take 1-2 tablets by mouth every 4 (four) hours as needed. 12/27/19   Arby Barrette, MD  polyethylene glycol (MIRALAX) 17 g packet Take 17 g by mouth daily. 05/14/19   Benjiman Core, MD  tamsulosin (FLOMAX) 0.4 MG CAPS capsule Take 1 capsule (0.4 mg total) by mouth daily. 03/07/18   Hongalgi, Maximino [place], MD    Allergies    Patient has no known allergies.  Review of Systems   Review of Systems  Constitutional: Negative for chills and fever.  HENT: Negative for congestion.   Respiratory: Negative for shortness  of breath.   Cardiovascular: Negative for chest pain.  Gastrointestinal: Negative for abdominal pain.  Musculoskeletal: Negative for neck pain.       Right shoulder pain    Physical Exam Updated Vital Signs BP 103/80 (BP Location: Right Arm)   Pulse (!) 108   Temp 98.4 F (36.9 C) (Oral)   Resp 16   Ht 5\' 9"  (1.753 m)   Wt 72.6 kg   SpO2 99%   BMI 23.63 kg/m   Physical Exam Vitals and nursing note reviewed.  Constitutional:      General: He is not in acute distress. HENT:     Head: Normocephalic and atraumatic.     Nose: Nose normal.     Mouth/Throat:     Mouth: Mucous membranes are moist.  Eyes:     General: No scleral icterus. Cardiovascular:     Rate and Rhythm:  Normal rate and regular rhythm.     Pulses: Normal pulses.     Heart sounds: Normal heart sounds.  Pulmonary:     Effort: Pulmonary effort is normal. No respiratory distress.     Breath sounds: No wheezing.  Abdominal:     Palpations: Abdomen is soft.     Tenderness: There is no abdominal tenderness. There is no guarding or rebound.  Musculoskeletal:     Cervical back: Normal range of motion.     Right lower leg: No edema.     Left lower leg: No edema.     Comments: Significant tenderness to palpation of the right parascapular musculature.  Patient flinches with palpation of the trigger point.  No significant bony tenderness.  Skin:    General: Skin is warm and dry.     Capillary Refill: Capillary refill takes less than 2 seconds.  Neurological:     Mental Status: He is alert. Mental status is at baseline.  Psychiatric:        Mood and Affect: Mood normal.        Behavior: Behavior normal.     ED Results / Procedures / Treatments   Labs (all labs ordered are listed, but only abnormal results are displayed) Labs Reviewed - No data to display  EKG None  Radiology DG Shoulder Right  Result Date: 04/28/2020 CLINICAL DATA:  Right shoulder pain EXAM: RIGHT SHOULDER - 2+ VIEW COMPARISON:  None. FINDINGS: No fracture or malalignment. Mild AC joint degenerative change. Axillary vascular calcification. Small metallic screw projects over right eighth rib on one view. IMPRESSION: No acute osseous abnormality. Mild AC joint degenerative change. Small metallic screw projects over right eighth rib on one view, question external artifact Electronically Signed   By: 04/30/2020 M.D.   On: 04/28/2020 15:29    Procedures Procedures   Medications Ordered in ED Medications  ketorolac (TORADOL) 30 MG/ML injection 30 mg (30 mg Intramuscular Given 04/28/20 1557)    ED Course  I have reviewed the triage vital signs and the nursing notes.  Pertinent labs & imaging results that were available  during my care of the patient were reviewed by me and considered in my medical decision making (see chart for details).    MDM Rules/Calculators/A&P                          Patient is a 65 year old male presented today for right shoulder pain.  Physical exam is notable for muscular tenderness.  Mild tachycardia noted (noted tachycardia on visits 1, 2 and 4  months ago) Ketorolac 30mg  injection given.  Pt last used cocaine 3 days ago.   IMPRESSION:  No acute osseous abnormality. Mild AC joint degenerative change.  Small metallic screw projects over right eighth rib on one view,  question external artifact   Patient has no puncture marks on his skin.  I suspect this metallic screw was external to the patient in his shirt pocket.  I discussed this finding with the patient and he states that he is not worried that there is a screw punctured into his body. I suspect this was an external artifact and agree of radiology read.  DC w mobic and robaxin FU woith PCP  Final Clinical Impression(s) / ED Diagnoses Final diagnoses:  Acute pain of right shoulder    Rx / DC Orders ED Discharge Orders         Ordered    methocarbamol (ROBAXIN) 500 MG tablet  2 times daily        04/28/20 1556    meloxicam (MOBIC) 7.5 MG tablet  Daily        04/28/20 1556           04/30/20 [place], DOLE 04/28/20 2355    2356, MD 04/29/20 1505

## 2020-04-28 NOTE — Discharge Instructions (Signed)
Please do warm compresses at a time to your shoulder (heating pad or similar) four times daily. Stretching is beneficial. As is staying active and moving the shoulder through the entire range of motion.   Take mobic as prescribed (once daily) and tylenol 1000mg  every 6 hours. I have also given you a muscle relaxer called robaxin.   Follow up with your primary care doctor . Please do not use any cocaine as this can cause numerous issues including a fast heart rate that can cause long-term damage to your heart.

## 2020-04-28 NOTE — ED Triage Notes (Signed)
Patient c/o right upper back and right shoulder pain this AM. Patient denies any injury or heavy lifting.  Patient told registration he thinks he had a stroke. When asked about this he stated, "This morning I felt like a bee had stung my shoulder and I said ain't no damn bee in the house." patient does not have slurred speech or any other stroke symptoms. Patient frequently talks about his shoulder pain.

## 2020-11-01 DIAGNOSIS — R3915 Urgency of urination: Secondary | ICD-10-CM | POA: Diagnosis not present

## 2020-11-01 DIAGNOSIS — R5383 Other fatigue: Secondary | ICD-10-CM | POA: Diagnosis not present

## 2021-02-04 ENCOUNTER — Emergency Department (HOSPITAL_COMMUNITY)
Admission: EM | Admit: 2021-02-04 | Discharge: 2021-02-04 | Disposition: A | Discharge status: Home / Self Care | Payer: Medicaid Other | Attending: Emergency Medicine | Admitting: Emergency Medicine

## 2021-02-04 ENCOUNTER — Emergency Department (HOSPITAL_COMMUNITY): Payer: Medicaid Other

## 2021-02-04 ENCOUNTER — Encounter (HOSPITAL_COMMUNITY): Payer: Self-pay

## 2021-02-04 ENCOUNTER — Other Ambulatory Visit: Payer: Self-pay

## 2021-02-04 DIAGNOSIS — Z20822 Contact with and (suspected) exposure to covid-19: Secondary | ICD-10-CM | POA: Diagnosis not present

## 2021-02-04 DIAGNOSIS — R0602 Shortness of breath: Secondary | ICD-10-CM | POA: Diagnosis not present

## 2021-02-04 DIAGNOSIS — N400 Enlarged prostate without lower urinary tract symptoms: Secondary | ICD-10-CM | POA: Diagnosis not present

## 2021-02-04 DIAGNOSIS — K4090 Unilateral inguinal hernia, without obstruction or gangrene, not specified as recurrent: Secondary | ICD-10-CM | POA: Diagnosis not present

## 2021-02-04 DIAGNOSIS — K4091 Unilateral inguinal hernia, without obstruction or gangrene, recurrent: Secondary | ICD-10-CM | POA: Diagnosis not present

## 2021-02-04 DIAGNOSIS — N3289 Other specified disorders of bladder: Secondary | ICD-10-CM | POA: Diagnosis not present

## 2021-02-04 LAB — COMPREHENSIVE METABOLIC PANEL
ALT: 34 U/L (ref 0–44)
AST: 35 U/L (ref 15–41)
Albumin: 3.1 g/dL — ABNORMAL LOW (ref 3.5–5.0)
Alkaline Phosphatase: 104 U/L (ref 38–126)
Anion gap: 8 (ref 5–15)
BUN: 12 mg/dL (ref 8–23)
CO2: 26 mmol/L (ref 22–32)
Calcium: 8.6 mg/dL — ABNORMAL LOW (ref 8.9–10.3)
Chloride: 105 mmol/L (ref 98–111)
Creatinine, Ser: 0.83 mg/dL (ref 0.61–1.24)
GFR, Estimated: >60 mL/min (ref >60)
Glucose, Bld: 113 mg/dL — ABNORMAL HIGH (ref 70–99)
Potassium: 4 mmol/L (ref 3.5–5.1)
Sodium: 139 mmol/L (ref 135–145)
Total Bilirubin: 1.1 mg/dL (ref 0.3–1.2)
Total Protein: 7.5 g/dL (ref 6.5–8.1)

## 2021-02-04 LAB — TROPONIN I (HIGH SENSITIVITY)
Troponin I (High Sensitivity): 9 ng/L (ref <18)
Troponin I (High Sensitivity): 9 ng/L (ref <18)

## 2021-02-04 LAB — CBC WITH DIFFERENTIAL/PLATELET
Abs Immature Granulocytes: 0.02 10*3/uL (ref 0.00–0.07)
Basophils Absolute: 0 10*3/uL (ref 0.0–0.1)
Basophils Relative: 0 %
Eosinophils Absolute: 0 10*3/uL (ref 0.0–0.5)
Eosinophils Relative: 0 %
HCT: 41.9 % (ref 39.0–52.0)
Hemoglobin: 13.6 g/dL (ref 13.0–17.0)
Immature Granulocytes: 0 %
Lymphocytes Relative: 40 %
Lymphs Abs: 2.4 10*3/uL (ref 0.7–4.0)
MCH: 30.9 pg (ref 26.0–34.0)
MCHC: 32.5 g/dL (ref 30.0–36.0)
MCV: 95.2 fL (ref 80.0–100.0)
Monocytes Absolute: 0.5 10*3/uL (ref 0.1–1.0)
Monocytes Relative: 8 %
Neutro Abs: 3 10*3/uL (ref 1.7–7.7)
Neutrophils Relative %: 52 %
Platelets: 100 10*3/uL — ABNORMAL LOW (ref 150–400)
RBC: 4.4 MIL/uL (ref 4.22–5.81)
RDW: 14.6 % (ref 11.5–15.5)
WBC: 5.9 10*3/uL (ref 4.0–10.5)
nRBC: 0 % (ref 0.0–0.2)

## 2021-02-04 LAB — LACTIC ACID, PLASMA: Lactic Acid, Venous: 1.6 mmol/L (ref 0.5–1.9)

## 2021-02-04 LAB — RESP PANEL BY RT-PCR (FLU A&B, COVID) ARPGX2
Influenza A by PCR: NEGATIVE
Influenza B by PCR: NEGATIVE
SARS Coronavirus 2 by RT PCR: NEGATIVE

## 2021-02-04 LAB — BRAIN NATRIURETIC PEPTIDE: B Natriuretic Peptide: 63 pg/mL (ref 0.0–100.0)

## 2021-02-04 LAB — LIPASE, BLOOD: Lipase: 27 U/L (ref 11–51)

## 2021-02-04 LAB — MAGNESIUM: Magnesium: 1.5 mg/dL — ABNORMAL LOW (ref 1.7–2.4)

## 2021-02-04 MED ORDER — MAGNESIUM SULFATE 2 GM/50ML IV SOLN
2.0000 g | Freq: Once | INTRAVENOUS | Status: AC
  Administered 2021-02-04: 2 g via INTRAVENOUS
  Filled 2021-02-04: qty 50

## 2021-02-04 MED ORDER — IOHEXOL 350 MG/ML SOLN
80.0000 mL | Freq: Once | INTRAVENOUS | Status: AC | PRN
  Administered 2021-02-04: 80 mL via INTRAVENOUS

## 2021-02-04 MED ORDER — ONDANSETRON HCL 4 MG/2ML IJ SOLN
4.0000 mg | Freq: Four times a day (QID) | INTRAMUSCULAR | Status: DC | PRN
  Administered 2021-02-04: 4 mg via INTRAVENOUS
  Filled 2021-02-04: qty 2

## 2021-02-04 MED ORDER — HYDROMORPHONE HCL 1 MG/ML IJ SOLN
0.5000 mg | Freq: Once | INTRAMUSCULAR | Status: AC
  Administered 2021-02-04: 0.5 mg via INTRAVENOUS
  Filled 2021-02-04: qty 1

## 2021-02-04 NOTE — Discharge Instructions (Signed)
We will need to follow-up with general surgery for your hernia repair.  You will also need follow-up with urology for further evaluation of your prostate gland.  There are numbers below to call to set up this follow-up appointment.  If you develop any worsening symptoms or if your hernia pokes out and is unable to be pushed back into place, please return to the emergency department.

## 2021-02-04 NOTE — ED Provider Notes (Signed)
[place] [facility] DEPT Provider Note   CSN: [id number] Arrival date & time: 02/04/21  0055     History  Chief Complaint  Patient presents with   Shortness of Breath    Albert Salazar is a 66 y.o. male.   Shortness of Breath Associated symptoms: no abdominal pain, no chest pain, no cough, no ear pain, no fever, no headaches, no neck pain, no rash, no sore throat, no vomiting and no wheezing   Patient presents for worsening pain in the area of his right-sided inguinal hernia.  He states that he has had issues with his hernia for several years.  He states that he has been seen as an outpatient by both urology and general surgery.  He was told that they cannot do anything due to "fluid around the area".  Over the past several weeks, he has had worsening severity of pain.  Pain is worsened with standing up.  He has had nausea and vomiting as well over this time.  He does continue to have bowel movements.  He is actually had an issue with diarrhea.  Last episode of diarrhea was today.  Additionally, last night he experienced some shortness of breath when standing.  He states that this was, in part, due to the pain from his hernia.  He does state that he continues to feel mildly short of breath which is not normal for him.   Medical history notable for polysubstance abuse, cirrhosis.  Current alcohol/drug use is seldom.    Home Medications Prior to Admission medications   Medication Sig Start Date End Date Taking? Authorizing Provider  tamsulosin (FLOMAX) 0.4 MG CAPS capsule Take 1 capsule (0.4 mg total) by mouth daily. Patient taking differently: Take 0.4 mg by mouth in the morning and at bedtime. 03/07/18  Yes Hongalgi, Lenis Dickinson, MD  meloxicam (MOBIC) 7.5 MG tablet Take 1 tablet (7.5 mg total) by mouth daily. Patient not taking: Reported on 02/04/2021 04/28/20   Tedd Sias, PA  methocarbamol (ROBAXIN) 500 MG tablet Take 1 tablet (500 mg total) by mouth 2 (two) times  daily. Patient not taking: Reported on 02/04/2021 04/28/20   Tedd Sias, PA  oxyCODONE-acetaminophen (PERCOCET) 5-325 MG tablet Take 1-2 tablets by mouth every 4 (four) hours as needed. Patient not taking: Reported on 02/04/2021 12/27/19   Charlesetta Shanks, MD  polyethylene glycol (MIRALAX) 17 g packet Take 17 g by mouth daily. Patient not taking: Reported on 02/04/2021 05/14/19   Davonna Belling, MD      Allergies    Patient has no known allergies.    Review of Systems   Review of Systems  Constitutional:  Negative for activity change, chills, fatigue and fever.  HENT:  Negative for congestion, ear pain and sore throat.   Eyes:  Negative for pain and visual disturbance.  Respiratory:  Positive for shortness of breath. Negative for cough, chest tightness and wheezing.   Cardiovascular:  Negative for chest pain, palpitations and leg swelling.  Gastrointestinal:  Positive for diarrhea and nausea. Negative for abdominal distention, abdominal pain, blood in stool, rectal pain and vomiting.  Genitourinary:  Positive for scrotal swelling. Negative for decreased urine volume, dysuria, flank pain, frequency and hematuria.  Musculoskeletal:  Negative for arthralgias, back pain, myalgias and neck pain.  Skin:  Negative for color change and rash.  Neurological:  Negative for dizziness, seizures, syncope, weakness, light-headedness, numbness and headaches.  Hematological:  Does not bruise/bleed easily.  Psychiatric/Behavioral:  Negative for confusion and  decreased concentration.   All other systems reviewed and are negative.  Physical Exam Updated Vital Signs BP (!) 156/102    Pulse 78    Temp 97.8 F (36.6 C) (Oral)    Resp 18    Ht 5\' 9"  (1.753 m)    Wt 72.6 kg    SpO2 98%    BMI 23.63 kg/m  Physical Exam Vitals and nursing note reviewed. Exam conducted with a chaperone present.  Constitutional:      General: He is not in acute distress.    Appearance: He is well-developed and normal weight.  He is not ill-appearing, toxic-appearing or diaphoretic.  HENT:     Head: Normocephalic and atraumatic.     Mouth/Throat:     Mouth: Mucous membranes are moist.     Pharynx: Oropharynx is clear.  Eyes:     Conjunctiva/sclera: Conjunctivae normal.  Neck:     Vascular: No JVD.  Cardiovascular:     Rate and Rhythm: Normal rate and regular rhythm.     Heart sounds: No murmur heard. Pulmonary:     Effort: Pulmonary effort is normal. No tachypnea, accessory muscle usage or respiratory distress.     Breath sounds: Normal breath sounds. No decreased breath sounds, wheezing, rhonchi or rales.  Chest:     Chest wall: No tenderness.  Abdominal:     Palpations: Abdomen is soft.     Tenderness: There is no abdominal tenderness.     Hernia: A hernia is present. Hernia is present in the right inguinal area. There is no hernia in the left inguinal area.  Genitourinary:    Testes:        Right: Mass and swelling present.        Left: Mass or swelling not present.  Musculoskeletal:        General: No swelling.     Cervical back: Normal range of motion and neck supple.     Right lower leg: No edema.     Left lower leg: No edema.  Skin:    General: Skin is warm and dry.     Capillary Refill: Capillary refill takes less than 2 seconds.  Neurological:     General: No focal deficit present.     Mental Status: He is alert and oriented to person, place, and time.     Cranial Nerves: No cranial nerve deficit.     Motor: No weakness.  Psychiatric:        Mood and Affect: Mood normal.        Behavior: Behavior normal.    ED Results / Procedures / Treatments   Labs (all labs ordered are listed, but only abnormal results are displayed) Labs Reviewed  COMPREHENSIVE METABOLIC PANEL - Abnormal; Notable for the following components:      Result Value   Glucose, Bld 113 (*)    Calcium 8.6 (*)    Albumin 3.1 (*)    All other components within normal limits  CBC WITH DIFFERENTIAL/PLATELET -  Abnormal; Notable for the following components:   Platelets 100 (*)    All other components within normal limits  MAGNESIUM - Abnormal; Notable for the following components:   Magnesium 1.5 (*)    All other components within normal limits  RESP PANEL BY RT-PCR (FLU A&B, COVID) ARPGX2  LIPASE, BLOOD  LACTIC ACID, PLASMA  BRAIN NATRIURETIC PEPTIDE  TROPONIN I (HIGH SENSITIVITY)  TROPONIN I (HIGH SENSITIVITY)    EKG EKG Interpretation  Date/Time:  Saturday February 04 2021 15:24:47 EST Ventricular Rate:  77 PR Interval:  168 QRS Duration: 89 QT Interval:  401 QTC Calculation: 454 R Axis:   93 Text Interpretation: Sinus rhythm Right axis deviation Abnrm T, consider ischemia, anterolateral lds Confirmed by Gloris Manchester [phone]) on 02/04/2021 3:49:54 PM  Radiology CT PELVIS W CONTRAST  Result Date: 02/04/2021 CLINICAL DATA:  Hernia right-sided pain EXAM: CT PELVIS WITH CONTRAST TECHNIQUE: Multidetector CT imaging of the pelvis was performed using the standard protocol following the bolus administration of intravenous contrast. CONTRAST:  68mL OMNIPAQUE IOHEXOL 350 MG/ML SOLN COMPARISON:  CT 05/27/2018 FINDINGS: Urinary Tract:  Slightly thick-walled urinary bladder. Bowel: Negative for bowel distension or bowel wall thickening. Negative appendix. Vascular/Lymphatic: Advanced aortic atherosclerosis without aneurysm. No suspicious lymph nodes Reproductive: Markedly enlarged prostate with masslike protrusion towards the bladder. Other: Negative for pelvic effusion. Moderate right inguinal hernia containing fat and small bowel. No obstructive changes. Small amount of fluid in the right hernia sac but no soft tissue stranding. Musculoskeletal: No acute osseous abnormality IMPRESSION: 1. Moderate right inguinal hernia containing mesenteric fat and small bowel but no obstructive changes. Small amount of fluid in the right hernia sac but no other suspicious features for incarceration. 2. Markedly enlarged  prostate with masslike projection towards the bladder. Electronically Signed   By: Jasmine Pang M.D.   On: 02/04/2021 16:45   DG Chest Portable 1 View  Result Date: 02/04/2021 CLINICAL DATA:  Shortness of breath EXAM: PORTABLE CHEST 1 VIEW COMPARISON:  02/22/2020 FINDINGS: The heart size and mediastinal contours are within normal limits. Both lungs are clear. The visualized skeletal structures are unremarkable. IMPRESSION: No acute abnormality of the lungs in AP portable projection. Electronically Signed   By: Jearld Lesch M.D.   On: 02/04/2021 15:50    Procedures Procedures    Medications Ordered in ED Medications  HYDROmorphone (DILAUDID) injection 0.5 mg (0.5 mg Intravenous Given 02/04/21 1533)  iohexol (OMNIPAQUE) 350 MG/ML injection 80 mL (80 mLs Intravenous Contrast Given 02/04/21 1615)  magnesium sulfate IVPB 2 g 50 mL (0 g Intravenous Stopped 02/04/21 1913)    ED Course/ Medical Decision Making/ A&P                           Medical Decision Making  This patient presents to the ED for concern of inguinal hernia, this involves an extensive number of treatment options, and is a complaint that carries with it a high risk of complications and morbidity.  The differential diagnosis includes incarcerated hernia, strangulated hernia, bowel obstruction, hydrocele, neoplasm.   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  At that time, there was concern of ascites which was determined to possibly complicate surgery.  He was advised to have reduction in amount of ascitic fluid prior to surgical repair.   Lab Tests:  I Ordered, and personally interpreted labs.  The pertinent results include: Hypomagnesemia  (replaced in the ED), normal lactic acid, normal BNP and troponin.  Baseline normal hemoglobin with absence of leukocytosis, baseline thrombocytopenia.   Imaging Studies ordered:  I ordered imaging studies including CT scan of abdomen and pelvis I independently visualized and interpreted imaging which showed chest x-ray which showed no evidence  of cardiomegaly, pulmonary edema, focal opacity, or pneumothorax.  CT scan of abdomen pelvis showed right inguinal hernia with mesenteric fat and small bowel contents but no obstructive changes.  There was an incidental finding of enlarged prostate with masslike projection toward bladder. I agree with the radiologist interpretation   Cardiac Monitoring:  The patient was maintained on a cardiac monitor.  I personally viewed and interpreted the cardiac monitored which showed an underlying rhythm of: Sinus rhythm   Medicines ordered and prescription drug management:  I ordered medication including Dilaudid for analgesia to assist with reduction of needle hernia. Reevaluation of the patient after these medicines showed that the patient improved I have reviewed the patients home medicines and have made adjustments as needed   Critical Interventions:  Reduction of inguinal hernia   Problem List / ED Course:  Inguinal hernia Concern for prostatic mass Patient is a 66 year old male who presents for concerns of inguinal hernia.  He has significant discomfort from this, typically when he stands up.  He has been able to reduce it while laying supine.  On assessment, patient does have firm swelling in the scrotal area, consistent with bowel protruding through his hernia.  He was given Dilaudid for analgesia.  CT scan of abdomen and pelvis was obtained.  Results showed inguinal hernia without clear evidence of incarceration.  Laboratory work-up, including lactic acid was reassuring.  He was able to self reduce this hernia while laying supine.  Additionally,  he initially reported some shortness of breath.  He did undergo diagnostic work-up which showed normal troponins, and no acute findings on chest x-ray.  During his stay in the ED, his breathing was even and unlabored.  Lungs were clear to auscultation.  SPO2 is normal on room air.  His magnesium was low and this was replaced in the ED.  He would benefit from outpatient surgical management of his hernia, given the persistent discomfort it causes him.  I reviewed the EMR and when patient was seen by surgery in the past, they had concern of ascites.  Currently, patient has minimal ascites on exam.  He was advised to reengage with the surgical team to discuss repair of his hernia.  Additionally, he was informed of the CT scan findings of nodular swelling of his prostate.  He would benefit from urology follow-up for further testing due to concern of prostate neoplasm.  Contact information was provided for these outpatient services.  Additionally, ambulatory referrals were ordered.  Patient was instructed to return to the ED if his hernia subsequently protrudes and is unable to be reduced.  He expressed understanding.  Patient was discharged in stable condition.   Reevaluation:  After the interventions noted above, I reevaluated the patient and found that they have :improved   Social Determinants of Health:  Patient initially seemed to have poor medical understanding of his condition.  He was told by the outpatient surgery team that his ascites would need to improve prior to surgical management of his hernia.  When talking with him, he appeared to have poor understanding of what that meant.  He does appear to have better understanding following our discussions in the ED.  He currently has Medicaid but is working on Dance movement psychotherapist.   Dispostion:  After consideration of the diagnostic results and the patients response to treatment, I feel that the patent would benefit from discharge from the  ED with close follow-up by general surgery as well as urology.  Final Clinical Impression(s) / ED Diagnoses Final diagnoses:  Unilateral recurrent inguinal hernia without obstruction or gangrene    Rx / DC Orders ED Discharge Orders          Ordered    Ambulatory referral to General Surgery        02/04/21 2110    Ambulatory referral to Urology        02/04/21 2110              Godfrey Pick, MD 02/06/21 0246

## 2021-02-04 NOTE — ED Triage Notes (Signed)
Pt arrives EMS from home with c/o shortness of breath beginning around 630PM tonight. Pt says he has "a hernia from hell causing it". Sts he lost control of bowels and bladder on EMS stretcher. Cursing at people from another facility.

## 2021-03-02 DIAGNOSIS — R32 Unspecified urinary incontinence: Secondary | ICD-10-CM | POA: Diagnosis not present

## 2021-03-08 DIAGNOSIS — R32 Unspecified urinary incontinence: Secondary | ICD-10-CM | POA: Diagnosis not present

## 2021-04-02 IMAGING — CR DG CHEST 2V
2 series · 2 of 2 positions shown · non-contrast
Comparison: None.

CLINICAL DATA: Cough, back pain

EXAM:
CHEST - 2 VIEW

[w chest pa]
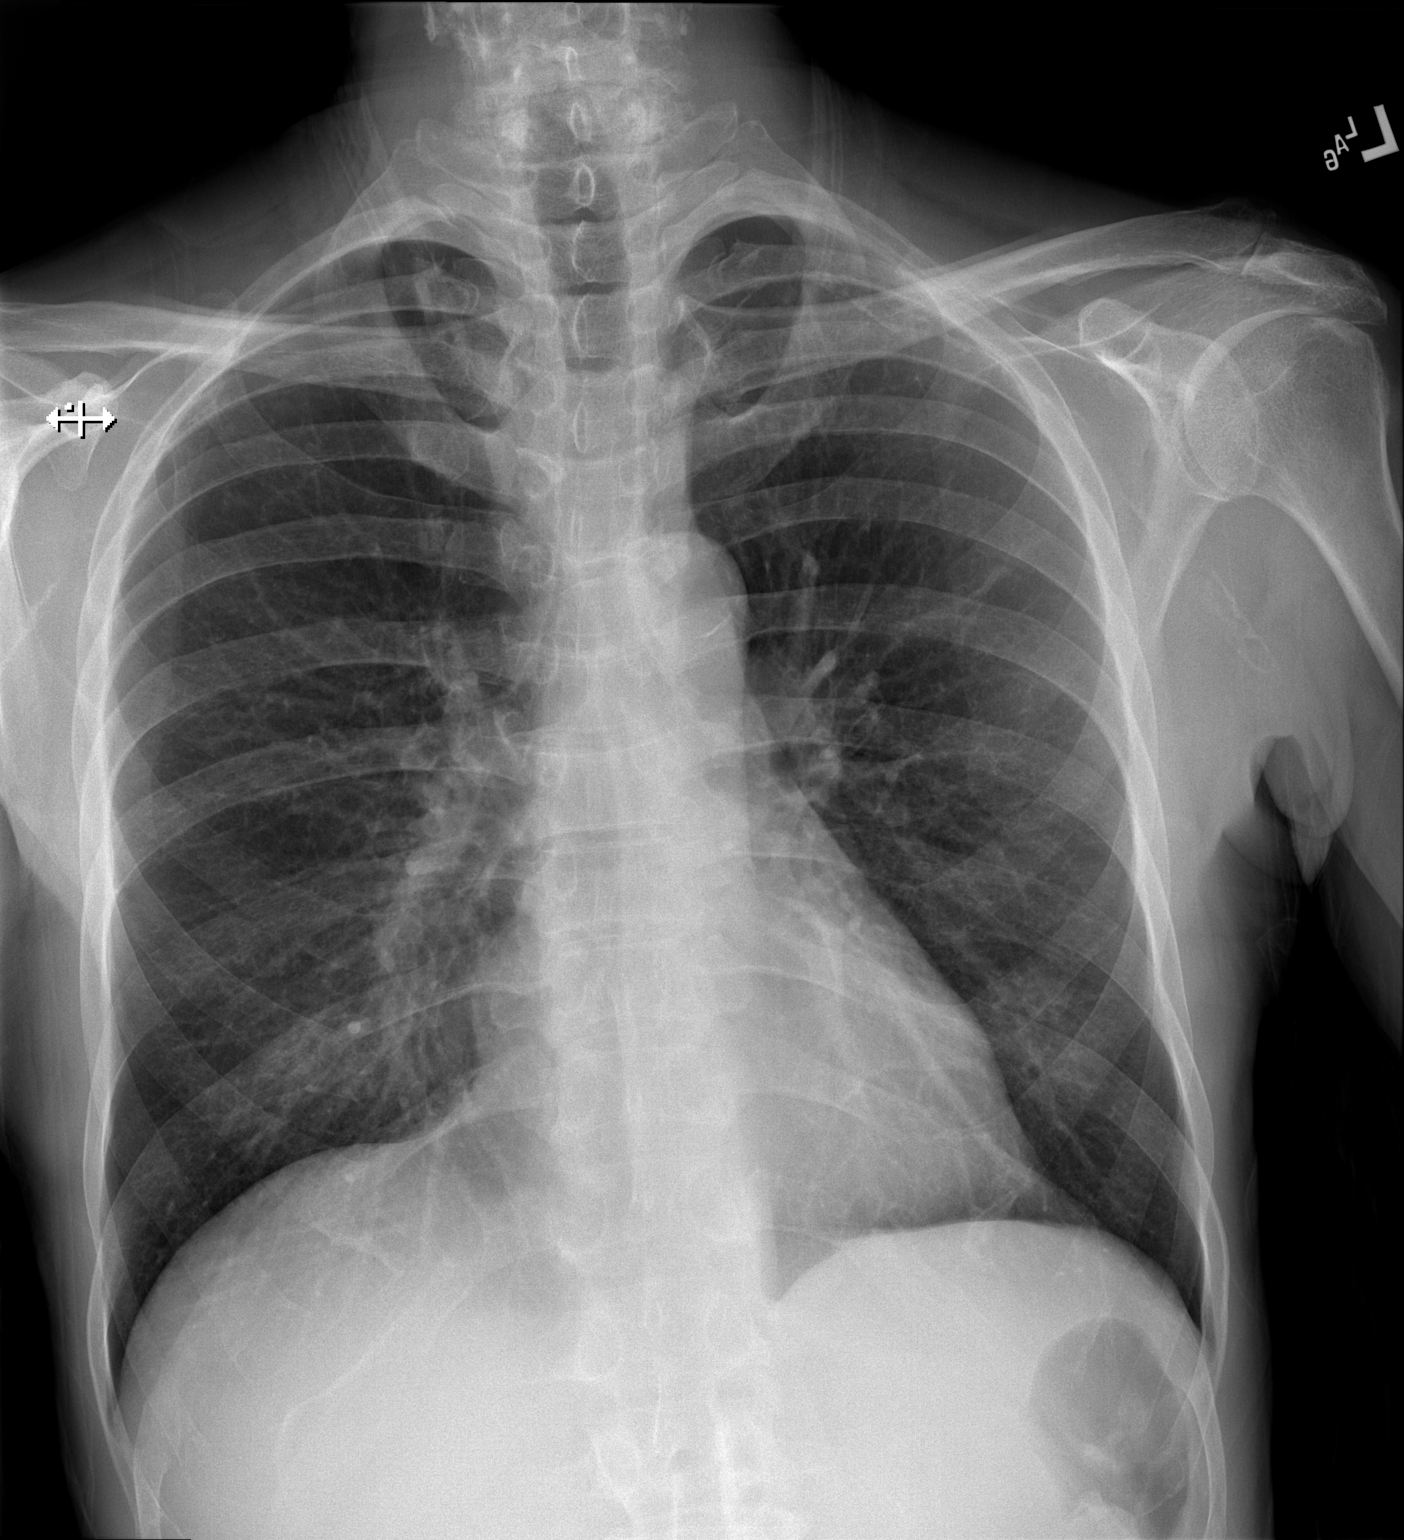

[w chest lat]
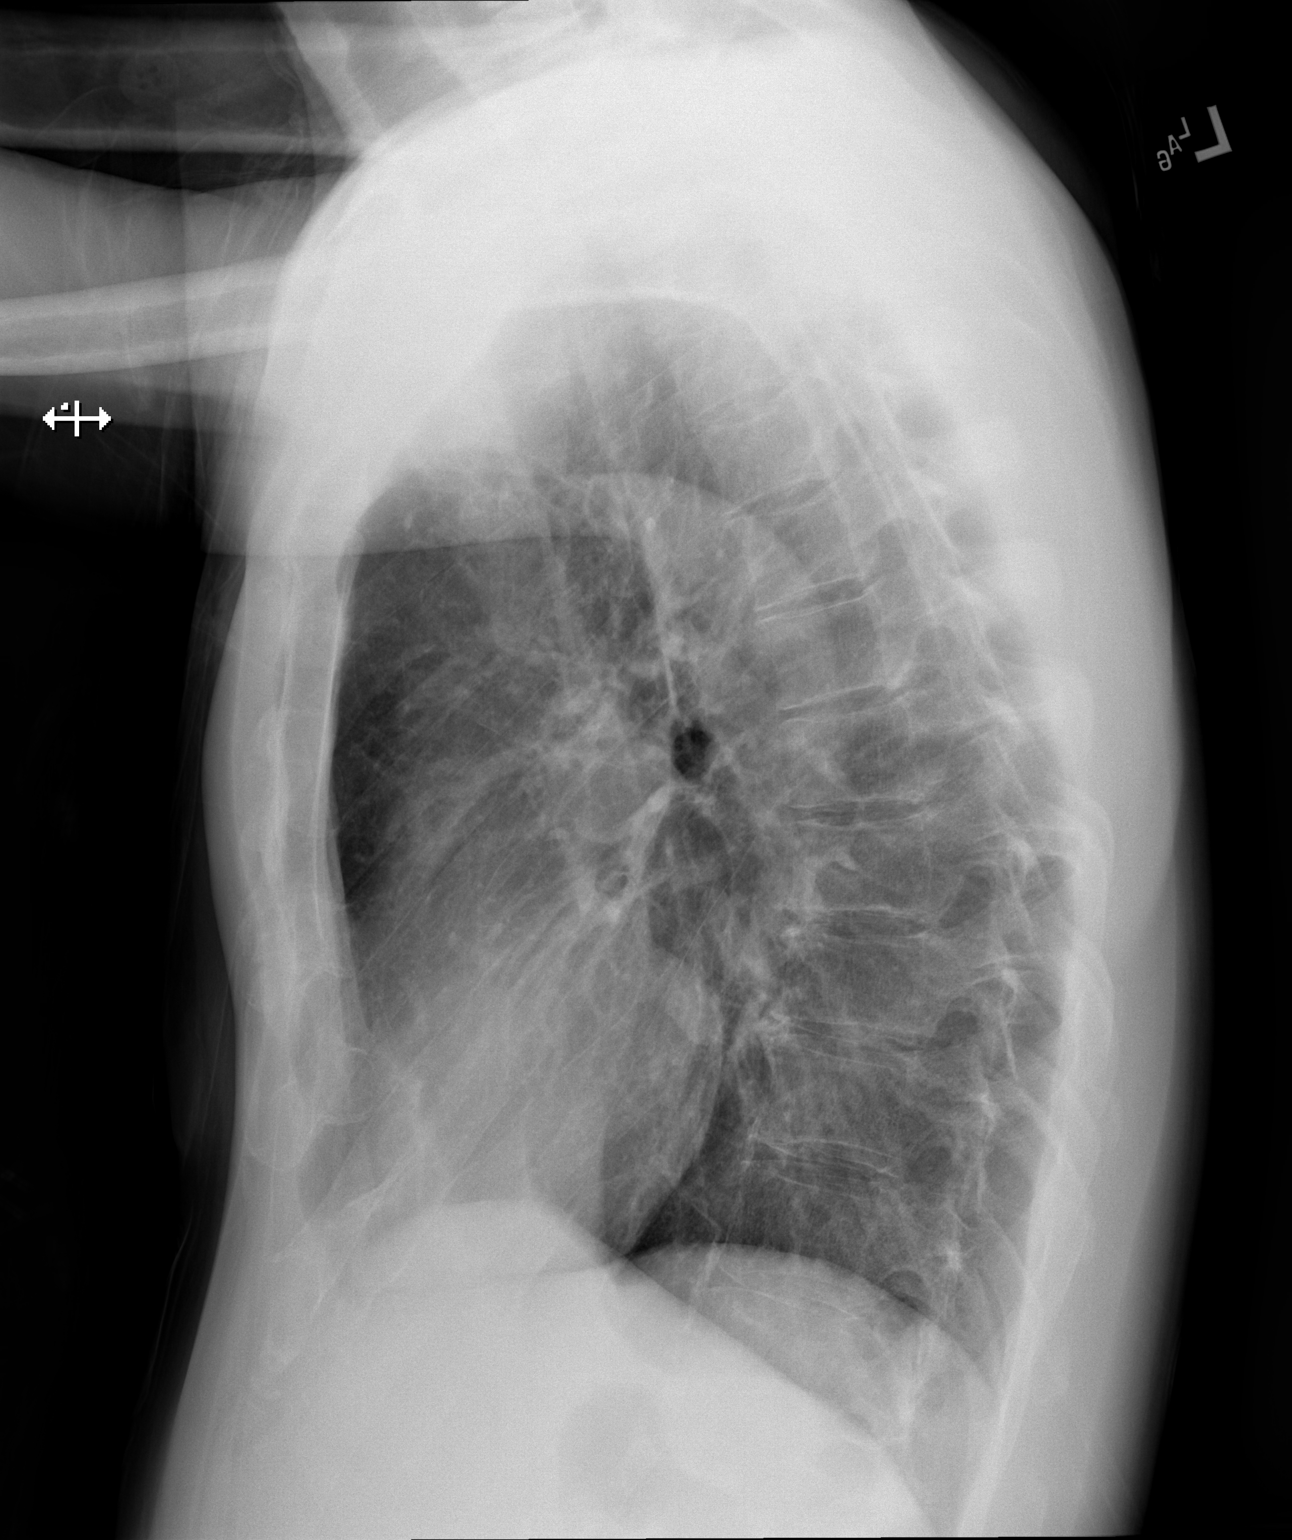

[2 of 2 positions shown; findings below may reference images not displayed]

FINDINGS: Lungs are clear. No pneumothorax or pleural effusion. Cardiac size
within normal limits. Pulmonary vascularity is normal. Advanced
vascular calcifications are seen within the a left subclavian artery
and axillary arteries bilaterally. No acute bone abnormality.
IMPRESSION: No active cardiopulmonary disease.

## 2021-04-10 DIAGNOSIS — R32 Unspecified urinary incontinence: Secondary | ICD-10-CM | POA: Diagnosis not present

## 2021-05-18 DIAGNOSIS — R32 Unspecified urinary incontinence: Secondary | ICD-10-CM | POA: Diagnosis not present

## 2021-06-19 ENCOUNTER — Encounter (HOSPITAL_COMMUNITY): Payer: Self-pay | Admitting: Emergency Medicine

## 2021-06-19 ENCOUNTER — Other Ambulatory Visit: Payer: Self-pay

## 2021-06-19 ENCOUNTER — Emergency Department (HOSPITAL_COMMUNITY)
Admission: EM | Admit: 2021-06-19 | Discharge: 2021-06-20 | Disposition: A | Discharge status: Home / Self Care | Payer: Medicaid Other | Attending: Emergency Medicine | Admitting: Emergency Medicine

## 2021-06-19 DIAGNOSIS — N50812 Left testicular pain: Secondary | ICD-10-CM | POA: Insufficient documentation

## 2021-06-19 DIAGNOSIS — N50811 Right testicular pain: Secondary | ICD-10-CM | POA: Diagnosis not present

## 2021-06-19 DIAGNOSIS — K4090 Unilateral inguinal hernia, without obstruction or gangrene, not specified as recurrent: Secondary | ICD-10-CM | POA: Diagnosis not present

## 2021-06-19 DIAGNOSIS — R102 Pelvic and perineal pain: Secondary | ICD-10-CM | POA: Diagnosis present

## 2021-06-19 DIAGNOSIS — Z760 Encounter for issue of repeat prescription: Secondary | ICD-10-CM

## 2021-06-19 DIAGNOSIS — I861 Scrotal varices: Secondary | ICD-10-CM | POA: Diagnosis not present

## 2021-06-19 DIAGNOSIS — N433 Hydrocele, unspecified: Secondary | ICD-10-CM | POA: Diagnosis not present

## 2021-06-19 LAB — URINALYSIS, ROUTINE W REFLEX MICROSCOPIC
Bilirubin Urine: NEGATIVE
Glucose, UA: NEGATIVE mg/dL
Hgb urine dipstick: NEGATIVE
Ketones, ur: NEGATIVE mg/dL
Leukocytes,Ua: NEGATIVE
Nitrite: NEGATIVE
Protein, ur: NEGATIVE mg/dL
Specific Gravity, Urine: 1.005 (ref 1.005–1.030)
pH: 6 (ref 5.0–8.0)

## 2021-06-19 LAB — CBC WITH DIFFERENTIAL/PLATELET
Abs Immature Granulocytes: 0.03 10*3/uL (ref 0.00–0.07)
Basophils Absolute: 0 10*3/uL (ref 0.0–0.1)
Basophils Relative: 0 %
Eosinophils Absolute: 0.1 10*3/uL (ref 0.0–0.5)
Eosinophils Relative: 1 %
HCT: 45.2 % (ref 39.0–52.0)
Hemoglobin: 14.7 g/dL (ref 13.0–17.0)
Immature Granulocytes: 1 %
Lymphocytes Relative: 57 %
Lymphs Abs: 3.8 10*3/uL (ref 0.7–4.0)
MCH: 30.9 pg (ref 26.0–34.0)
MCHC: 32.5 g/dL (ref 30.0–36.0)
MCV: 95.2 fL (ref 80.0–100.0)
Monocytes Absolute: 0.6 10*3/uL (ref 0.1–1.0)
Monocytes Relative: 8 %
Neutro Abs: 2.2 10*3/uL (ref 1.7–7.7)
Neutrophils Relative %: 33 %
Platelets: 81 10*3/uL — ABNORMAL LOW (ref 150–400)
RBC: 4.75 MIL/uL (ref 4.22–5.81)
RDW: 13.7 % (ref 11.5–15.5)
WBC: 6.6 10*3/uL (ref 4.0–10.5)
nRBC: 0.3 % — ABNORMAL HIGH (ref 0.0–0.2)

## 2021-06-19 LAB — LIPASE, BLOOD: Lipase: 48 U/L (ref 11–51)

## 2021-06-19 LAB — COMPREHENSIVE METABOLIC PANEL
ALT: 42 U/L (ref 0–44)
AST: 50 U/L — ABNORMAL HIGH (ref 15–41)
Albumin: 2.9 g/dL — ABNORMAL LOW (ref 3.5–5.0)
Alkaline Phosphatase: 83 U/L (ref 38–126)
Anion gap: 9 (ref 5–15)
BUN: 8 mg/dL (ref 8–23)
CO2: 23 mmol/L (ref 22–32)
Calcium: 7.9 mg/dL — ABNORMAL LOW (ref 8.9–10.3)
Chloride: 100 mmol/L (ref 98–111)
Creatinine, Ser: 0.91 mg/dL (ref 0.61–1.24)
GFR, Estimated: >60 mL/min (ref >60)
Glucose, Bld: 125 mg/dL — ABNORMAL HIGH (ref 70–99)
Potassium: 3.2 mmol/L — ABNORMAL LOW (ref 3.5–5.1)
Sodium: 132 mmol/L — ABNORMAL LOW (ref 135–145)
Total Bilirubin: 0.8 mg/dL (ref 0.3–1.2)
Total Protein: 7 g/dL (ref 6.5–8.1)

## 2021-06-19 NOTE — ED Provider Triage Note (Signed)
Emergency Medicine Provider Triage Evaluation Note  Albert Salazar , a 66 y.o. male  was evaluated in triage.  Pt complains of constipation.  States that he has not had a bowel movement in 1 day.  Also concerned that he has not urinated in a day.  States that he got evicted from his home and has not been able to take his Flomax.  Patient refuses to tell me more information as he states "I don't want to deal with you all today."  Review of Systems  Positive: Abdominal pain, constipation, urinary retention Negative: Fever  Physical Exam  BP (!) 165/90   Pulse 80   Temp 97.6 F (36.4 C)   Resp 16   SpO2 99%  Gen:   Awake, no distress   Resp:  Normal effort  MSK:   Moves extremities without difficulty  Other:    Medical Decision Making  Medically screening exam initiated at 9:49 PM.  Appropriate orders placed.  Albert Salazar was informed that the remainder of the evaluation will be completed by another provider, this initial triage assessment does not replace that evaluation, and the importance of remaining in the ED until their evaluation is complete.  Order labs and bladder   Dietrich Pates, PA-C 06/19/21 2149

## 2021-06-19 NOTE — ED Triage Notes (Signed)
Patient reports pain at right lower abdomen / right groin onset today , denies emesis or diarrhea , no fever or chills , patient stated history of inguinal hernia .

## 2021-06-20 ENCOUNTER — Emergency Department (HOSPITAL_COMMUNITY): Payer: Medicaid Other

## 2021-06-20 DIAGNOSIS — K4090 Unilateral inguinal hernia, without obstruction or gangrene, not specified as recurrent: Secondary | ICD-10-CM | POA: Diagnosis not present

## 2021-06-20 DIAGNOSIS — N433 Hydrocele, unspecified: Secondary | ICD-10-CM | POA: Diagnosis not present

## 2021-06-20 DIAGNOSIS — I861 Scrotal varices: Secondary | ICD-10-CM | POA: Diagnosis not present

## 2021-06-20 MED ORDER — TAMSULOSIN HCL 0.4 MG PO CAPS
0.4000 mg | ORAL_CAPSULE | Freq: Every day | ORAL | 0 refills | Status: DC
Start: 2021-06-20 — End: 2023-08-20

## 2021-06-20 MED ORDER — SODIUM CHLORIDE 0.9 % IV BOLUS
500.0000 mL | Freq: Once | INTRAVENOUS | Status: AC
  Administered 2021-06-20: 500 mL via INTRAVENOUS

## 2021-06-20 MED ORDER — TAMSULOSIN HCL 0.4 MG PO CAPS
0.4000 mg | ORAL_CAPSULE | Freq: Once | ORAL | Status: AC
  Administered 2021-06-20: 0.4 mg via ORAL
  Filled 2021-06-20: qty 1

## 2021-06-20 NOTE — ED Notes (Signed)
Bladder scanned pt. Pt has in bladder. Pt given urinal and told to try to use bathroom.

## 2021-06-20 NOTE — ED Provider Notes (Signed)
[facility] EMERGENCY DEPARTMENT Provider Note   CSN: [id number] Arrival date & time: 06/19/21  2055     History  Chief Complaint  Patient presents with   Abdominal Pain    Groin Pain     Albert Salazar is a 66 y.o. male.  The history is provided by the patient and medical records.  Abdominal Pain Albert Salazar is a 66 y.o. male who presents to the Emergency Department complaining of pelvic pain.  He presents to the emergency department complaining of 3 days of severe bilateral testicular pain.  He states that he has the pain due to running out of his Flomax 5 days ago.  He is able to urinate but has to sit down to urinate.  He does have a known right inguinal hernia.  No associated fevers, abdominal pain, nausea, vomiting.  Pain is severe, constant and worsening.  He is scheduled for hernia repair on June 1 of this year.    Home Medications Prior to Admission medications   Medication Sig Start Date End Date Taking? Authorizing Provider  meloxicam (MOBIC) 7.5 MG tablet Take 1 tablet (7.5 mg total) by mouth daily. Patient not taking: Reported on 02/04/2021 04/28/20   Gailen Shelter, PA  methocarbamol (ROBAXIN) 500 MG tablet Take 1 tablet (500 mg total) by mouth 2 (two) times daily. Patient not taking: Reported on 02/04/2021 04/28/20   Gailen Shelter, PA  oxyCODONE-acetaminophen (PERCOCET) 5-325 MG tablet Take 1-2 tablets by mouth every 4 (four) hours as needed. Patient not taking: Reported on 02/04/2021 12/27/19   Arby Barrette, MD  polyethylene glycol (MIRALAX) 17 g packet Take 17 g by mouth daily. Patient not taking: Reported on 02/04/2021 05/14/19   Benjiman Core, MD  tamsulosin (FLOMAX) 0.4 MG CAPS capsule Take 1 capsule (0.4 mg total) by mouth daily. 06/20/21   Tilden Fossa, MD      Allergies    Patient has no known allergies.    Review of Systems   Review of Systems  Gastrointestinal:  Positive for abdominal pain.  All other systems reviewed and are  negative.  Physical Exam Updated Vital Signs BP (!) 158/74   Pulse 78   Temp 97.6 F (36.4 C)   Resp 16   SpO2 100%  Physical Exam Vitals and nursing note reviewed.  Constitutional:      Appearance: He is well-developed.  HENT:     Head: Normocephalic and atraumatic.  Cardiovascular:     Rate and Rhythm: Normal rate and regular rhythm.     Heart sounds: No murmur heard. Pulmonary:     Effort: Pulmonary effort is normal. No respiratory distress.     Breath sounds: Normal breath sounds.  Abdominal:     Palpations: Abdomen is soft.     Tenderness: There is no abdominal tenderness. There is no guarding or rebound.  Genitourinary:    Comments: Uncircumcised male.  There is a large right inguinal hernia.  This can be reduced with firm gentle pressure.  After reduction of the hernia there is still fullness to the right scrotum.  There is no significant tenderness to palpation in the scrotum. Musculoskeletal:        General: No tenderness.  Skin:    General: Skin is warm and dry.  Neurological:     Mental Status: He is alert and oriented to person, place, and time.  Psychiatric:        Behavior: Behavior normal.    ED Results / Procedures /  Treatments   Labs (all labs ordered are listed, but only abnormal results are displayed) Labs Reviewed  COMPREHENSIVE METABOLIC PANEL - Abnormal; Notable for the following components:      Result Value   Sodium 132 (*)    Potassium 3.2 (*)    Glucose, Bld 125 (*)    Calcium 7.9 (*)    Albumin 2.9 (*)    AST 50 (*)    All other components within normal limits  CBC WITH DIFFERENTIAL/PLATELET - Abnormal; Notable for the following components:   Platelets 81 (*)    nRBC 0.3 (*)    All other components within normal limits  URINE CULTURE  LIPASE, BLOOD  URINALYSIS, ROUTINE W REFLEX MICROSCOPIC    EKG None  Radiology US SCROTUM W/DOPPLER  Result Date: 06/20/2021 CLINICAL DATA:  Testicle pain EXAM: SCROTAL ULTRASOUND DOPPLER  ULTRASOUND OF THE TESTICLES TECHNIQUE: Complete ultrasound examination of the testicles, epididymis, and other scrotal structures was performed. Color and spectral Doppler ultrasound were also utilized to evaluate blood flow to the testicles. COMPARISON:  Pelvis CT 02/04/2021 FINDINGS: Right testicle Measurements: 33 x 25 x 24 mm.  No mass or abnormal vascularity. Left testicle Measurements:  34 x 17 x 25 mm.  No mass or abnormal vascularity. Right epididymis:  Normal in size and appearance. Left epididymis:  Normal in size and appearance. Hydrocele:  Small volume on the right Varicocele: Borderline on the left with veins measuring nearly 3 mm. Pulsed Doppler interrogation of both testes demonstrates normal low resistance arterial and venous waveforms bilaterally. Other: Peristalsing bowel is seen within the right groin hernia above the right testicle. IMPRESSION: 1. Normal appearance of the testicles. 2. Bowel containing right groin hernia, known. Electronically Signed   By: Tiburcio Pea M.D.   On: 06/20/2021 04:26    Procedures Hernia reduction  Date/Time: 06/20/2021 6:59 AM Performed by: Tilden Fossa, MD Authorized by: Tilden Fossa, MD  Consent: Verbal consent obtained. Consent given by: patient Patient understanding: patient states understanding of the procedure being performed Patient identity confirmed: verbally with patient Local anesthesia used: no  Anesthesia: Local anesthesia used: no  Sedation: Patient sedated: no  Patient tolerance: patient tolerated the procedure well with no immediate complications      Medications Ordered in ED Medications  tamsulosin (FLOMAX) capsule 0.4 mg (0.4 mg Oral Given 06/20/21 0315)  sodium chloride 0.9 % bolus 500 mL (0 mLs Intravenous Stopped 06/20/21 0419)    ED Course/ Medical Decision Making/ A&P                           Medical Decision Making Amount and/or Complexity of Data Reviewed Radiology: ordered.  Risk Prescription  drug management.   Patient with history of BPH, alcohol abuse, inguinal hernia here for evaluation of groin pain and request for refill on his Flomax.  He has a very large right inguinal hernia on evaluation that is easily reducible.  There is some fullness to the right testicle following reduction of his hernia.  He does complain of testicular pain.  A testicular ultrasound was obtained, which demonstrates his hernia, no complicated fluid collections or evidence of testicular mass.  He did initially have urinary retention.  He was given a dose of his home Flomax, which he has been out of for 5 days.  After giving him this medication he was able to empty his bladder.  His testicular pain did resolve.  Labs with stable thrombocytopenia when compared  to priors.  Patient is scheduled for hernia repair on June 1.  Given no current evidence of incarceration, obstruction, acute infection feel he is stable to discharge home with continued plan to follow-up with surgery.  We will represcribe his Flomax.  Discussed home care for urinary retention as well as inguinal hernia.  Return precautions discussed.        Final Clinical Impression(s) / ED Diagnoses Final diagnoses:  Non-recurrent unilateral inguinal hernia without obstruction or gangrene  Encounter for medication refill    Rx / DC Orders ED Discharge Orders          Ordered    tamsulosin (FLOMAX) 0.4 MG CAPS capsule  Daily        06/20/21 0643              Tilden Fossaees, Rakel Junio, MD 06/20/21 [id number]

## 2021-06-21 LAB — URINE CULTURE: Culture: NO GROWTH

## 2021-06-29 ENCOUNTER — Ambulatory Visit: Payer: Self-pay | Admitting: Surgery

## 2021-06-29 DIAGNOSIS — K4090 Unilateral inguinal hernia, without obstruction or gangrene, not specified as recurrent: Secondary | ICD-10-CM | POA: Diagnosis not present

## 2021-06-29 DIAGNOSIS — K7030 Alcoholic cirrhosis of liver without ascites: Secondary | ICD-10-CM

## 2021-06-29 DIAGNOSIS — Z09 Encounter for follow-up examination after completed treatment for conditions other than malignant neoplasm: Secondary | ICD-10-CM | POA: Diagnosis not present

## 2021-06-29 DIAGNOSIS — K7031 Alcoholic cirrhosis of liver with ascites: Secondary | ICD-10-CM | POA: Diagnosis not present

## 2021-06-29 NOTE — H&P (View-Only) (Signed)
Albert Salazar [id number]   Referring Provider:  Self   Subjective   Chief Complaint: Follow-up (Inguinal hernia)     History of Present Illness: Scheduled follow-up for large inguinal scrotal hernia.  He has been to the emergency room twice, once in January and once about a week ago for pain related to the hernia and running out of his Flomax leading to some urinary retention.  He has a little bit of confusion about the relationship between his Flomax/prostate and hernia (and that there is not a relationship between these things)... Which I did try to clear up for him.  The hernia remains reducible when he is supine.  He does note that he is still drinking alcohol from time to time.  Repeat CT scan in January did show bowel within the hernia and markedly enlarged prostate with a masslike projection towards the bladder.  Scrotal ultrasound last week notes bowel containing right inguinal hernia with small volume hydrocele.  I have reviewed his CT from January and his ascites did look markedly better.  Initial visit 12/20/2020: 66 year old gentleman with history of cirrhosis, alcohol abuse, tobacco abuse, hepatitis C, depression, polysubstance abuse, bipolar disorder who presents for evaluation of a right inguinal hernia.   He is referred by Dr. Mena Goes at [facility] where he is followed for history of urinary retention and was last seen at the end of October for the same.  He reports his symptoms for this are little bit better He states that the hernia is becoming more painful and getting larger as time goes by.  It does keep him from doing some things that he wants to do.  It protrudes significantly when he is upright but is easily reducible.  He has not had any obstructive symptoms or incarceration episodes.  No previous abdominal or hernia surgery.  He notes that he does smoke about a half a pack a day but is willing to consider quitting.  He does occasionally drink or use other  drugs.   Review of Systems: A complete review of systems was obtained from the patient.  I have reviewed this information and discussed as appropriate with the patient.  See HPI as well for other ROS.   Medical History: Past Medical History:  Diagnosis Date   Liver disease     There is no problem list on file for this patient.   History reviewed. No pertinent surgical history.   No Known Allergies  No current outpatient medications on file prior to visit.   No current facility-administered medications on file prior to visit.    History reviewed. No pertinent family history.   Social History   Tobacco Use  Smoking Status Unknown  Smokeless Tobacco Not on file     Social History   Socioeconomic History   Marital status: Single  Tobacco Use   Smoking status: Unknown  Substance and Sexual Activity   Alcohol use: Yes    Comment: unknown   Drug use: Never    Objective:    There were no vitals filed for this visit.  There is no height or weight on file to calculate BMI.  Alert, calm, cooperative Slight ketotic odor Unlabored respirations Abdomen is soft, minimally distended with ascites, moderate right inguinal hernia which reduces completely with mild pressure when the patient is supine   Labs, Imaging and Diagnostic Testing: Scrotal US 05/30/18: No focal testicular abnormalities.   Heterogeneous appearing mildly enlarged LEFT epididymal head without discrete mass or hypervascularity, question sequela of  prior epididymitis.   Small LEFT hydrocele with large multiloculated fluid collection within the RIGHT hemiscrotum in the RIGHT inguinal canal.   In combination with the prior CT exam, this likely represents a patent RIGHT inguinal canal with extension of significant ascites from the peritoneal cavity of the pelvis through the RIGHT inguinal canal into the RIGHT hemiscrotum, displacing the RIGHT testis to the RIGHT.  CT 05/27/2018: 1. Cirrhosis with  ascites and distal paraesophageal varices. Moderate to large volume ascites.  No discrete liver lesion. 2. Ascites tracks into the right hemiscrotum via an inguinal hernia. There might be superimposed small scrotal hydroceles. 3. Urinary bladder is decompressed by a Foley catheter. Renal enhancement and contrast excretion appears normal. 4. Extensive Aortic Atherosclerosis (ICD10-I70.0) and other calcified atherosclerosis. Hemodynamically significant iliac and femoral artery stenoses suspected  IMAGE WILL NOT TRANSFER  Vs:  CT 02/04/21  1. Moderate right inguinal hernia containing mesenteric fat and small bowel but no obstructive changes. Small amount of fluid in the right hernia sac but no other suspicious features for incarceration. 2. Markedly enlarged prostate with masslike projection towards the bladder.   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  We referred to GI for further management of his cirrhosis and recommended that he acquire a hernia belt for at least a scrotal support garment in the interim, and he is now here for his planned 12-month follow-up.  It does not seem as though he did any of the things that we recommended but fortunately his ascites does seem to be somewhat better.  At this point he has been to the emergency room twice with incarceration of his hernia containing bowel and I do think that it would be best to proceed with elective repair if we can.  I discussed with him that his risk of complications is much higher than the average person secondary to his cirrhosis and we discussed issues like bleeding,  infection, pain, scarring, injury to intra-abdominal or retroperitoneal structures, wound healing problems, hernia recurrence, chronic groin pain, and exacerbation of his cirrhosis/transition to decompensated cirrhosis.  Questions were welcomed and answered.  Patient wishes to proceed with surgery.   Yael Coppess Carlye Grippe, MD

## 2021-06-29 NOTE — H&P (Signed)
Albert Salazar [id number]   Referring Provider:  Self   Subjective   Chief Complaint: Follow-up (Inguinal hernia)     History of Present Illness: Scheduled follow-up for large inguinal scrotal hernia.  He has been to the emergency room twice, once in January and once about a week ago for pain related to the hernia and running out of his Flomax leading to some urinary retention.  He has a little bit of confusion about the relationship between his Flomax/prostate and hernia (and that there is not a relationship between these things)... Which I did try to clear up for him.  The hernia remains reducible when he is supine.  He does note that he is still drinking alcohol from time to time.  Repeat CT scan in January did show bowel within the hernia and markedly enlarged prostate with a masslike projection towards the bladder.  Scrotal ultrasound last week notes bowel containing right inguinal hernia with small volume hydrocele.  I have reviewed his CT from January and his ascites did look markedly better.  Initial visit 12/20/2020: 66 year old gentleman with history of cirrhosis, alcohol abuse, tobacco abuse, hepatitis C, depression, polysubstance abuse, bipolar disorder who presents for evaluation of a right inguinal hernia.   He is referred by Dr. Mena Goes at [facility] u[facility] where he is followed for history of urinary retention and was last seen at the end of October for the same.  He reports his symptoms for this are little bit better He states that the hernia is becoming more painful and getting larger as time goes by.  It does keep him from doing some things that he wants to do.  It protrudes significantly when he is upright but is easily reducible.  He has not had any obstructive symptoms or incarceration episodes.  No previous abdominal or hernia surgery.  He notes that he does smoke about a half a pack a day but is willing to consider quitting.  He does occasionally drink or use other  drugs.   Review of Systems: A complete review of systems was obtained from the patient.  I have reviewed this information and discussed as appropriate with the patient.  See HPI as well for other ROS.   Medical History: Past Medical History:  Diagnosis Date   Liver disease     There is no problem list on file for this patient.   History reviewed. No pertinent surgical history.   No Known Allergies  No current outpatient medications on file prior to visit.   No current facility-administered medications on file prior to visit.    History reviewed. No pertinent family history.   Social History   Tobacco Use  Smoking Status Unknown  Smokeless Tobacco Not on file     Social History   Socioeconomic History   Marital status: Single  Tobacco Use   Smoking status: Unknown  Substance and Sexual Activity   Alcohol use: Yes    Comment: unknown   Drug use: Never    Objective:    There were no vitals filed for this visit.  There is no height or weight on file to calculate BMI.  Alert, calm, cooperative Slight ketotic odor Unlabored respirations Abdomen is soft, minimally distended with ascites, moderate right inguinal hernia which reduces completely with mild pressure when the patient is supine   Labs, Imaging and Diagnostic Testing: Scrotal US 05/30/18: No focal testicular abnormalities.   Heterogeneous appearing mildly enlarged LEFT epididymal head without discrete mass or hypervascularity, question sequela of  prior epididymitis.   Small LEFT hydrocele with large multiloculated fluid collection within the RIGHT hemiscrotum in the RIGHT inguinal canal.   In combination with the prior CT exam, this likely represents a patent RIGHT inguinal canal with extension of significant ascites from the peritoneal cavity of the pelvis through the RIGHT inguinal canal into the RIGHT hemiscrotum, displacing the RIGHT testis to the RIGHT.  CT 05/27/2018: 1. Cirrhosis with  ascites and distal paraesophageal varices. Moderate to large volume ascites.  No discrete liver lesion. 2. Ascites tracks into the right hemiscrotum via an inguinal hernia. There might be superimposed small scrotal hydroceles. 3. Urinary bladder is decompressed by a Foley catheter. Renal enhancement and contrast excretion appears normal. 4. Extensive Aortic Atherosclerosis (ICD10-I70.0) and other calcified atherosclerosis. Hemodynamically significant iliac and femoral artery stenoses suspected  IMAGE WILL NOT TRANSFER  Vs:  CT 02/04/21  1. Moderate right inguinal hernia containing mesenteric fat and small bowel but no obstructive changes. Small amount of fluid in the right hernia sac but no other suspicious features for incarceration. 2. Markedly enlarged prostate with masslike projection towards the bladder.   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  We referred to GI for further management of his cirrhosis and recommended that he acquire a hernia belt for at least a scrotal support garment in the interim, and he is now here for his planned 12-month follow-up.  It does not seem as though he did any of the things that we recommended but fortunately his ascites does seem to be somewhat better.  At this point he has been to the emergency room twice with incarceration of his hernia containing bowel and I do think that it would be best to proceed with elective repair if we can.  I discussed with him that his risk of complications is much higher than the average person secondary to his cirrhosis and we discussed issues like bleeding,  infection, pain, scarring, injury to intra-abdominal or retroperitoneal structures, wound healing problems, hernia recurrence, chronic groin pain, and exacerbation of his cirrhosis/transition to decompensated cirrhosis.  Questions were welcomed and answered.  Patient wishes to proceed with surgery.   Laiken Sandy Carlye Grippe, MD

## 2021-06-30 ENCOUNTER — Encounter (HOSPITAL_COMMUNITY): Payer: Self-pay | Admitting: Surgery

## 2021-06-30 NOTE — Progress Notes (Addendum)
For Short Stay: COVID SWAB appointment date: Date of COVID positive in last 90 days:  Bowel Prep reminder:   For Anesthesia: PCP - Wilmer Floor, NP Cardiologist -   Chest x-ray - 02/04/21 in epic EKG - 02/04/21 in epic Stress Test -  ECHO -  Cardiac Cath -  Pacemaker/ICD device last checked: Pacemaker orders received: Device Rep notified:  Spinal Cord Stimulator:  Sleep Study -  CPAP -   Fasting Blood Sugar -  Checks Blood Sugar _____ times a day Date and result of last Hgb A1c-  Blood Thinner Instructions: Aspirin Instructions: Last Dose:  Activity level: Can go up a flight of stairs and activities of daily living without stopping and without chest pain and/or shortness of breath   Able to exercise without chest pain and/or shortness of breath   Unable to go up a flight of stairs without chest pain and/or shortness of breath     Anesthesia review: liver cirrhosis, alcohol abuse  Patient denies shortness of breath, fever, cough and chest pain at PAT appointment   Patient verbalized understanding of instructions that were given to them at the PAT appointment. Patient was also instructed that they will need to review over the PAT instructions again at home before surgery.

## 2021-07-03 NOTE — Progress Notes (Signed)
Unable to reach pt per number listed; spoke with pts contact Albert Salazar whom stated they are aware to arrive at [facility] admitting on Tuesday 07/04/2021 0930  for scheduled procedure. No food after midnight; clear liquids from midnight till 0845 then nothing by mouth.

## 2021-07-04 ENCOUNTER — Other Ambulatory Visit: Payer: Self-pay

## 2021-07-04 ENCOUNTER — Ambulatory Visit (HOSPITAL_COMMUNITY)
Admission: RE | Admit: 2021-07-04 | Discharge: 2021-07-04 | Disposition: A | Discharge status: Home / Self Care | Payer: Medicaid Other | Attending: Surgery | Admitting: Surgery

## 2021-07-04 ENCOUNTER — Ambulatory Visit (HOSPITAL_COMMUNITY): Payer: Medicaid Other | Admitting: Certified Registered Nurse Anesthetist

## 2021-07-04 ENCOUNTER — Encounter (HOSPITAL_COMMUNITY)
Admission: RE | Disposition: A | Discharge status: Home / Self Care | Payer: Self-pay | Source: Home / Self Care | Attending: Surgery

## 2021-07-04 ENCOUNTER — Encounter (HOSPITAL_COMMUNITY): Payer: Self-pay | Admitting: Surgery

## 2021-07-04 ENCOUNTER — Ambulatory Visit (HOSPITAL_BASED_OUTPATIENT_CLINIC_OR_DEPARTMENT_OTHER): Payer: Medicaid Other | Admitting: Certified Registered Nurse Anesthetist

## 2021-07-04 DIAGNOSIS — L905 Scar conditions and fibrosis of skin: Secondary | ICD-10-CM | POA: Insufficient documentation

## 2021-07-04 DIAGNOSIS — K7460 Unspecified cirrhosis of liver: Secondary | ICD-10-CM | POA: Diagnosis not present

## 2021-07-04 DIAGNOSIS — K4090 Unilateral inguinal hernia, without obstruction or gangrene, not specified as recurrent: Secondary | ICD-10-CM | POA: Diagnosis not present

## 2021-07-04 DIAGNOSIS — Z8619 Personal history of other infectious and parasitic diseases: Secondary | ICD-10-CM | POA: Insufficient documentation

## 2021-07-04 DIAGNOSIS — D649 Anemia, unspecified: Secondary | ICD-10-CM

## 2021-07-04 DIAGNOSIS — K7030 Alcoholic cirrhosis of liver without ascites: Secondary | ICD-10-CM

## 2021-07-04 DIAGNOSIS — F319 Bipolar disorder, unspecified: Secondary | ICD-10-CM | POA: Diagnosis not present

## 2021-07-04 DIAGNOSIS — B1920 Unspecified viral hepatitis C without hepatic coma: Secondary | ICD-10-CM | POA: Diagnosis not present

## 2021-07-04 DIAGNOSIS — K7031 Alcoholic cirrhosis of liver with ascites: Secondary | ICD-10-CM | POA: Diagnosis not present

## 2021-07-04 DIAGNOSIS — N432 Other hydrocele: Secondary | ICD-10-CM | POA: Insufficient documentation

## 2021-07-04 DIAGNOSIS — N433 Hydrocele, unspecified: Secondary | ICD-10-CM | POA: Diagnosis not present

## 2021-07-04 DIAGNOSIS — F17210 Nicotine dependence, cigarettes, uncomplicated: Secondary | ICD-10-CM | POA: Insufficient documentation

## 2021-07-04 HISTORY — DX: Thrombocytopenia, unspecified: D69.6

## 2021-07-04 HISTORY — PX: INGUINAL HERNIA REPAIR: SHX194

## 2021-07-04 LAB — COMPREHENSIVE METABOLIC PANEL
ALT: 49 U/L — ABNORMAL HIGH (ref 0–44)
AST: 57 U/L — ABNORMAL HIGH (ref 15–41)
Albumin: 3.2 g/dL — ABNORMAL LOW (ref 3.5–5.0)
Alkaline Phosphatase: 103 U/L (ref 38–126)
Anion gap: 10 (ref 5–15)
BUN: 17 mg/dL (ref 8–23)
CO2: 24 mmol/L (ref 22–32)
Calcium: 8.9 mg/dL (ref 8.9–10.3)
Chloride: 107 mmol/L (ref 98–111)
Creatinine, Ser: 0.9 mg/dL (ref 0.61–1.24)
GFR, Estimated: >60 mL/min (ref >60)
Glucose, Bld: 144 mg/dL — ABNORMAL HIGH (ref 70–99)
Potassium: 3.4 mmol/L — ABNORMAL LOW (ref 3.5–5.1)
Sodium: 141 mmol/L (ref 135–145)
Total Bilirubin: 1.1 mg/dL (ref 0.3–1.2)
Total Protein: 7.7 g/dL (ref 6.5–8.1)

## 2021-07-04 LAB — CBC
HCT: 48.1 % (ref 39.0–52.0)
Hemoglobin: 15.6 g/dL (ref 13.0–17.0)
MCH: 31.4 pg (ref 26.0–34.0)
MCHC: 32.4 g/dL (ref 30.0–36.0)
MCV: 96.8 fL (ref 80.0–100.0)
Platelets: 95 10*3/uL — ABNORMAL LOW (ref 150–400)
RBC: 4.97 MIL/uL (ref 4.22–5.81)
RDW: 13.6 % (ref 11.5–15.5)
WBC: 4.8 10*3/uL (ref 4.0–10.5)
nRBC: 0 % (ref 0.0–0.2)

## 2021-07-04 LAB — PROTIME-INR
INR: 1 (ref 0.8–1.2)
Prothrombin Time: 13.1 seconds (ref 11.4–15.2)

## 2021-07-04 SURGERY — REPAIR, HERNIA, INGUINAL, ADULT
Anesthesia: General

## 2021-07-04 MED ORDER — EPHEDRINE SULFATE-NACL 50-0.9 MG/10ML-% IV SOSY
PREFILLED_SYRINGE | INTRAVENOUS | Status: DC | PRN
  Administered 2021-07-04: 5 mg via INTRAVENOUS

## 2021-07-04 MED ORDER — SUGAMMADEX SODIUM 200 MG/2ML IV SOLN
INTRAVENOUS | Status: DC | PRN
  Administered 2021-07-04: 150 mg via INTRAVENOUS

## 2021-07-04 MED ORDER — CELECOXIB 200 MG PO CAPS: 200.0000 mg | ORAL_CAPSULE | Freq: Once | ORAL | Status: AC

## 2021-07-04 MED ORDER — AMISULPRIDE (ANTIEMETIC) 5 MG/2ML IV SOLN: 10.0000 mg | Freq: Once | INTRAVENOUS | Status: DC | PRN

## 2021-07-04 MED ORDER — CHLORHEXIDINE GLUCONATE 0.12 % MT SOLN
15.0000 mL | Freq: Once | OROMUCOSAL | Status: AC
  Administered 2021-07-04: 15 mL via OROMUCOSAL

## 2021-07-04 MED ORDER — SODIUM CHLORIDE 0.9% FLUSH: 3.0000 mL | Freq: Two times a day (BID) | INTRAVENOUS | Status: DC

## 2021-07-04 MED ORDER — CEFAZOLIN SODIUM-DEXTROSE 2-4 GM/100ML-% IV SOLN
INTRAVENOUS | Status: AC
  Filled 2021-07-04: qty 100

## 2021-07-04 MED ORDER — BUPIVACAINE-EPINEPHRINE (PF) 0.25% -1:200000 IJ SOLN
INTRAMUSCULAR | Status: AC
  Filled 2021-07-04: qty 30

## 2021-07-04 MED ORDER — PROMETHAZINE HCL 25 MG/ML IJ SOLN: 6.2500 mg | INTRAMUSCULAR | Status: DC | PRN

## 2021-07-04 MED ORDER — ROCURONIUM BROMIDE 10 MG/ML (PF) SYRINGE
PREFILLED_SYRINGE | INTRAVENOUS | Status: DC | PRN
  Administered 2021-07-04 (×2): 10 mg via INTRAVENOUS
  Administered 2021-07-04: 60 mg via INTRAVENOUS

## 2021-07-04 MED ORDER — ROCURONIUM BROMIDE 10 MG/ML (PF) SYRINGE
PREFILLED_SYRINGE | INTRAVENOUS | Status: AC
  Filled 2021-07-04: qty 10

## 2021-07-04 MED ORDER — PROPOFOL 10 MG/ML IV BOLUS
INTRAVENOUS | Status: AC
  Filled 2021-07-04: qty 20

## 2021-07-04 MED ORDER — BUPIVACAINE LIPOSOME 1.3 % IJ SUSP
INTRAMUSCULAR | Status: DC | PRN
  Administered 2021-07-04: 20 mL

## 2021-07-04 MED ORDER — PROPOFOL 10 MG/ML IV BOLUS
INTRAVENOUS | Status: DC | PRN
  Administered 2021-07-04: 150 mg via INTRAVENOUS

## 2021-07-04 MED ORDER — FENTANYL CITRATE PF 50 MCG/ML IJ SOSY
PREFILLED_SYRINGE | INTRAMUSCULAR | Status: AC
  Filled 2021-07-04: qty 2

## 2021-07-04 MED ORDER — DEXAMETHASONE SODIUM PHOSPHATE 10 MG/ML IJ SOLN
INTRAMUSCULAR | Status: DC | PRN
  Administered 2021-07-04: 5 mg via INTRAVENOUS

## 2021-07-04 MED ORDER — MIDAZOLAM HCL 2 MG/2ML IJ SOLN
INTRAMUSCULAR | Status: AC
  Filled 2021-07-04: qty 2

## 2021-07-04 MED ORDER — FENTANYL CITRATE PF 50 MCG/ML IJ SOSY
PREFILLED_SYRINGE | INTRAMUSCULAR | Status: AC
  Filled 2021-07-04: qty 1

## 2021-07-04 MED ORDER — LIDOCAINE 2% (20 MG/ML) 5 ML SYRINGE
INTRAMUSCULAR | Status: DC | PRN
  Administered 2021-07-04: 40 mg via INTRAVENOUS

## 2021-07-04 MED ORDER — CEFAZOLIN SODIUM-DEXTROSE 2-4 GM/100ML-% IV SOLN
2.0000 g | INTRAVENOUS | Status: AC
  Administered 2021-07-04: 2 g via INTRAVENOUS

## 2021-07-04 MED ORDER — BUPIVACAINE LIPOSOME 1.3 % IJ SUSP
INTRAMUSCULAR | Status: AC
  Filled 2021-07-04: qty 20

## 2021-07-04 MED ORDER — ONDANSETRON HCL 4 MG/2ML IJ SOLN
INTRAMUSCULAR | Status: DC | PRN
  Administered 2021-07-04: 4 mg via INTRAVENOUS

## 2021-07-04 MED ORDER — FENTANYL CITRATE PF 50 MCG/ML IJ SOSY
25.0000 ug | PREFILLED_SYRINGE | INTRAMUSCULAR | Status: DC | PRN
  Administered 2021-07-04 (×2): 50 ug via INTRAVENOUS

## 2021-07-04 MED ORDER — ONDANSETRON HCL 4 MG/2ML IJ SOLN
INTRAMUSCULAR | Status: AC
  Filled 2021-07-04: qty 2

## 2021-07-04 MED ORDER — CHLORHEXIDINE GLUCONATE 4 % EX LIQD: 60.0000 mL | Freq: Once | CUTANEOUS | Status: DC

## 2021-07-04 MED ORDER — ACETAMINOPHEN 325 MG PO TABS: 650.0000 mg | ORAL_TABLET | ORAL | Status: DC | PRN

## 2021-07-04 MED ORDER — ACETAMINOPHEN 650 MG RE SUPP: 650.0000 mg | RECTAL | Status: DC | PRN

## 2021-07-04 MED ORDER — DEXAMETHASONE SODIUM PHOSPHATE 10 MG/ML IJ SOLN
INTRAMUSCULAR | Status: AC
Start: 2021-07-04 — End: ?
  Filled 2021-07-04: qty 1

## 2021-07-04 MED ORDER — ORAL CARE MOUTH RINSE: 15.0000 mL | Freq: Once | OROMUCOSAL | Status: AC

## 2021-07-04 MED ORDER — SODIUM CHLORIDE 0.9% FLUSH: 3.0000 mL | INTRAVENOUS | Status: DC | PRN

## 2021-07-04 MED ORDER — LACTATED RINGERS IV SOLN: INTRAVENOUS | Status: DC

## 2021-07-04 MED ORDER — FENTANYL CITRATE (PF) 250 MCG/5ML IJ SOLN
INTRAMUSCULAR | Status: AC
  Filled 2021-07-04: qty 5

## 2021-07-04 MED ORDER — EPHEDRINE 5 MG/ML INJ
INTRAVENOUS | Status: AC
  Filled 2021-07-04: qty 5

## 2021-07-04 MED ORDER — BUPIVACAINE LIPOSOME 1.3 % IJ SUSP: 20.0000 mL | Freq: Once | INTRAMUSCULAR | Status: DC

## 2021-07-04 MED ORDER — FENTANYL CITRATE (PF) 100 MCG/2ML IJ SOLN
INTRAMUSCULAR | Status: AC
  Filled 2021-07-04: qty 2

## 2021-07-04 MED ORDER — FENTANYL CITRATE (PF) 250 MCG/5ML IJ SOLN
INTRAMUSCULAR | Status: DC | PRN
  Administered 2021-07-04: 50 ug via INTRAVENOUS
  Administered 2021-07-04: 100 ug via INTRAVENOUS
  Administered 2021-07-04 (×2): 50 ug via INTRAVENOUS

## 2021-07-04 MED ORDER — DOCUSATE SODIUM 100 MG PO CAPS: 100.0000 mg | ORAL_CAPSULE | Freq: Two times a day (BID) | ORAL | 0 refills | Status: AC

## 2021-07-04 MED ORDER — BUPIVACAINE-EPINEPHRINE 0.25% -1:200000 IJ SOLN
INTRAMUSCULAR | Status: DC | PRN
  Administered 2021-07-04: 30 mL

## 2021-07-04 MED ORDER — OXYCODONE HCL 5 MG PO TABS
5.0000 mg | ORAL_TABLET | ORAL | Status: DC | PRN
  Administered 2021-07-04: 5 mg via ORAL

## 2021-07-04 MED ORDER — OXYCODONE HCL 5 MG PO TABS
ORAL_TABLET | ORAL | Status: AC
  Filled 2021-07-04: qty 1

## 2021-07-04 MED ORDER — LIDOCAINE HCL (PF) 2 % IJ SOLN
INTRAMUSCULAR | Status: AC
  Filled 2021-07-04: qty 5

## 2021-07-04 MED ORDER — SODIUM CHLORIDE 0.9 % IV SOLN: 250.0000 mL | INTRAVENOUS | Status: DC | PRN

## 2021-07-04 MED ORDER — MIDAZOLAM HCL 2 MG/2ML IJ SOLN
INTRAMUSCULAR | Status: DC | PRN
  Administered 2021-07-04: 2 mg via INTRAVENOUS

## 2021-07-04 MED ORDER — FENTANYL CITRATE PF 50 MCG/ML IJ SOSY: 25.0000 ug | PREFILLED_SYRINGE | INTRAMUSCULAR | Status: DC | PRN

## 2021-07-04 MED ORDER — CELECOXIB 200 MG PO CAPS
ORAL_CAPSULE | ORAL | Status: AC
  Administered 2021-07-04: 200 mg via ORAL
  Filled 2021-07-04: qty 1

## 2021-07-04 MED ORDER — OXYCODONE HCL 5 MG PO TABS: 5.0000 mg | ORAL_TABLET | Freq: Three times a day (TID) | ORAL | 0 refills | Status: AC | PRN

## 2021-07-04 SURGICAL SUPPLY — 46 items
BAG COUNTER SPONGE SURGICOUNT (BAG) IMPLANT
BENZOIN TINCTURE PRP APPL 2/3 (GAUZE/BANDAGES/DRESSINGS) ×2 IMPLANT
BLADE SURG 15 STRL LF DISP TIS (BLADE) ×1 IMPLANT
BLADE SURG 15 STRL SS (BLADE) ×2
CHLORAPREP W/TINT 26 (MISCELLANEOUS) ×2 IMPLANT
CLSR STERI-STRIP ANTIMIC 1/2X4 (GAUZE/BANDAGES/DRESSINGS) ×1 IMPLANT
COVER SURGICAL LIGHT HANDLE (MISCELLANEOUS) ×2 IMPLANT
DRAIN PENROSE 0.5X18 (DRAIN) ×2 IMPLANT
DRAPE LAPAROSCOPIC ABDOMINAL (DRAPES) ×2 IMPLANT
ELECT PENCIL ROCKER SW 15FT (MISCELLANEOUS) ×1 IMPLANT
ELECT REM PT RETURN 15FT ADLT (MISCELLANEOUS) ×2 IMPLANT
GAUZE SPONGE 4X4 12PLY STRL (GAUZE/BANDAGES/DRESSINGS) ×1 IMPLANT
GLOVE BIO SURGEON STRL SZ 6 (GLOVE) ×2 IMPLANT
GLOVE INDICATOR 6.5 STRL GRN (GLOVE) ×2 IMPLANT
GLOVE SS BIOGEL STRL SZ 6 (GLOVE) ×1 IMPLANT
GLOVE SUPERSENSE BIOGEL SZ 6 (GLOVE) ×1
GOWN STRL REUS W/ TWL LRG LVL3 (GOWN DISPOSABLE) ×1 IMPLANT
GOWN STRL REUS W/ TWL XL LVL3 (GOWN DISPOSABLE) IMPLANT
GOWN STRL REUS W/TWL LRG LVL3 (GOWN DISPOSABLE) ×2
GOWN STRL REUS W/TWL XL LVL3 (GOWN DISPOSABLE)
KIT BASIN OR (CUSTOM PROCEDURE TRAY) ×2 IMPLANT
KIT TURNOVER KIT A (KITS) IMPLANT
MARKER SKIN DUAL TIP RULER LAB (MISCELLANEOUS) ×2 IMPLANT
MESH ULTRAPRO 3X6 7.6X15CM (Mesh General) ×1 IMPLANT
NEEDLE HYPO 22GX1.5 SAFETY (NEEDLE) ×2 IMPLANT
PACK BASIC VI WITH GOWN DISP (CUSTOM PROCEDURE TRAY) ×2 IMPLANT
PENCIL SMOKE EVACUATOR (MISCELLANEOUS) IMPLANT
SPIKE FLUID TRANSFER (MISCELLANEOUS) ×2 IMPLANT
SPONGE T-LAP 4X18 ~~LOC~~+RFID (SPONGE) ×2 IMPLANT
STRIP CLOSURE SKIN 1/2X4 (GAUZE/BANDAGES/DRESSINGS) ×2 IMPLANT
SUT ETHIBOND 0 MO6 C/R (SUTURE) ×2 IMPLANT
SUT MNCRL AB 4-0 PS2 18 (SUTURE) ×2 IMPLANT
SUT PDS AB 0 CT1 36 (SUTURE) ×5 IMPLANT
SUT SILK 3 0 (SUTURE) ×2
SUT SILK 3-0 18XBRD TIE 12 (SUTURE) ×1 IMPLANT
SUT VIC AB 3-0 SH 27 (SUTURE) ×4
SUT VIC AB 3-0 SH 27XBRD (SUTURE) ×2 IMPLANT
SUT VICRYL 0 UR6 27IN ABS (SUTURE) IMPLANT
SUT VICRYL 3 0 BR 18  UND (SUTURE) ×2
SUT VICRYL 3 0 BR 18 UND (SUTURE) ×1 IMPLANT
SUT VICRYL AB 3 0 TIES (SUTURE) ×1 IMPLANT
SYR CONTROL 10ML LL (SYRINGE) ×2 IMPLANT
TAPE CLOTH SURG 4X10 WHT LF (GAUZE/BANDAGES/DRESSINGS) ×1 IMPLANT
TOWEL OR 17X26 10 PK STRL BLUE (TOWEL DISPOSABLE) ×2 IMPLANT
TOWEL OR NON WOVEN STRL DISP B (DISPOSABLE) ×2 IMPLANT
TRAY FOLEY MTR SLVR 16FR STAT (SET/KITS/TRAYS/PACK) IMPLANT

## 2021-07-04 NOTE — Anesthesia Preprocedure Evaluation (Addendum)
Anesthesia Evaluation  Patient identified by MRN, date of birth, ID band Patient awake    Reviewed: Allergy & Precautions, NPO status , Patient's Chart, lab work & pertinent test results  History of Anesthesia Complications Negative for: history of anesthetic complications  Airway Mallampati: II  TM Distance: >3 FB Neck ROM: Full    Dental  (+) Dental Advisory Given, Poor Dentition   Pulmonary Current SmokerPatient did not abstain from smoking.,    Pulmonary exam normal        Cardiovascular negative cardio ROS Normal cardiovascular exam     Neuro/Psych Depression Bipolar Disorder negative neurological ROS     GI/Hepatic negative GI ROS, (+) Cirrhosis   ascites  substance abuse  alcohol use, Hepatitis -, C  Endo/Other  negative endocrine ROS  Renal/GU negative Renal ROS     Musculoskeletal negative musculoskeletal ROS (+)   Abdominal   Peds  Hematology negative hematology ROS (+)   Anesthesia Other Findings   Reproductive/Obstetrics                            Anesthesia Physical Anesthesia Plan  ASA: 3  Anesthesia Plan: General   Post-op Pain Management: Celebrex PO (pre-op)*   Induction:   PONV Risk Score and Plan: 3 and Ondansetron, Dexamethasone and Midazolam  Airway Management Planned: Oral ETT and LMA  Additional Equipment:   Intra-op Plan:   Post-operative Plan: Extubation in OR  Informed Consent: I have reviewed the patients History and Physical, chart, labs and discussed the procedure including the risks, benefits and alternatives for the proposed anesthesia with the patient or authorized representative who has indicated his/her understanding and acceptance.     Dental advisory given  Plan Discussed with: Anesthesiologist and CRNA  Anesthesia Plan Comments:        Anesthesia Quick Evaluation

## 2021-07-04 NOTE — Op Note (Signed)
Operative Note  Calistro Rauf  [id number]  [id number]  07/04/2021   Surgeon: Phylliss Blakes MD FACS   Procedure performed: open repair of large inguinoscrotal right inguinal hernia, partial excision of noncommunicating hydrocele   Preop diagnosis:  right inguinal hernia   Post-op diagnosis/intraop findings:  large indirect inguinoscrotal hernia, extensive fibrotic cicatrix of surround cremasteric fibers, apparent noncommunicating hydrocele   Specimens: none   EBL: 5cc   Complications: none   Description of procedure: After confirming informed consent the patient was taken to the operating room and placed supine on operating room table where general anesthesia was initiated, preoperative antibiotics were administered, SCDs applied, and a formal timeout was performed. The groin was clipped, prepped and draped in the usual sterile fashion. An oblique incision was made in the just above the inguinal ligament after infiltrating the tissues with local anesthetic (exparel mixed with 0.25% marcaine with epinephrine). Soft tissues were dissected using electrocautery until the external oblique aponeurosis was encountered. This was divided sharply to expand the external ring. A plane was bluntly developed between the spermatic cord and the external oblique. The ilioinguinal nerve was divided between hemostats and each end ligated with 3-0 vicryl ties. The spermatic cord and hernia sac complex was then bluntly dissected away from the pubic tubercle and encircled with a Penrose. Inspection of the inguinal anatomy revealed a large indirect inguinoscrotal hernia with extensive cicatrix of splayed and fibrotic cremasteric/internal oblique fibers . A tedious dissection ensued to isolate the hernia sac to the level of the internal ring, where it was reduced into the abdomen intact. The cord structures were skeletonized and the testicle actually delivered into the field during the dissection. There was a essentially  decompressed moderate noncommunicating hydrocele. With the indirect sac reduced the internal ring was narrowed with interrupted 0 PDS to a diameter just sufficient to accommodate the cord structures.  A 3 x 6 piece of ultra Pro mesh was brought onto the field and trimmed to approximate the field. This was sutured to the pubic tubercle fascia using 0 PDS. Interrupted 0 ethibonds were then used to suture the mesh to the inferior shelving edge and interrupted PDS was used to secure the mesh to the internal oblique superiorly. The tails of the mesh were wrapped around the spermatic cord, ensuring adequate room for the cord, and sutured to each other with 0 ethibond, and then directed laterally to lie flat beneath the aponeurosis. Two additional 0 ethibonds were used to superficially secure the mesh to the inguinal floor repair medially and laterally. Hemostasis was ensured within the wound. The Penrose was removed. The external oblique aponeurosis was reapproximated with a running 3-0 Vicryl to re-create a narrowed external ring. More local was infiltrated around the pubic tubercle and in the plane just below the external oblique. The Scarpa's was reapproximated with interrupted 3-0 Vicryls. The skin was closed with a running subcuticular 4-0 Monocryl. The remainder of the local was injected in the subcutaneous and subcuticular space. The field was then cleaned, benzoin and Steri-Strips and sterile bandage were applied. Both testicles were palpated in the scrotum at the end of the case. The patient was then awakened extubated and taken to PACU in stable condition.    All counts were correct at the completion of the case

## 2021-07-04 NOTE — Interval H&P Note (Signed)
History and Physical Interval Note:  07/04/2021 12:07 PM  Albert Salazar  has presented today for surgery, with the diagnosis of INGUINAL HERNIA.  The various methods of treatment have been discussed with the patient and family. After consideration of risks, benefits and other options for treatment, the patient has consented to  Procedure(s): OPEN RIGHT INGUINAL HERNIA REPAIR WITH MESH (N/A) as a surgical intervention.  The patient's history has been reviewed, patient examined, no change in status, stable for surgery.  I have reviewed the patient's chart and labs.  Questions were answered to the patient's satisfaction.     Ashan Cueva Lollie Sails

## 2021-07-04 NOTE — Transfer of Care (Signed)
Immediate Anesthesia Transfer of Care Note  Patient: Albert Salazar  Procedure(s) Performed: OPEN RIGHT INGUINAL HERNIA REPAIR WITH MESH  Patient Location: PACU  Anesthesia Type:General  Level of Consciousness: awake, sedated and responds to stimulation  Airway & Oxygen Therapy: Patient Spontanous Breathing and Patient connected to face mask oxygen  Post-op Assessment: Report given to RN, Post -op Vital signs reviewed and stable and BP 168/70   Pulse 91    Resp 16   Pulse ox 100%  Post vital signs: Reviewed and stable  Last Vitals:  Vitals Value Taken Time  BP    Temp    Pulse    Resp    SpO2      Last Pain:  Vitals:   07/04/21 0936  TempSrc:   PainSc: 0-No pain         Complications: No notable events documented.

## 2021-07-04 NOTE — Discharge Instructions (Signed)
HERNIA REPAIR: POST OP INSTRUCTIONS   EAT Gradually transition to a high fiber diet with a fiber supplement over the next few weeks after discharge.  Start with a pureed / full liquid diet (see below)  WALK Walk an hour a day (cumulative- not all at once).  Control your pain to do that.    CONTROL PAIN Control pain so that you can walk, sleep, tolerate sneezing/coughing, and go up/down stairs.  HAVE A BOWEL MOVEMENT DAILY Keep your bowels regular to avoid problems.  OK to try a laxative to override constipation.  OK to use an antidairrheal to slow down diarrhea.  Call if not better after 2 tries  CALL IF YOU HAVE PROBLEMS/CONCERNS Call if you are still struggling despite following these instructions. Call if you have concerns not answered by these instructions  ######################################################################    DIET: Follow a light bland diet & liquids the first 24 hours after arrival home, such as soup, liquids, starches, etc.  Be sure to drink plenty of fluids.  Quickly advance to a usual solid diet within a few days.  Avoid fast food or heavy meals as your are more likely to get nauseated or have irregular bowels.  A low-sugar, high-fiber diet for the rest of your life is ideal.   Take your usually prescribed home medications unless otherwise directed.  PAIN CONTROL: Pain is best controlled by a usual combination of three different methods TOGETHER: Ice/Heat Over the counter pain medication Prescription pain medication Most patients will experience some swelling and bruising around the hernia(s) such as the bellybutton, groins, or old incisions.  Ice packs or heating pads (30-60 minutes up to 6 times a day) will help. Use ice for the first few days to help decrease swelling and bruising, then switch to heat to help relax tight/sore spots and speed recovery.  Some people prefer to use ice alone, heat alone, alternating between ice & heat.  Experiment to what  works for you.  Swelling and bruising can take several weeks to resolve.   It is helpful to take an over-the-counter pain medication regularly for the first days: Naproxen (Aleve, etc)  Two 220mg tabs twice a day OR Ibuprofen (Advil, etc) Three 200mg tabs four times a day (every meal & bedtime) AND Acetaminophen (Tylenol, etc) 325-650mg four times a day (every meal & bedtime) A  prescription for pain medication should be given to you upon discharge.  Take your pain medication as prescribed, IF NEEDED.  If you are having problems/concerns with the prescription medicine (does not control pain, nausea, vomiting, rash, itching, etc), please call us [phone] to see if we need to switch you to a different pain medicine that will work better for you and/or control your side effect better. If you need a refill on your pain medication, please contact your pharmacy.  They will contact our office to request authorization. Prescriptions will not be filled after 5 pm or on week-ends.  Avoid getting constipated.  Between the surgery and the pain medications, it is common to experience some constipation.  Increasing fluid intake and taking a fiber supplement (such as Metamucil, Citrucel, FiberCon, MiraLax, etc) 1-2 times a day regularly will usually help prevent this problem from occurring.  A mild laxative (prune juice, Milk of Magnesia, MiraLax, etc) should be taken according to package directions if there are no bowel movements after 48 hours.    Wash / shower every day, starting 2 days after surgery.  You may shower over the   steri strips which are waterproof.  Do not soak or submerge incision.  No rubbing, scrubbing, lotions or ointments to incision.  Remove your outer bandage 2 days after surgery.  Steri-Strips will peel off after 1 to 2 weeks.  You may leave the incision open to air.  You may replace a dressing/Band-Aid to cover an incision for comfort if you wish.  Continue to shower over incision(s) after  the dressing is off.  ACTIVITIES as tolerated:   You may resume regular (light) daily activities beginning the next day--such as daily self-care, walking, climbing stairs--gradually increasing activities as tolerated.  Control your pain so that you can walk an hour a day.  If you can walk 30 minutes without difficulty, it is safe to try more intense activity such as jogging, treadmill, bicycling, low-impact aerobics, swimming, etc. Refrain from the most intensive and strenuous activity such as sit-ups, heavy lifting, contact sports, etc  Refrain from any heavy lifting or straining until 6 weeks after surgery.   DO NOT PUSH THROUGH PAIN.  Let pain be your guide: If it hurts to do something, don't do it.  Pain is your body warning you to avoid that activity for another week until the pain goes down. You may drive when you are no longer taking prescription pain medication, you can comfortably wear a seatbelt, and you can safely maneuver your car and apply brakes. You may have sexual intercourse when it is comfortable.   FOLLOW UP in our office Please call [facility] at [phone] to set up an appointment to see your surgeon in the office for a follow-up appointment approximately 2-3 weeks after your surgery. Make sure that you call for this appointment the day you arrive home to insure a convenient appointment time.  9.  If you have disability of FMLA / Family leave forms, please bring the forms to the office for processing.  (do not give to your surgeon).  WHEN TO CALL us [phone]: Poor pain control Reactions / problems with new medications (rash/itching, nausea, etc)  Fever over 101.5 F (38.5 C) Inability to urinate Nausea and/or vomiting Worsening swelling or bruising Continued bleeding from incision. Increased pain, redness, or drainage from the incision   The clinic staff is available to answer your questions during regular business hours (8:30am-5pm).  Please don't hesitate to call and  ask to speak to one of our nurses for clinical concerns.   If you have a medical emergency, go to the nearest emergency room or call 911.  A surgeon from [facility] is always on call at the hospitals in [facility], [place] [address], [address], [place], [place]  [address] ?  [address] [address], [place], [place]   [address] MAIN: [phone] ? TOLL FREE: [phone] ? FAX: [phone] [email or web address]

## 2021-07-04 NOTE — Anesthesia Procedure Notes (Signed)
Procedure Name: Intubation Date/Time: 07/04/2021 12:53 PM Performed by: Lollie Sails, CRNA Pre-anesthesia Checklist: Patient identified, Emergency Drugs available, Suction available, Patient being monitored and Timeout performed Patient Re-evaluated:Patient Re-evaluated prior to induction Oxygen Delivery Method: Circle system utilized Preoxygenation: Pre-oxygenation with 100% oxygen Induction Type: IV induction Ventilation: Mask ventilation without difficulty Laryngoscope Size: 4 and Mac Grade View: Grade I Tube type: Oral Tube size: 7.5 mm Number of attempts: 1 Airway Equipment and Method: Stylet Placement Confirmation: ETT inserted through vocal cords under direct vision, positive ETCO2 and breath sounds checked- equal and bilateral Secured at: 23 cm Tube secured with: Tape Dental Injury: Teeth and Oropharynx as per pre-operative assessment  Comments: DL by Wille Glaser, Paramedic Student from [facility].

## 2021-07-05 NOTE — Anesthesia Postprocedure Evaluation (Signed)
Anesthesia Post Note  Patient: Albert Salazar  Procedure(s) Performed: OPEN RIGHT INGUINAL HERNIA REPAIR WITH MESH     Patient location during evaluation: PACU Anesthesia Type: General Level of consciousness: awake and alert Pain management: pain level controlled Vital Signs Assessment: post-procedure vital signs reviewed and stable Respiratory status: spontaneous breathing, nonlabored ventilation, respiratory function stable and patient connected to nasal cannula oxygen Cardiovascular status: blood pressure returned to baseline and stable Postop Assessment: no apparent nausea or vomiting Anesthetic complications: no   No notable events documented.  Last Vitals:  Vitals:   07/04/21 1700 07/04/21 1800  BP: 133/76 137/86  Pulse: 96 88  Resp: 12 13  Temp:    SpO2: 93% 95%    Last Pain:  Vitals:   07/04/21 1800  TempSrc:   PainSc: 0-No pain                 Earl Lites P Anina Schnake

## 2021-07-09 DIAGNOSIS — I10 Essential (primary) hypertension: Secondary | ICD-10-CM | POA: Diagnosis not present

## 2021-07-10 ENCOUNTER — Encounter (HOSPITAL_COMMUNITY): Payer: Self-pay

## 2021-07-10 ENCOUNTER — Other Ambulatory Visit: Payer: Self-pay

## 2021-07-10 ENCOUNTER — Emergency Department (HOSPITAL_COMMUNITY)
Admission: EM | Admit: 2021-07-10 | Discharge: 2021-07-10 | Disposition: A | Discharge status: Home / Self Care | Payer: Medicaid Other | Attending: Emergency Medicine | Admitting: Emergency Medicine

## 2021-07-10 ENCOUNTER — Emergency Department (HOSPITAL_COMMUNITY): Payer: Medicaid Other

## 2021-07-10 DIAGNOSIS — R109 Unspecified abdominal pain: Secondary | ICD-10-CM | POA: Diagnosis not present

## 2021-07-10 DIAGNOSIS — S3992XA Unspecified injury of lower back, initial encounter: Secondary | ICD-10-CM | POA: Diagnosis present

## 2021-07-10 DIAGNOSIS — S301XXA Contusion of abdominal wall, initial encounter: Secondary | ICD-10-CM | POA: Insufficient documentation

## 2021-07-10 DIAGNOSIS — R1031 Right lower quadrant pain: Secondary | ICD-10-CM | POA: Diagnosis not present

## 2021-07-10 DIAGNOSIS — X58XXXA Exposure to other specified factors, initial encounter: Secondary | ICD-10-CM | POA: Insufficient documentation

## 2021-07-10 DIAGNOSIS — G8918 Other acute postprocedural pain: Secondary | ICD-10-CM

## 2021-07-10 LAB — BASIC METABOLIC PANEL
Anion gap: 8 (ref 5–15)
BUN: 16 mg/dL (ref 8–23)
CO2: 27 mmol/L (ref 22–32)
Calcium: 8.9 mg/dL (ref 8.9–10.3)
Chloride: 106 mmol/L (ref 98–111)
Creatinine, Ser: 0.75 mg/dL (ref 0.61–1.24)
GFR, Estimated: >60 mL/min (ref >60)
Glucose, Bld: 130 mg/dL — ABNORMAL HIGH (ref 70–99)
Potassium: 3.5 mmol/L (ref 3.5–5.1)
Sodium: 141 mmol/L (ref 135–145)

## 2021-07-10 LAB — CBC WITH DIFFERENTIAL/PLATELET
Abs Immature Granulocytes: 0.03 10*3/uL (ref 0.00–0.07)
Basophils Absolute: 0 10*3/uL (ref 0.0–0.1)
Basophils Relative: 1 %
Eosinophils Absolute: 0 10*3/uL (ref 0.0–0.5)
Eosinophils Relative: 1 %
HCT: 37.9 % — ABNORMAL LOW (ref 39.0–52.0)
Hemoglobin: 12.1 g/dL — ABNORMAL LOW (ref 13.0–17.0)
Immature Granulocytes: 1 %
Lymphocytes Relative: 42 %
Lymphs Abs: 2.2 10*3/uL (ref 0.7–4.0)
MCH: 30.7 pg (ref 26.0–34.0)
MCHC: 31.9 g/dL (ref 30.0–36.0)
MCV: 96.2 fL (ref 80.0–100.0)
Monocytes Absolute: 0.8 10*3/uL (ref 0.1–1.0)
Monocytes Relative: 16 %
Neutro Abs: 1.9 10*3/uL (ref 1.7–7.7)
Neutrophils Relative %: 39 %
Platelets: 112 10*3/uL — ABNORMAL LOW (ref 150–400)
RBC: 3.94 MIL/uL — ABNORMAL LOW (ref 4.22–5.81)
RDW: 13.2 % (ref 11.5–15.5)
WBC: 5 10*3/uL (ref 4.0–10.5)
nRBC: 0 % (ref 0.0–0.2)

## 2021-07-10 MED ORDER — KETOROLAC TROMETHAMINE 30 MG/ML IJ SOLN
15.0000 mg | Freq: Once | INTRAMUSCULAR | Status: AC
Start: 2021-07-10 — End: 2021-07-10
  Administered 2021-07-10: 15 mg via INTRAVENOUS
  Filled 2021-07-10: qty 1

## 2021-07-10 MED ORDER — TRAMADOL HCL 50 MG PO TABS: 50.0000 mg | ORAL_TABLET | Freq: Four times a day (QID) | ORAL | 0 refills | Status: DC | PRN

## 2021-07-10 MED ORDER — HYDROMORPHONE HCL 1 MG/ML IJ SOLN
0.5000 mg | Freq: Once | INTRAMUSCULAR | Status: AC
  Administered 2021-07-10: 0.5 mg via INTRAVENOUS
  Filled 2021-07-10: qty 1

## 2021-07-10 NOTE — ED Triage Notes (Signed)
Pt BIBEMS, pt had surgery on groin hernia on 06/6. Pt states he has had increased pain, was prescribed oxycodone for pain but states he does not want to take it because he is not a drug addict. Pt presents to ED for pain control.

## 2021-07-10 NOTE — ED Provider Notes (Signed)
[facility] DEPT Provider Note   CSN: [id number] Arrival date & time: 07/10/21  0011     History  Chief Complaint  Patient presents with   Groin Pain    Albert Salazar is a 66 y.o. male.  66 year old male who presents with pain to his right groin and scrotum.  Patient had hernia surgery 6 days ago.  Had been prescribed oxycodone but did not take it due to concern for side effects.  States that he has been using ice which does improve the symptoms.  Notes that the swelling has been persistent.  Denies any new urinary symptoms.  Pain is characterized as sharp and worse with any movement.  Called EMS and was transported here       Home Medications Prior to Admission medications   Medication Sig Start Date End Date Taking? Authorizing Provider  docusate sodium (COLACE) 100 MG capsule Take 1 capsule (100 mg total) by mouth 2 (two) times daily. Okay to decrease to once daily or stop taking if having loose bowel movements 07/04/21 08/03/21  Clovis Riley, MD  oxyCODONE (ROXICODONE) 5 MG immediate release tablet Take 1 tablet (5 mg total) by mouth every 8 (eight) hours as needed. Alternate tylenol and ibuprofen for the first few days. Take narcotic pain medication only if needed for severe/ breakthrough pain. 07/04/21 07/04/22  Clovis Riley, MD  tamsulosin (FLOMAX) 0.4 MG CAPS capsule Take 1 capsule (0.4 mg total) by mouth daily. 06/20/21   Quintella Reichert, MD      Allergies    Patient has no known allergies.    Review of Systems   Review of Systems  All other systems reviewed and are negative.   Physical Exam Updated Vital Signs BP (!) 179/95   Pulse 95   Temp 98 F (36.7 C)   Resp 18   Ht 1.753 m (5\' 9" )   Wt 63.5 kg   SpO2 98%   BMI 20.67 kg/m  Physical Exam Vitals and nursing note reviewed. Exam conducted with a chaperone present.  Constitutional:      General: He is not in acute distress.    Appearance: Normal appearance. He is  well-developed. He is not toxic-appearing.  HENT:     Head: Normocephalic and atraumatic.  Eyes:     General: Lids are normal.     Conjunctiva/sclera: Conjunctivae normal.     Pupils: Pupils are equal, round, and reactive to light.  Neck:     Thyroid: No thyroid mass.     Trachea: No tracheal deviation.  Cardiovascular:     Rate and Rhythm: Normal rate and regular rhythm.     Heart sounds: Normal heart sounds. No murmur heard.    No gallop.  Pulmonary:     Effort: Pulmonary effort is normal. No respiratory distress.     Breath sounds: Normal breath sounds. No stridor. No decreased breath sounds, wheezing, rhonchi or rales.  Abdominal:     General: There is no distension.     Palpations: Abdomen is soft.     Tenderness: There is no abdominal tenderness. There is no rebound.  Genitourinary:    Comments: Ecchymosis noted in patient's right groin.  Incision intact.  No active drainage.  Ecchymosis noted to right side of patient's scrotum.  Edema appreciated as well 2.  Some ecchymosis going up patient's right flank Musculoskeletal:        General: No tenderness. Normal range of motion.     Cervical back: Normal  range of motion and neck supple.  Skin:    General: Skin is warm and dry.     Findings: No abrasion or rash.  Neurological:     Mental Status: He is alert and oriented to person, place, and time. Mental status is at baseline.     GCS: GCS eye subscore is 4. GCS verbal subscore is 5. GCS motor subscore is 6.     Cranial Nerves: No cranial nerve deficit.     Sensory: No sensory deficit.     Motor: Motor function is intact.  Psychiatric:        Attention and Perception: Attention normal.        Speech: Speech normal.        Behavior: Behavior normal.     ED Results / Procedures / Treatments   Labs (all labs ordered are listed, but only abnormal results are displayed) Labs Reviewed  CBC WITH DIFFERENTIAL/PLATELET  BASIC METABOLIC PANEL  URINALYSIS, ROUTINE W REFLEX  MICROSCOPIC    EKG None  Radiology No results found.  Procedures Procedures    Medications Ordered in ED Medications  HYDROmorphone (DILAUDID) injection 0.5 mg (has no administration in time range)    ED Course/ Medical Decision Making/ A&P                           Medical Decision Making Amount and/or Complexity of Data Reviewed Labs: ordered. Radiology: ordered.  Risk Prescription drug management.   Patient here for postoperative pain.  Has not been taking his medications as directed for pain.  Abdominal CT performed and per my interpretation showed no evidence of infection.  He has no leukocytosis on CBC.  Low suspicion for infection.  Case discussed with on-call general surgeon, Dr. Georgette Dover, who reviewed patient's films and felt that the findings were consistent with postoperative changes.  I agree with this.  Patient given IV pain medication.  I will prescribe patient Ultram.  He has general surgery follow-up scheduled in the near future        Final Clinical Impression(s) / ED Diagnoses Final diagnoses:  None    Rx / DC Orders ED Discharge Orders     None         Lacretia Leigh, MD 07/10/21 1041

## 2021-07-11 DIAGNOSIS — K469 Unspecified abdominal hernia without obstruction or gangrene: Secondary | ICD-10-CM | POA: Diagnosis not present

## 2021-07-11 DIAGNOSIS — N400 Enlarged prostate without lower urinary tract symptoms: Secondary | ICD-10-CM | POA: Diagnosis not present

## 2021-07-11 DIAGNOSIS — I10 Essential (primary) hypertension: Secondary | ICD-10-CM | POA: Diagnosis not present

## 2021-07-11 DIAGNOSIS — L258 Unspecified contact dermatitis due to other agents: Secondary | ICD-10-CM | POA: Diagnosis not present

## 2021-07-12 DIAGNOSIS — I10 Essential (primary) hypertension: Secondary | ICD-10-CM | POA: Diagnosis not present

## 2021-07-12 DIAGNOSIS — R5383 Other fatigue: Secondary | ICD-10-CM | POA: Diagnosis not present

## 2021-07-21 ENCOUNTER — Other Ambulatory Visit: Payer: Self-pay

## 2021-07-21 ENCOUNTER — Emergency Department (HOSPITAL_COMMUNITY)
Admission: EM | Admit: 2021-07-21 | Discharge: 2021-07-22 | Disposition: A | Discharge status: Home / Self Care | Payer: Medicaid Other | Attending: Emergency Medicine | Admitting: Emergency Medicine

## 2021-07-21 ENCOUNTER — Encounter (HOSPITAL_COMMUNITY): Payer: Self-pay | Admitting: Emergency Medicine

## 2021-07-21 DIAGNOSIS — Y906 Blood alcohol level of 120-199 mg/100 ml: Secondary | ICD-10-CM | POA: Diagnosis not present

## 2021-07-21 DIAGNOSIS — F10920 Alcohol use, unspecified with intoxication, uncomplicated: Secondary | ICD-10-CM

## 2021-07-21 DIAGNOSIS — I160 Hypertensive urgency: Secondary | ICD-10-CM | POA: Diagnosis not present

## 2021-07-21 DIAGNOSIS — F1420 Cocaine dependence, uncomplicated: Secondary | ICD-10-CM | POA: Diagnosis present

## 2021-07-21 DIAGNOSIS — R9431 Abnormal electrocardiogram [ECG] [EKG]: Secondary | ICD-10-CM | POA: Diagnosis not present

## 2021-07-21 DIAGNOSIS — F1024 Alcohol dependence with alcohol-induced mood disorder: Secondary | ICD-10-CM | POA: Diagnosis not present

## 2021-07-21 DIAGNOSIS — F10929 Alcohol use, unspecified with intoxication, unspecified: Secondary | ICD-10-CM | POA: Diagnosis not present

## 2021-07-21 DIAGNOSIS — R45851 Suicidal ideations: Secondary | ICD-10-CM | POA: Diagnosis not present

## 2021-07-21 DIAGNOSIS — R45850 Homicidal ideations: Secondary | ICD-10-CM | POA: Diagnosis not present

## 2021-07-21 DIAGNOSIS — F1994 Other psychoactive substance use, unspecified with psychoactive substance-induced mood disorder: Secondary | ICD-10-CM | POA: Diagnosis present

## 2021-07-21 DIAGNOSIS — F1424 Cocaine dependence with cocaine-induced mood disorder: Secondary | ICD-10-CM | POA: Insufficient documentation

## 2021-07-21 DIAGNOSIS — F1020 Alcohol dependence, uncomplicated: Secondary | ICD-10-CM | POA: Diagnosis present

## 2021-07-21 LAB — CBC WITH DIFFERENTIAL/PLATELET
Abs Immature Granulocytes: 0.03 10*3/uL (ref 0.00–0.07)
Basophils Absolute: 0 10*3/uL (ref 0.0–0.1)
Basophils Relative: 1 %
Eosinophils Absolute: 0 10*3/uL (ref 0.0–0.5)
Eosinophils Relative: 1 %
HCT: 42.9 % (ref 39.0–52.0)
Hemoglobin: 13.8 g/dL (ref 13.0–17.0)
Immature Granulocytes: 0 %
Lymphocytes Relative: 50 %
Lymphs Abs: 3.7 10*3/uL (ref 0.7–4.0)
MCH: 30.9 pg (ref 26.0–34.0)
MCHC: 32.2 g/dL (ref 30.0–36.0)
MCV: 96 fL (ref 80.0–100.0)
Monocytes Absolute: 0.7 10*3/uL (ref 0.1–1.0)
Monocytes Relative: 10 %
Neutro Abs: 2.7 10*3/uL (ref 1.7–7.7)
Neutrophils Relative %: 38 %
Platelets: 179 10*3/uL (ref 150–400)
RBC: 4.47 MIL/uL (ref 4.22–5.81)
RDW: 13.7 % (ref 11.5–15.5)
WBC: 7.2 10*3/uL (ref 4.0–10.5)
nRBC: 0 % (ref 0.0–0.2)

## 2021-07-21 LAB — COMPREHENSIVE METABOLIC PANEL
ALT: 32 U/L (ref 0–44)
AST: 43 U/L — ABNORMAL HIGH (ref 15–41)
Albumin: 3.1 g/dL — ABNORMAL LOW (ref 3.5–5.0)
Alkaline Phosphatase: 94 U/L (ref 38–126)
Anion gap: 9 (ref 5–15)
BUN: 7 mg/dL — ABNORMAL LOW (ref 8–23)
CO2: 28 mmol/L (ref 22–32)
Calcium: 8.7 mg/dL — ABNORMAL LOW (ref 8.9–10.3)
Chloride: 99 mmol/L (ref 98–111)
Creatinine, Ser: 0.87 mg/dL (ref 0.61–1.24)
GFR, Estimated: >60 mL/min (ref >60)
Glucose, Bld: 111 mg/dL — ABNORMAL HIGH (ref 70–99)
Potassium: 3.5 mmol/L (ref 3.5–5.1)
Sodium: 136 mmol/L (ref 135–145)
Total Bilirubin: 0.7 mg/dL (ref 0.3–1.2)
Total Protein: 7.9 g/dL (ref 6.5–8.1)

## 2021-07-21 LAB — RAPID URINE DRUG SCREEN, HOSP PERFORMED
Amphetamines: NOT DETECTED
Barbiturates: NOT DETECTED
Benzodiazepines: NOT DETECTED
Cocaine: POSITIVE — AB
Opiates: NOT DETECTED
Tetrahydrocannabinol: NOT DETECTED

## 2021-07-21 LAB — ACETAMINOPHEN LEVEL: Acetaminophen (Tylenol), Serum: <10 ug/mL — ABNORMAL LOW (ref 10–30)

## 2021-07-21 LAB — SALICYLATE LEVEL: Salicylate Lvl: <7 mg/dL — ABNORMAL LOW (ref 7.0–30.0)

## 2021-07-21 LAB — ETHANOL: Alcohol, Ethyl (B): 193 mg/dL — ABNORMAL HIGH (ref <10)

## 2021-07-21 MED ORDER — LORAZEPAM 1 MG PO TABS: 0.0000 mg | ORAL_TABLET | Freq: Two times a day (BID) | ORAL | Status: DC

## 2021-07-21 MED ORDER — LORAZEPAM 1 MG PO TABS: 0.0000 mg | ORAL_TABLET | Freq: Four times a day (QID) | ORAL | Status: DC

## 2021-07-21 MED ORDER — LORAZEPAM 2 MG/ML IJ SOLN: 0.0000 mg | Freq: Four times a day (QID) | INTRAMUSCULAR | Status: DC

## 2021-07-21 MED ORDER — LORAZEPAM 2 MG/ML IJ SOLN: 0.0000 mg | Freq: Two times a day (BID) | INTRAMUSCULAR | Status: DC

## 2021-07-21 MED ORDER — THIAMINE HCL 100 MG PO TABS
100.0000 mg | ORAL_TABLET | Freq: Every day | ORAL | Status: DC
Start: 2021-07-21 — End: 2021-07-22
  Administered 2021-07-21 – 2021-07-22 (×2): 100 mg via ORAL
  Filled 2021-07-21 (×2): qty 1

## 2021-07-21 MED ORDER — THIAMINE HCL 100 MG/ML IJ SOLN: 100.0000 mg | Freq: Every day | INTRAMUSCULAR | Status: DC

## 2021-07-22 DIAGNOSIS — R45851 Suicidal ideations: Secondary | ICD-10-CM

## 2021-07-22 DIAGNOSIS — I160 Hypertensive urgency: Secondary | ICD-10-CM | POA: Diagnosis not present

## 2021-07-22 DIAGNOSIS — R45850 Homicidal ideations: Secondary | ICD-10-CM | POA: Diagnosis not present

## 2021-07-22 DIAGNOSIS — F1420 Cocaine dependence, uncomplicated: Secondary | ICD-10-CM | POA: Diagnosis present

## 2021-07-22 DIAGNOSIS — F10929 Alcohol use, unspecified with intoxication, unspecified: Secondary | ICD-10-CM | POA: Diagnosis not present

## 2021-07-22 MED ORDER — AMLODIPINE BESYLATE 5 MG PO TABS
5.0000 mg | ORAL_TABLET | Freq: Every day | ORAL | Status: DC
  Administered 2021-07-22: 5 mg via ORAL
  Filled 2021-07-22: qty 1

## 2021-07-22 MED ORDER — LORAZEPAM 1 MG PO TABS
2.0000 mg | ORAL_TABLET | Freq: Once | ORAL | Status: AC
  Administered 2021-07-22: 2 mg via ORAL
  Filled 2021-07-22: qty 2

## 2021-07-22 MED ORDER — TAMSULOSIN HCL 0.4 MG PO CAPS
0.4000 mg | ORAL_CAPSULE | Freq: Every day | ORAL | Status: DC
  Administered 2021-07-22: 0.4 mg via ORAL
  Filled 2021-07-22: qty 1

## 2021-07-22 MED ORDER — IBUPROFEN 400 MG PO TABS
400.0000 mg | ORAL_TABLET | Freq: Four times a day (QID) | ORAL | Status: DC | PRN
Start: 2021-07-22 — End: 2021-07-22

## 2021-07-22 MED ORDER — AMLODIPINE BESYLATE 5 MG PO TABS: 5.0000 mg | ORAL_TABLET | Freq: Every day | ORAL | 1 refills | Status: DC

## 2021-07-22 MED ORDER — TRAMADOL HCL 50 MG PO TABS: 50.0000 mg | ORAL_TABLET | Freq: Four times a day (QID) | ORAL | Status: DC | PRN

## 2021-07-22 MED ORDER — DOCUSATE SODIUM 100 MG PO CAPS
100.0000 mg | ORAL_CAPSULE | Freq: Two times a day (BID) | ORAL | Status: DC
  Administered 2021-07-22: 100 mg via ORAL
  Filled 2021-07-22: qty 1

## 2021-07-22 NOTE — ED Provider Notes (Signed)
Patient has been cleared by psychiatry.  He will be given outpatient resources.  From a blood pressure perspective, we will start him on Norvasc for what seems to be a chronic hypertension.  Follow-up with PCP.  He states he feels well enough for discharge.   Pricilla Loveless, MD 07/22/21 1248

## 2021-07-23 DIAGNOSIS — F329 Major depressive disorder, single episode, unspecified: Secondary | ICD-10-CM | POA: Diagnosis not present

## 2021-07-23 DIAGNOSIS — R45850 Homicidal ideations: Secondary | ICD-10-CM | POA: Diagnosis not present

## 2021-07-23 DIAGNOSIS — R45851 Suicidal ideations: Secondary | ICD-10-CM | POA: Diagnosis not present

## 2021-07-23 DIAGNOSIS — F1420 Cocaine dependence, uncomplicated: Secondary | ICD-10-CM | POA: Diagnosis not present

## 2021-07-24 DIAGNOSIS — B1920 Unspecified viral hepatitis C without hepatic coma: Secondary | ICD-10-CM | POA: Diagnosis not present

## 2021-07-24 DIAGNOSIS — I10 Essential (primary) hypertension: Secondary | ICD-10-CM | POA: Diagnosis not present

## 2021-07-24 DIAGNOSIS — R109 Unspecified abdominal pain: Secondary | ICD-10-CM | POA: Diagnosis not present

## 2021-07-24 DIAGNOSIS — Z5900 Homelessness unspecified: Secondary | ICD-10-CM | POA: Diagnosis not present

## 2021-07-24 DIAGNOSIS — F32A Depression, unspecified: Secondary | ICD-10-CM | POA: Diagnosis not present

## 2021-07-24 DIAGNOSIS — F1420 Cocaine dependence, uncomplicated: Secondary | ICD-10-CM | POA: Diagnosis not present

## 2021-07-24 DIAGNOSIS — K7460 Unspecified cirrhosis of liver: Secondary | ICD-10-CM | POA: Diagnosis not present

## 2021-07-24 DIAGNOSIS — F1010 Alcohol abuse, uncomplicated: Secondary | ICD-10-CM | POA: Diagnosis not present

## 2021-07-24 DIAGNOSIS — K5909 Other constipation: Secondary | ICD-10-CM | POA: Diagnosis not present

## 2021-07-24 DIAGNOSIS — R45851 Suicidal ideations: Secondary | ICD-10-CM | POA: Diagnosis not present

## 2021-07-24 DIAGNOSIS — E119 Type 2 diabetes mellitus without complications: Secondary | ICD-10-CM | POA: Diagnosis not present

## 2021-07-24 DIAGNOSIS — F332 Major depressive disorder, recurrent severe without psychotic features: Secondary | ICD-10-CM | POA: Diagnosis not present

## 2021-07-24 DIAGNOSIS — R45850 Homicidal ideations: Secondary | ICD-10-CM | POA: Diagnosis not present

## 2021-07-24 DIAGNOSIS — H5462 Unqualified visual loss, left eye, normal vision right eye: Secondary | ICD-10-CM | POA: Diagnosis not present

## 2021-07-24 NOTE — Consults (Signed)
 ------------------------------------------------------------------------------- Attestation signed by Giambarberi, Luciana J, MD at 07/24/2021 12:36 PM I have personally participated in the formulation and care plan via discussion with the resident. I am in agreement with the house officer's documentation based upon our discussion. I made note of the patient's current level of functioning with particular emphasis on evaluation of risk factors for worsening mental status.  I am in agreement with the safety & care plan.  Electronically signed by: Charlyne Plumb, MD 07/24/2021 12:36 PM  -------------------------------------------------------------------------------   Psychiatry Consult Re-Evaluation   Date of Service: 07/24/2021   Subjective/Interval History   I got angry and upset on stepson because of chaos and drama. He said that his stepson smokes pot.  He doesn't use pot, but smokes cocaine 1-2 times per week. He also drink beer (1-2 can) daily. Her sister called for help when he was talking about his problem and probably endorse SI to her sister. Denies history of suicide attempt, but he said that SI occurs only when he gets angry. He doesn't want to hang around people who uses drug. He doesn't have place to live and wants stability.  No SI, HI, AVH. Been to rehab and was helpful in the past. Feels sad and irritable. Prefers to go to inpatient hospital.   Objective   Vitals:  Vitals:   07/23/21 0200 07/23/21 0800 07/23/21 1950 07/24/21 0428  BP:  150/88 153/93 119/76  Pulse:  100 116 105  Temp:  98.4 F (36.9 C)    Resp: 19 18 18 16   SpO2:  98% 98% 99%  PainSc:        Labs:  No results found for this or any previous visit (from the past 24 hour(s)).  Medical Review Of Systems: ROS: The remainder of a full review of systems was conducted and was not notable except as previously documented in HPI.   Mental Status Examination: General Appearance normal body habitus, appears  stated age and hygiene appropriate  General Behavior cooperative, pleasant and appropriate eye contact  Psychomotor Activity normoactive  Gait and Station not assessed  Speech   normal rate, fluent, normal volume, normal tone, normal prosody and normal amount  Mood   fine  Affect    depressed and irritable  Thought Process linear/organized and goal directed  Associations Intact  Thought Content/Perceptual Disturbances Denies suicidal/homicidal ideation and auditory/visual hallucinations.  Cognition/Sensorium  orientation intact (AAOx4), memory intact, attention intact, language normal and fund of knowledge intact  Insight  limited  Judgment limited    Assessment   Psychiatric Diagnoses: Cocaine Use Disorder, Severe (F14.20) (Primary Diagnosis) Unspecified Depressive Disorder (F32.9)   Psychosocial and contextual factors: Risk Factors: lack of treatment-seeking behavior, poor reality testing, housing problems, problems with primary support group and problems related to social environment Protective Factors: spiritual/religious beliefs  Albert Salazar is a 66 y.o. male with a psychiatric history significant for cocaine use disorder and unspecified depression, who presented to the ED for SI/HI.  On assessment today, pt was calm and cooperative. However, he became irritable while talking about his struggle with stepson. No overnight acute event reported by nursing. Pt feels that his mood has deteriorated due to chronic stressors and external factors such as death of his girlfriend and dealing with his stepson. Even though pt is currently denying suicidal ideations, but based on the history and collateral, it appears that his depression and suicidal ideations are worsening and episodes are worsening. Given his successful response to rehab in the past, he would be  benefited with dual diagnosis inpatient psychiatric hospitalization for further management and stabilization. Continue IVC and  transfer to dual diagnosis inpatient psych unit. No medication changes. Order UA, UDS, Repeat Potassium, TSH, Vitamin B12 & D Levels.  Recommendations   Initiation/continuation of involuntary commitment. Inpatient psychiatric hospitalization at appropriate facility - Preferably at Dual [facility].  Psychiatric medication recommendations Recommend starting Risperdal  1 mg qhs Medications: None Labs: UA, UDS, Repeat Potassium, TSH, Vitamin B12 & D Levels  Initiation of following precautions: Suicide and Elopement    Case discussed with Dr. Simpson Mac Fairly, MD 07/24/2021  [facility] Department of Psychiatry & Behavioral Health   Electronically signed by: Mac Fairly, MD Resident 07/24/21 1027    Electronically signed by: Charlyne JINNY Simpson, MD 07/24/21 1236

## 2021-07-25 DIAGNOSIS — F332 Major depressive disorder, recurrent severe without psychotic features: Secondary | ICD-10-CM | POA: Diagnosis not present

## 2021-07-25 DIAGNOSIS — F1420 Cocaine dependence, uncomplicated: Secondary | ICD-10-CM | POA: Diagnosis not present

## 2021-07-25 DIAGNOSIS — R45851 Suicidal ideations: Secondary | ICD-10-CM | POA: Diagnosis not present

## 2021-07-25 DIAGNOSIS — R45850 Homicidal ideations: Secondary | ICD-10-CM | POA: Diagnosis not present

## 2021-07-26 DIAGNOSIS — F332 Major depressive disorder, recurrent severe without psychotic features: Secondary | ICD-10-CM | POA: Diagnosis not present

## 2021-07-26 DIAGNOSIS — F1420 Cocaine dependence, uncomplicated: Secondary | ICD-10-CM | POA: Diagnosis not present

## 2021-07-27 DIAGNOSIS — F1420 Cocaine dependence, uncomplicated: Secondary | ICD-10-CM | POA: Diagnosis not present

## 2021-07-27 DIAGNOSIS — F332 Major depressive disorder, recurrent severe without psychotic features: Secondary | ICD-10-CM | POA: Diagnosis not present

## 2021-08-08 DIAGNOSIS — I10 Essential (primary) hypertension: Secondary | ICD-10-CM | POA: Diagnosis not present

## 2021-08-08 DIAGNOSIS — Z79899 Other long term (current) drug therapy: Secondary | ICD-10-CM | POA: Diagnosis not present

## 2021-08-08 DIAGNOSIS — R102 Pelvic and perineal pain: Secondary | ICD-10-CM | POA: Diagnosis not present

## 2021-08-10 DIAGNOSIS — Z20822 Contact with and (suspected) exposure to covid-19: Secondary | ICD-10-CM | POA: Diagnosis not present

## 2021-08-11 DIAGNOSIS — I10 Essential (primary) hypertension: Secondary | ICD-10-CM | POA: Diagnosis not present

## 2021-08-11 DIAGNOSIS — D509 Iron deficiency anemia, unspecified: Secondary | ICD-10-CM | POA: Diagnosis not present

## 2021-09-06 LAB — GLUCOSE, POCT (MANUAL RESULT ENTRY): POC Glucose: 129 mg/dl — AB (ref 70–99)

## 2021-09-14 ENCOUNTER — Other Ambulatory Visit (HOSPITAL_COMMUNITY): Payer: Self-pay

## 2021-10-19 DIAGNOSIS — I10 Essential (primary) hypertension: Secondary | ICD-10-CM | POA: Diagnosis not present

## 2021-10-19 DIAGNOSIS — N401 Enlarged prostate with lower urinary tract symptoms: Secondary | ICD-10-CM | POA: Diagnosis not present

## 2021-10-19 DIAGNOSIS — N50811 Right testicular pain: Secondary | ICD-10-CM | POA: Diagnosis not present

## 2021-10-19 DIAGNOSIS — K469 Unspecified abdominal hernia without obstruction or gangrene: Secondary | ICD-10-CM | POA: Diagnosis not present

## 2021-10-19 DIAGNOSIS — I159 Secondary hypertension, unspecified: Secondary | ICD-10-CM | POA: Diagnosis not present

## 2021-10-20 DIAGNOSIS — I10 Essential (primary) hypertension: Secondary | ICD-10-CM | POA: Diagnosis not present

## 2022-02-05 ENCOUNTER — Ambulatory Visit (HOSPITAL_COMMUNITY)

## 2022-08-19 IMAGING — CT CT ABD-PELV W/O CM
2 of 4 series · 15 of 46 positions shown, 17 images · non-contrast
Comparison: None Available.

CLINICAL DATA: Abdominal pain postop. Postop RIGHT inguinal hernia
repair.



[Series 2: axial st · axial · 0.95mm/px · z∈[+862,+1366]mm · 12 of 115 slices shown, 14 images]
[im 7/115  soft-tissue]
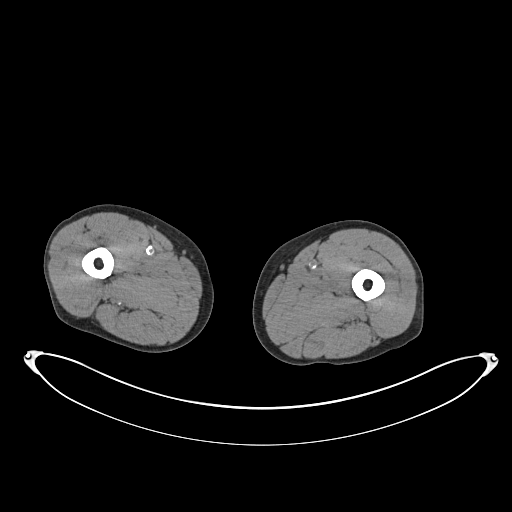
[im 7/115  bone]
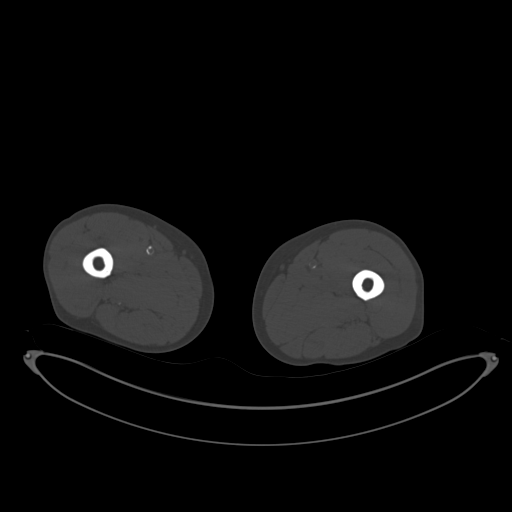
[im 20/115  soft-tissue]
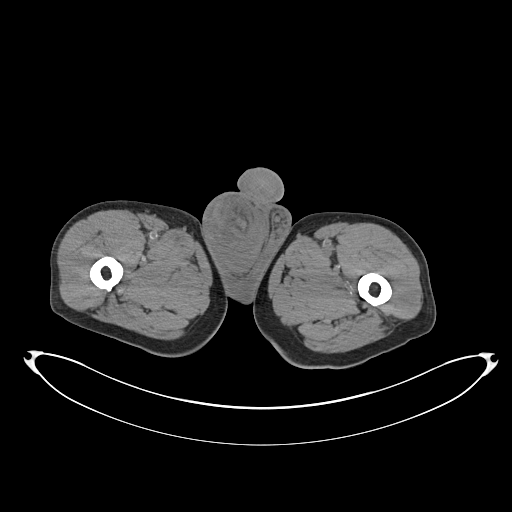
[im 26/115  soft-tissue]
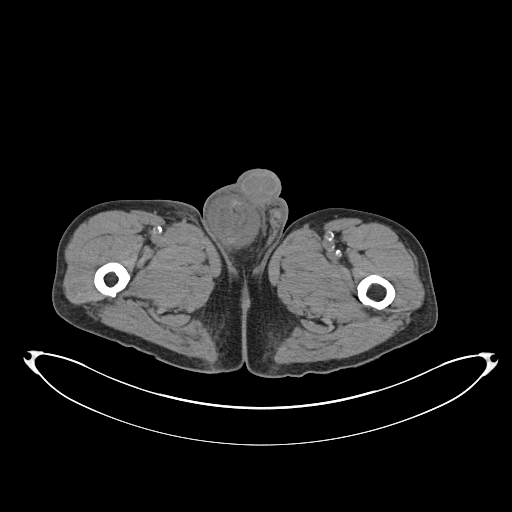
[im 32/115  soft-tissue]
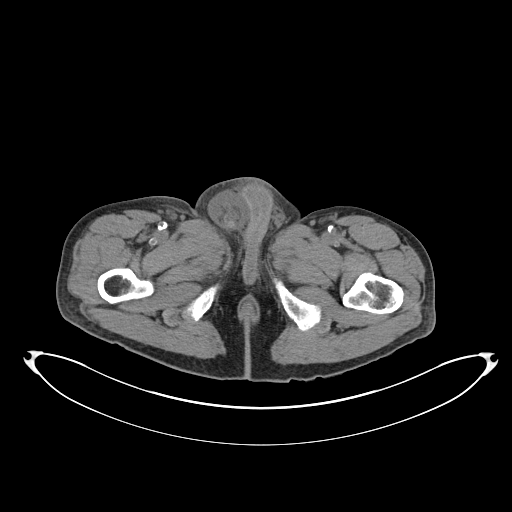
[im 45/115  soft-tissue]
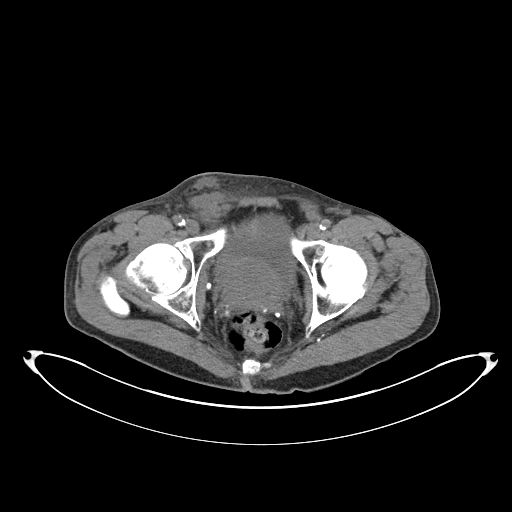
[im 51/115  soft-tissue]
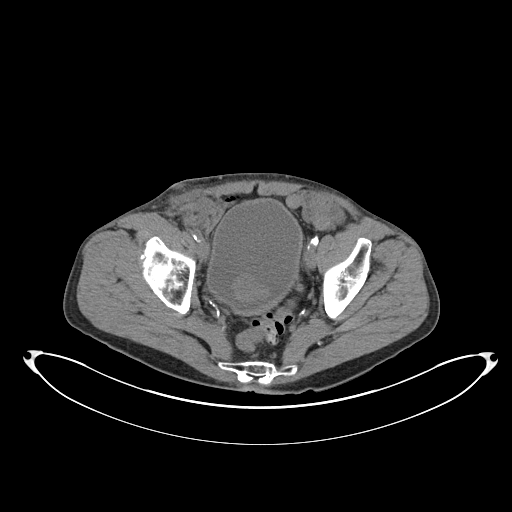
[im 64/115  soft-tissue]
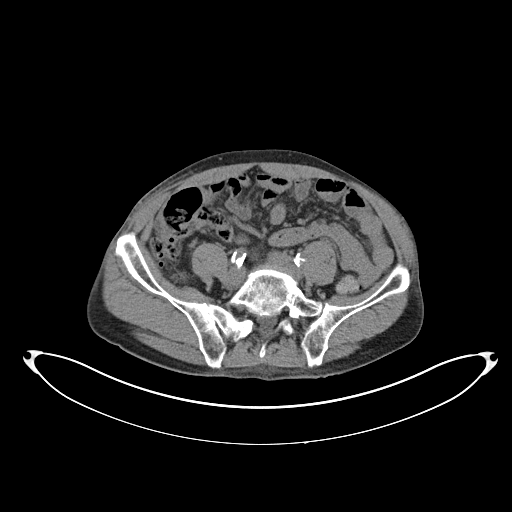
[im 70/115  soft-tissue]
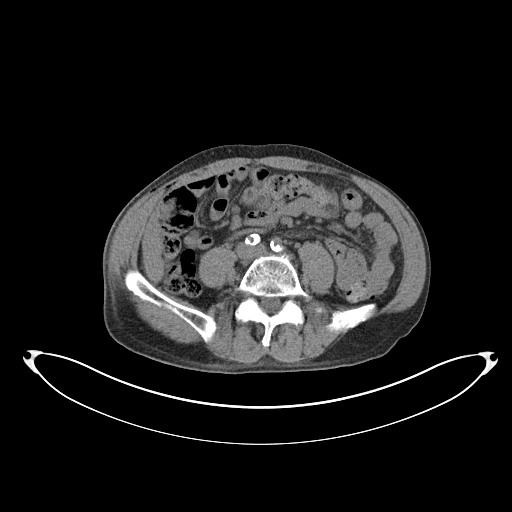
[im 83/115  soft-tissue]
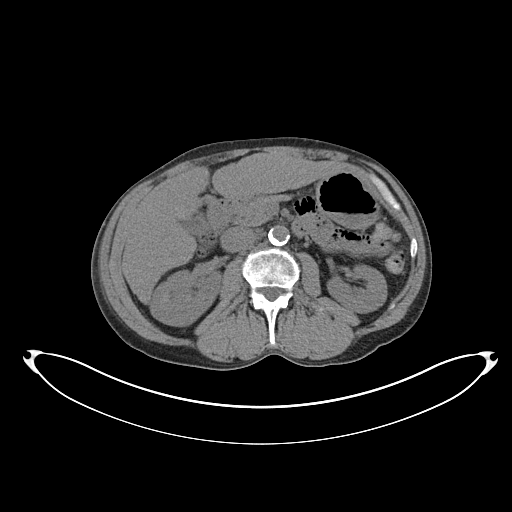
[im 83/115  bone]
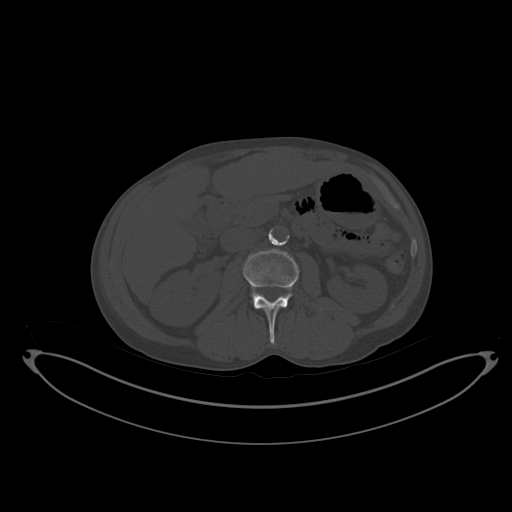
[im 89/115  soft-tissue]
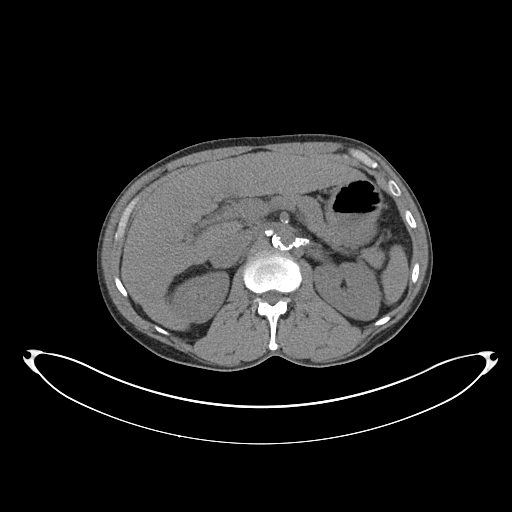
[im 96/115  soft-tissue]
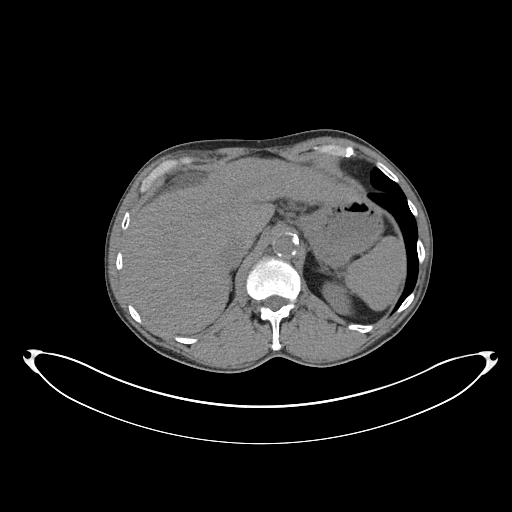
[im 108/115  soft-tissue]
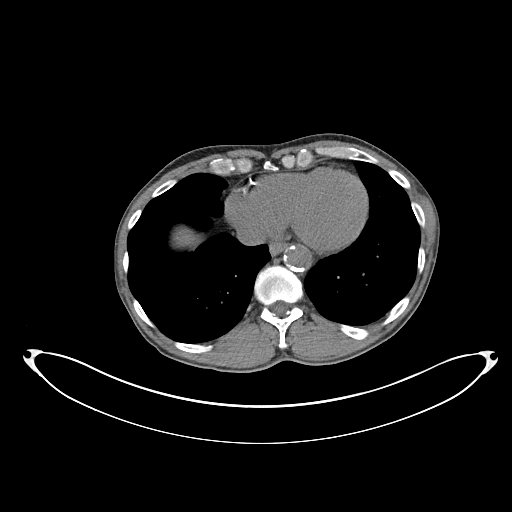

[Series 4: coronal st · coronal · 0.72mm/px · 3 of 127 slices shown]
[im 43/127  soft-tissue]
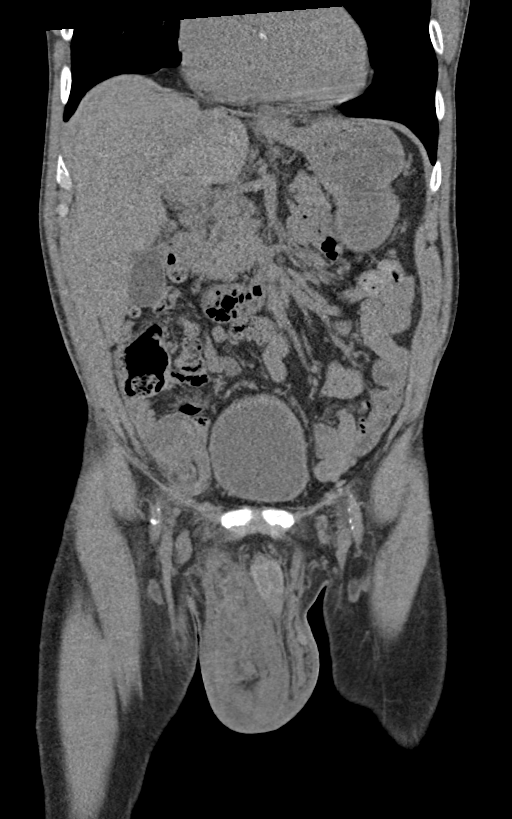
[im 57/127  soft-tissue]
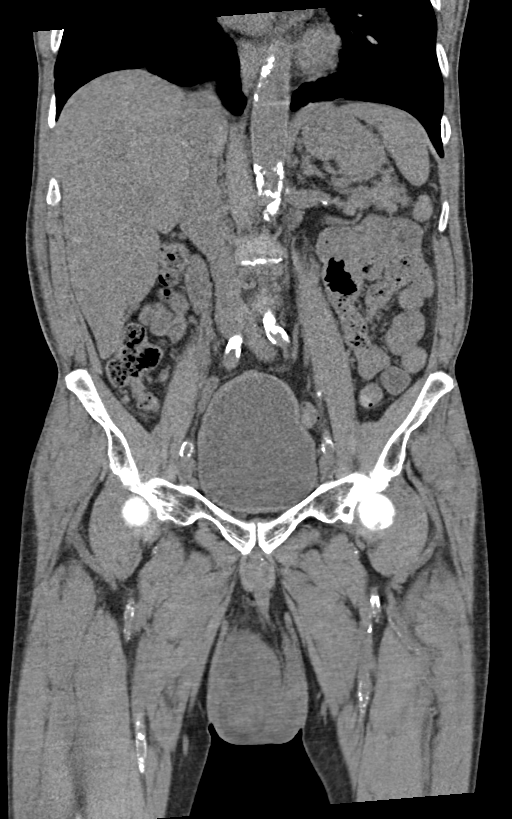
[im 71/127  soft-tissue]
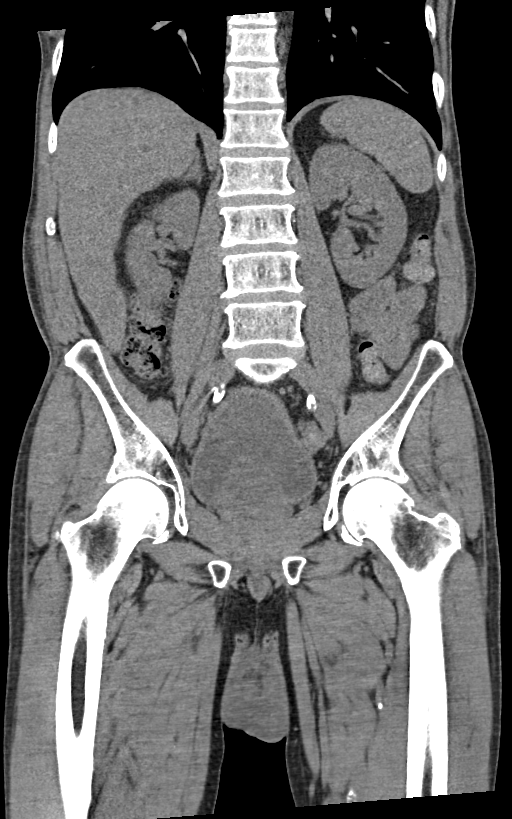

[15 of 46 positions shown; findings below may reference images not displayed]

FINDINGS: Lower chest: Lung bases are clear.

Hepatobiliary: No focal hepatic lesion. No biliary duct dilatation.
Common bile duct is normal.

Pancreas: Pancreas is normal. No ductal dilatation. No pancreatic
inflammation.

Spleen: Normal spleen

Adrenals/urinary tract: Adrenal glands normal. No hydronephrosis.
Ureters and bladder normal. Prostate gland indents the base the
bladder.

Stomach/Bowel: Stomach, small bowel appendix and cecum normal. No
evidence of bowel obstruction.

Vascular/Lymphatic: Abdominal aorta is normal caliber with
atherosclerotic calcification. There is no retroperitoneal or
periportal lymphadenopathy. No pelvic lymphadenopathy.

Reproductive: Prostate gland is enlarged and dense the base the
bladder.

Other: Interval RIGHT inguinal hernia repair. There is relatively
low density fluid along the inguinal canal which extends into the
RIGHT hemiscrotum. There is a postsurgical collection on the
peritoneal side of the hernia repair measuring 4.2 x 3.4 cm (image
62/2).

The RIGHT testicle itself appears normal size (4.3 x 2.8 by 2.7 cm)
with high-density. LEFT testicle difficult to identify

Musculoskeletal: No aggressive osseous lesion.
IMPRESSION: 1. Post RIGHT inguinal hernia repair. There is relatively
low-density fluid along the inguinal canal from the peritoneal space
into the RIGHT hemiscrotum. There is a rounded fluid collection on
the peritoneal side is of the repair which is also relatively
low-density.
2. RIGHT testicle is high-density but normal volume.
3. No evidence of bowel obstruction

## 2022-10-04 ENCOUNTER — Encounter (HOSPITAL_COMMUNITY): Payer: Self-pay

## 2022-10-04 ENCOUNTER — Emergency Department (HOSPITAL_COMMUNITY): Payer: Medicaid Other

## 2022-10-04 ENCOUNTER — Emergency Department (HOSPITAL_COMMUNITY)
Admission: EM | Admit: 2022-10-04 | Discharge: 2022-10-05 | Payer: Medicaid Other | Attending: Emergency Medicine | Admitting: Emergency Medicine

## 2022-10-04 ENCOUNTER — Other Ambulatory Visit: Payer: Self-pay

## 2022-10-04 DIAGNOSIS — W19XXXA Unspecified fall, initial encounter: Secondary | ICD-10-CM | POA: Diagnosis not present

## 2022-10-04 DIAGNOSIS — W16312A Fall into other water striking water surface causing other injury, initial encounter: Secondary | ICD-10-CM | POA: Diagnosis not present

## 2022-10-04 DIAGNOSIS — M5023 Other cervical disc displacement, cervicothoracic region: Secondary | ICD-10-CM | POA: Diagnosis not present

## 2022-10-04 DIAGNOSIS — M5021 Other cervical disc displacement,  high cervical region: Secondary | ICD-10-CM | POA: Diagnosis not present

## 2022-10-04 DIAGNOSIS — S12300A Unspecified displaced fracture of fourth cervical vertebra, initial encounter for closed fracture: Secondary | ICD-10-CM | POA: Diagnosis not present

## 2022-10-04 DIAGNOSIS — M50221 Other cervical disc displacement at C4-C5 level: Secondary | ICD-10-CM | POA: Diagnosis not present

## 2022-10-04 DIAGNOSIS — Z23 Encounter for immunization: Secondary | ICD-10-CM | POA: Diagnosis not present

## 2022-10-04 DIAGNOSIS — S0990XA Unspecified injury of head, initial encounter: Secondary | ICD-10-CM | POA: Diagnosis not present

## 2022-10-04 DIAGNOSIS — M542 Cervicalgia: Secondary | ICD-10-CM | POA: Diagnosis present

## 2022-10-04 DIAGNOSIS — S0101XA Laceration without foreign body of scalp, initial encounter: Secondary | ICD-10-CM | POA: Insufficient documentation

## 2022-10-04 DIAGNOSIS — I10 Essential (primary) hypertension: Secondary | ICD-10-CM | POA: Diagnosis not present

## 2022-10-04 LAB — CBC
HCT: 37.8 % — ABNORMAL LOW (ref 39.0–52.0)
Hemoglobin: 12 g/dL — ABNORMAL LOW (ref 13.0–17.0)
MCH: 28.2 pg (ref 26.0–34.0)
MCHC: 31.7 g/dL (ref 30.0–36.0)
MCV: 88.9 fL (ref 80.0–100.0)
Platelets: 99 10*3/uL — ABNORMAL LOW (ref 150–400)
RBC: 4.25 MIL/uL (ref 4.22–5.81)
RDW: 13.8 % (ref 11.5–15.5)
WBC: 6.3 10*3/uL (ref 4.0–10.5)
nRBC: 0 % (ref 0.0–0.2)

## 2022-10-04 LAB — COMPREHENSIVE METABOLIC PANEL
ALT: 40 U/L (ref 0–44)
AST: 45 U/L — ABNORMAL HIGH (ref 15–41)
Albumin: 3 g/dL — ABNORMAL LOW (ref 3.5–5.0)
Alkaline Phosphatase: 94 U/L (ref 38–126)
Anion gap: 9 (ref 5–15)
BUN: 18 mg/dL (ref 8–23)
CO2: 28 mmol/L (ref 22–32)
Calcium: 8.8 mg/dL — ABNORMAL LOW (ref 8.9–10.3)
Chloride: 103 mmol/L (ref 98–111)
Creatinine, Ser: 0.83 mg/dL (ref 0.61–1.24)
GFR, Estimated: >60 mL/min (ref >60)
Glucose, Bld: 140 mg/dL — ABNORMAL HIGH (ref 70–99)
Potassium: 3.3 mmol/L — ABNORMAL LOW (ref 3.5–5.1)
Sodium: 140 mmol/L (ref 135–145)
Total Bilirubin: 1 mg/dL (ref 0.3–1.2)
Total Protein: 7.3 g/dL (ref 6.5–8.1)

## 2022-10-04 LAB — PROTIME-INR
INR: 1.2 (ref 0.8–1.2)
Prothrombin Time: 15.1 s (ref 11.4–15.2)

## 2022-10-04 MED ORDER — ACETAMINOPHEN 500 MG PO TABS
1000.0000 mg | ORAL_TABLET | ORAL | Status: AC
  Administered 2022-10-04: 1000 mg via ORAL
  Filled 2022-10-04: qty 2

## 2022-10-04 MED ORDER — TETANUS-DIPHTH-ACELL PERTUSSIS 5-2.5-18.5 LF-MCG/0.5 IM SUSY
0.5000 mL | PREFILLED_SYRINGE | Freq: Once | INTRAMUSCULAR | Status: AC
  Administered 2022-10-04: 0.5 mL via INTRAMUSCULAR
  Filled 2022-10-04: qty 0.5

## 2022-10-04 MED ORDER — LIDOCAINE-EPINEPHRINE (PF) 2 %-1:200000 IJ SOLN
20.0000 mL | Freq: Once | INTRAMUSCULAR | Status: DC
  Filled 2022-10-04: qty 20

## 2022-10-04 NOTE — ED Provider Notes (Signed)
[place] EMERGENCY DEPARTMENT AT [facility] Provider Note   CSN: [id number] Arrival date & time: 10/04/22  1625     History  Chief Complaint  Patient presents with   Albert Salazar is a 67 y.o. male.  67 year old male with a history of liver cirrhosis, thrombocytopenia, and substance abuse who presents emergency department after a fall.  Patient reports from jail and was handcuffed on both of his ankles and reports that he tripped over them falling onto the floor and hitting his forehead.  Has a large laceration to his forehead.  No loss of consciousness.  Reports some mild neck pain.  No other injuries from the fall.  Not on blood thinners.       Home Medications Prior to Admission medications   Medication Sig Start Date End Date Taking? Authorizing Provider  amLODipine (NORVASC) 5 MG tablet Take 1 tablet (5 mg total) by mouth daily. 07/22/21   Pricilla Loveless, MD  ibuprofen (ADVIL) 200 MG tablet Take 200 mg by mouth daily as needed for mild pain.    [provider]  tamsulosin (FLOMAX) 0.4 MG CAPS capsule Take 1 capsule (0.4 mg total) by mouth daily. 06/20/21   Tilden Fossa, MD  traMADol (ULTRAM) 50 MG tablet Take 1 tablet (50 mg total) by mouth every 6 (six) hours as needed. Patient taking differently: Take 50 mg by mouth every 6 (six) hours as needed for moderate pain. 07/10/21   Lorre Nick, MD      Allergies    Patient has no known allergies.    Review of Systems   Review of Systems  Physical Exam Updated Vital Signs BP (!) 149/75   Pulse 77   Temp 98 F (36.7 C)   Resp 17   SpO2 98%  Physical Exam Constitutional:      General: He is not in acute distress.    Appearance: Normal appearance. He is not ill-appearing.  HENT:     Head: Normocephalic.     Comments: See image below for laceration    Right Ear: Tympanic membrane, ear canal and external ear normal.     Left Ear: Tympanic membrane, ear canal and external ear normal.      Mouth/Throat:     Mouth: Mucous membranes are moist.     Pharynx: Oropharynx is clear.  Eyes:     Extraocular Movements: Extraocular movements intact.     Conjunctiva/sclera: Conjunctivae normal.     Pupils: Pupils are equal, round, and reactive to light.  Neck:     Comments: No C-spine midline tenderness to palpation.  Paraspinal tenderness palpation Cardiovascular:     Rate and Rhythm: Normal rate and regular rhythm.     Pulses: Normal pulses.     Heart sounds: Normal heart sounds.  Pulmonary:     Effort: Pulmonary effort is normal. No respiratory distress.     Breath sounds: Normal breath sounds.  Abdominal:     General: Abdomen is flat.     Palpations: Abdomen is soft.     Tenderness: There is no abdominal tenderness. There is no guarding.  Musculoskeletal:        General: No deformity. Normal range of motion.     Cervical back: No rigidity or tenderness.     Comments: No tenderness to palpation of midline thoracic or lumbar spine.  No step-offs palpated.  No tenderness to palpation of chest wall.  No bruising noted.  No tenderness to palpation of bilateral  clavicles.  No tenderness to palpation, bruising, or deformities noted of bilateral shoulders, elbows, wrists, hips, knees, or ankles.  Neurological:     General: No focal deficit present.     Mental Status: He is alert and oriented to person, place, and time. Mental status is at baseline.     Cranial Nerves: No cranial nerve deficit.     Sensory: No sensory deficit.     Motor: No weakness.     ED Results / Procedures / Treatments   Labs (all labs ordered are listed, but only abnormal results are displayed) Labs Reviewed  CBC - Abnormal; Notable for the following components:      Result Value   Hemoglobin 12.0 (*)    HCT 37.8 (*)    Platelets 99 (*)    All other components within normal limits  COMPREHENSIVE METABOLIC PANEL - Abnormal; Notable for the following components:   Potassium 3.3 (*)    Glucose, Bld  140 (*)    Calcium 8.8 (*)    Albumin 3.0 (*)    AST 45 (*)    All other components within normal limits  PROTIME-INR    EKG None  Radiology MR Cervical Spine Wo Contrast  Result Date: 10/04/2022 CLINICAL DATA:  Initial evaluation for trauma. EXAM: MRI CERVICAL SPINE WITHOUT CONTRAST TECHNIQUE: Multiplanar, multisequence MR imaging of the cervical spine was performed. No intravenous contrast was administered. COMPARISON:  Prior study from earlier the same day. FINDINGS: Alignment: Examination moderately degraded by motion artifact. Straightening of the normal cervical lordosis. Trace degenerative retrolisthesis of C4 on C5, with trace anterolisthesis of C7 on T1. Vertebrae: Previously identified fracture extending through an osteophyte at the anterior aspect of the inferior endplate of C4 again seen, stable. No other visible acute fracture. Vertebral body height loss with abnormal edema involving the T4 and T5 vertebral bodies. Obliteration of the T4-5 interspace. Overall appearance suggestive of osteomyelitis discitis. Possible malignancy could also be considered. Vertebral body height otherwise maintained. Bone marrow signal intensity diffusely heterogeneous. No other visible worrisome osseous lesions. Mild marrow edema within the C5 and C6 vertebral bodies without endplate erosion, favored to be degenerative. Reactive marrow edema about the right C5-6 through C7-T1 facets, likely due to facet arthritis. Cord: Normal signal and morphology. No convincing cord signal changes allowing for motion. Posterior Fossa, vertebral arteries, paraspinal tissues: Patchy signal abnormality within the pons, most characteristic of chronic microvascular ischemic disease. Abnormal prevertebral edema seen extending from C2 through C7, likely posttraumatic in nature. Suspected injury/strain to the anterior longitudinal ligament at the level of the C4 fracture. Edema within the posterior paraspinous soft tissues, most  pronounced at C4-5, also suspicious for ligamentous injury. Posterior longitudinal ligament and ligamentum flavum appear grossly intact. Normal flow voids seen within the vertebral arteries bilaterally. Disc levels: C2-C3: Small central disc protrusion indents the ventral thecal sac. Mild bilateral uncovertebral spurring. Left-sided facet arthrosis. No spinal stenosis. Foramina remain patent. C3-C4: Degenerative intervertebral disc space narrowing with diffuse disc osteophyte complex. No spinal stenosis. Mild right C4 foraminal narrowing. Left neural foramina remains patent. C4-C5: Degenerative disc space narrowing with right eccentric disc osteophyte complex. Flattening and partial effacement of the ventral thecal sac. Mild cord flattening without cord signal changes. Moderate spinal stenosis. Severe right with moderate left C5 foraminal narrowing. C5-C6: Degenerative intervertebral disc space narrowing with diffuse disc osteophyte complex. Posterior component flattens and partially faces the ventral thecal sac. Superimposed right-sided facet hypertrophy. Resultant mild spinal stenosis. Moderate bilateral C6 foraminal narrowing.  C6-C7: Degenerative vertebral disc space narrowing with diffuse disc osteophyte complex. Mild facet hypertrophy. Mild spinal stenosis. Severe right with moderate left C7 foraminal narrowing. C7-T1: Degenerative disc space narrowing with mild disc bulge with uncovertebral spurring. Right greater left facet hypertrophy. No spinal stenosis. Mild left with moderate right C8 foraminal narrowing. IMPRESSION: 1. Motion degraded exam. 2. Acute fracture extending through an osteophyte at the anterior aspect of the inferior endplate of C4, stable. 3. Associated prevertebral edema extending from C2 through C7 with probable injury to the anterior longitudinal ligament at the level of the C4 fracture. Additional edema within the corresponding posterior soft tissues also suspicious for ligamentous  strain/injury. 4. Vertebral body height loss with abnormal edema involving the T4 and T5 vertebral bodies, with obliteration of the T4-5 interspace. Overall, appearance is suggestive of osteomyelitis discitis. Possible malignancy could also be considered. 5. Multilevel cervical spondylosis with resultant mild to moderate spinal stenosis at C4-5 through C6-7. Moderate to severe bilateral C5 through C8 foraminal narrowing as above. Electronically Signed   By: Rise Mu M.D.   On: 10/04/2022 23:41   DG Eye Foreign Body  Result Date: 10/04/2022 CLINICAL DATA:  Clearance for MRI EXAM: ORBITS FOR FOREIGN BODY - 2 VIEW COMPARISON:  None Available. FINDINGS: There is no evidence of metallic foreign body within the orbits. No significant bone abnormality identified. IMPRESSION: No evidence of metallic foreign body within the orbits. Electronically Signed   By: Charlett Nose M.D.   On: 10/04/2022 22:15   CT Head Wo Contrast  Result Date: 10/04/2022 CLINICAL DATA:  Head trauma, minor (Age >= 65y); head trauma 67 yo EXAM: CT HEAD WITHOUT CONTRAST CT CERVICAL SPINE WITHOUT CONTRAST TECHNIQUE: Multidetector CT imaging of the head and cervical spine was performed following the standard protocol without intravenous contrast. Multiplanar CT image reconstructions of the cervical spine were also generated. RADIATION DOSE REDUCTION: This exam was performed according to the departmental dose-optimization program which includes automated exposure control, adjustment of the mA and/or kV according to patient size and/or use of iterative reconstruction technique. COMPARISON:  CT C SPine 12/24/09 FINDINGS: CT HEAD FINDINGS Brain: No evidence of acute infarction, hemorrhage, hydrocephalus, extra-axial collection or mass lesion/mass effect. Vascular: No hyperdense vessel or unexpected calcification. Skull: Normal. Negative for fracture or focal lesion. Sinuses/Orbits: No middle ear or mastoid effusion. Mucosal thickening  bilateral maxillary sinuses. 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 Right lobe is unremarkable. Likely chronic nasal bone fractures. Other: None. CT CERVICAL SPINE FINDINGS Alignment: Trace retrolisthesis of C4 on C5. Trace anterolisthesis of C7 on T1. Skull base and vertebrae: There is an acute osteophyte fracture at the anterior inferior endplate of C4 Soft tissues and spinal canal: There is a small amount of hyperdense material within the ventral epidural space of the C4-C5 level (series 8, image 38). This is nonspecific and could represent disc material, but could also represent epidural blood products. Disc levels:  No CT evidence of high-grade spinal canal stenosis. Upper chest: Negative. Other: There is prevertebral soft tissue edema at the C4-C5 disc space level. IMPRESSION: 1. No acute intracranial abnormality. 2. Acute osteophyte fracture at the anterior inferior endplate of C4. There is also trace retrolisthesis of C4 on C5, which raises concern for ligamentous injury. Recommend further evaluation with a cervical spine MRI. 3. Small amount of hyperdense material in the ventral epidural space at the C4-C5 level. This is nonspecific and could represent disc material, but could also represent epidural blood products. Recommend further evaluation with MRI of  the cervical spine. Findings were discussed with Dr. Eloise Harman on 10/04/22 at 7:22 PM. Electronically Signed   By: Lorenza Cambridge M.D.   On: 10/04/2022 19:23   CT Cervical Spine Wo Contrast  Result Date: 10/04/2022 CLINICAL DATA:  Head trauma, minor (Age >= 65y); head trauma 67 yo EXAM: CT HEAD WITHOUT CONTRAST CT CERVICAL SPINE WITHOUT CONTRAST TECHNIQUE: Multidetector CT imaging of the head and cervical spine was performed following the standard protocol without intravenous contrast. Multiplanar CT image reconstructions of the cervical spine were also generated. RADIATION DOSE REDUCTION: This exam was performed according to the departmental dose-optimization  program which includes automated exposure control, adjustment of the mA and/or kV according to patient size and/or use of iterative reconstruction technique. COMPARISON:  CT C SPine 12/24/09 FINDINGS: CT HEAD FINDINGS Brain: No evidence of acute infarction, hemorrhage, hydrocephalus, extra-axial collection or mass lesion/mass effect. Vascular: No hyperdense vessel or unexpected calcification. Skull: Normal. Negative for fracture or focal lesion. Sinuses/Orbits: No middle ear or mastoid effusion. Mucosal thickening bilateral maxillary sinuses. 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 Right lobe is unremarkable. Likely chronic nasal bone fractures. Other: None. CT CERVICAL SPINE FINDINGS Alignment: Trace retrolisthesis of C4 on C5. Trace anterolisthesis of C7 on T1. Skull base and vertebrae: There is an acute osteophyte fracture at the anterior inferior endplate of C4 Soft tissues and spinal canal: There is a small amount of hyperdense material within the ventral epidural space of the C4-C5 level (series 8, image 38). This is nonspecific and could represent disc material, but could also represent epidural blood products. Disc levels:  No CT evidence of high-grade spinal canal stenosis. Upper chest: Negative. Other: There is prevertebral soft tissue edema at the C4-C5 disc space level. IMPRESSION: 1. No acute intracranial abnormality. 2. Acute osteophyte fracture at the anterior inferior endplate of C4. There is also trace retrolisthesis of C4 on C5, which raises concern for ligamentous injury. Recommend further evaluation with a cervical spine MRI. 3. Small amount of hyperdense material in the ventral epidural space at the C4-C5 level. This is nonspecific and could represent disc material, but could also represent epidural blood products. Recommend further evaluation with MRI of the cervical spine. Findings were discussed with Dr. Eloise Harman on 10/04/22 at 7:22 PM. Electronically Signed   By: Lorenza Cambridge M.D.   On: 10/04/2022 19:23     Procedures .Marland KitchenLaceration Repair  Date/Time: 10/04/2022 9:17 PM  Performed by: Rondel Baton, MD Authorized by: Rondel Baton, MD   Consent:    Consent obtained:  Verbal   Consent given by:  Patient   Risks discussed:  Infection, pain, retained foreign body, need for additional repair, poor cosmetic result and poor wound healing   Alternatives discussed:  No treatment Universal protocol:    Patient identity confirmed:  Verbally with patient Anesthesia:    Anesthesia method:  Local infiltration   Local anesthetic:  Lidocaine 2% WITH epi Laceration details:    Location:  Scalp   Scalp location:  L parietal   Length (cm):  13 Treatment:    Area cleansed with:  Soap and water and chlorhexidine   Irrigation solution:  Sterile water   Irrigation method:  Pressure wash Skin repair:    Repair method:  Sutures   Suture size:  5-0   Suture material:  Prolene   Suture technique:  Simple interrupted   Number of sutures:  8 Approximation:    Approximation:  Close Repair type:    Repair type:  Intermediate Post-procedure  details:    Dressing:  Sterile dressing   Procedure completion:  Tolerated well, no immediate complications     Medications Ordered in ED Medications  lidocaine-EPINEPHrine (XYLOCAINE W/EPI) 2 %-1:200000 (PF) injection 20 mL (has no administration in time range)  Tdap (BOOSTRIX) injection 0.5 mL (0.5 mLs Intramuscular Given 10/04/22 1809)  acetaminophen (TYLENOL) tablet 1,000 mg (1,000 mg Oral Given 10/04/22 2154)    ED Course/ Medical Decision Making/ A&P Clinical Course as of 10/04/22 2349  Thu Oct 04, 2022  1953 Dr Conchita Paris from neurosurgery consulted and has reviewed the cervical spine imaging.  Agrees with MRI at this time and recommends an Aspen collar with follow-up in 2 to 3 weeks if there is no evidence of ligamentous injury.  Recommends reaching back out to him if there are signs of ligamentous injury. [RP]  2329 Signed out to Dr Blinda Leatherwood. [RP]     Clinical Course User Index [RP] Rondel Baton, MD                                 Medical Decision Making Amount and/or Complexity of Data Reviewed Labs: ordered. Radiology: ordered.  Risk OTC drugs. Prescription drug management.   Thos Zinter is a 67 y.o. male with comorbidities that complicate the patient evaluation including with a history of liver cirrhosis and thrombocytopenia who presents to the emergency department after a fall with head trauma  Initial Ddx:  TBI, concussion, C-spine injury, laceration  MDM/Course:  Patient presents emergency department with a head injury.  Reports that he fell.  Is in prison so did do a thorough exam for any other signs of trauma in case this was not actually from a fall.  His exam did not reveal any other signs of trauma aside from the laceration to his head.  Still has intact sensation and strength in his extremities.  Laceration was repaired with nonabsorbable sutures which will need to be removed within 7 to 10 days.  CT of his head did not reveal acute intracranial abnormality but did reveal an avulsion fracture of his C4 vertebrae.  MRI was ordered and neurosurgery was consulted.  Upon re-evaluation patient remained stable.  Was signed out to the oncoming physician awaiting MRI results.  This patient presents to the ED for concern of complaints listed in HPI, this involves an extensive number of treatment options, and is a complaint that carries with it a high risk of complications and morbidity. Disposition including potential need for admission considered.   Dispo: Pending remainder of workup  Records reviewed Outpatient Clinic Notes The following labs were independently interpreted: Chemistry and show no acute abnormality I independently reviewed the following imaging with scope of interpretation limited to determining acute life threatening conditions related to emergency care: CT Head and agree with the radiologist  interpretation with the following exceptions: none I personally reviewed and interpreted cardiac monitoring: normal sinus rhythm  I personally reviewed and interpreted the pt's EKG: see above for interpretation  I have reviewed the patients home medications and made adjustments as needed Consults: Neurosurgery Social Determinants of health:  [facility] patient         Final Clinical Impression(s) / ED Diagnoses Final diagnoses:  Closed displaced fracture of fourth cervical vertebra, unspecified fracture morphology, initial encounter (HCC)  Fall, initial encounter  Injury of head, initial encounter  Laceration of scalp without foreign body, initial encounter    Rx / DC Orders  ED Discharge Orders     None         Rondel Baton, MD 10/04/22 [phone]

## 2022-10-04 NOTE — ED Triage Notes (Signed)
PT BIB EMS from the jail, slipped on some water and hit his head.  Y-shaped laceration to forehead, no LOC, no dizziness, or nausea presented at the time. Not on blood thinners.    136/72 Hr 80 16 Resp 96% RA  CBG 125

## 2022-10-04 NOTE — ED Notes (Signed)
C-collar applied to patient

## 2022-10-04 NOTE — Discharge Instructions (Addendum)
You were seen for your head and neck injury in the emergency department.  You are found to have a broken C4 vertebrae.  After leaving the emergency department, please continue to wear your c-collar at all times.  You may remove it to bathe.  Keep your stitches dry for the next 24 hours.  Do not submerge them in water until they are removed.  Please have them removed in 7 to 10 days.  Check your MyChart online for the results of any tests that had not resulted by the time you left the emergency department.   Follow-up with neurosurgery within the next 2 weeks about your spine fracture.  Return immediately to the emergency department if you experience any of the following: Severe headache, severe neck pain, numbness or weakness of your arms or legs, bowel or bladder incontinence, or any other concerning symptoms.    Thank you for visiting our Emergency Department. It was a pleasure taking care of you today.

## 2022-10-04 NOTE — ED Notes (Signed)
Patient in MRI at this time. 

## 2022-10-05 NOTE — ED Provider Notes (Signed)
Patient signed out to me by Dr. Eloise Harman.  Patient seen with neck injury and has C4 injury.  MRI was performed to evaluate ligaments.  MRI has been performed and interpreted.  I did discuss the MRI findings with Dr. Conchita Paris, on-call for neurosurgery.  He has visualized the MRI and feels that the patient does not require surgery.  Recommends continue with cervical collar, follow-up in office in 2 weeks.  Repeat examination, patient in no distress, no neurologic findings.   Gilda Crease, MD 10/05/22 [phone]

## 2022-11-09 ENCOUNTER — Other Ambulatory Visit (HOSPITAL_COMMUNITY): Payer: Self-pay | Admitting: Internal Medicine

## 2022-11-09 ENCOUNTER — Other Ambulatory Visit: Payer: Self-pay | Admitting: Family

## 2022-11-09 DIAGNOSIS — N1370 Vesicoureteral-reflux, unspecified: Secondary | ICD-10-CM

## 2022-11-16 ENCOUNTER — Ambulatory Visit (HOSPITAL_COMMUNITY)
Admission: RE | Admit: 2022-11-16 | Discharge: 2022-11-16 | Disposition: A | Discharge status: Home / Self Care | Source: Ambulatory Visit | Attending: Internal Medicine

## 2022-11-16 DIAGNOSIS — N400 Enlarged prostate without lower urinary tract symptoms: Secondary | ICD-10-CM | POA: Diagnosis not present

## 2022-11-16 DIAGNOSIS — K7460 Unspecified cirrhosis of liver: Secondary | ICD-10-CM | POA: Diagnosis not present

## 2022-11-16 DIAGNOSIS — N1370 Vesicoureteral-reflux, unspecified: Secondary | ICD-10-CM | POA: Diagnosis present

## 2022-11-16 DIAGNOSIS — R160 Hepatomegaly, not elsewhere classified: Secondary | ICD-10-CM | POA: Diagnosis not present

## 2022-11-16 MED ORDER — IOHEXOL 350 MG/ML SOLN
125.0000 mL | Freq: Once | INTRAVENOUS | Status: AC | PRN
  Administered 2022-11-16: 125 mL via INTRAVENOUS

## 2022-12-21 ENCOUNTER — Other Ambulatory Visit (HOSPITAL_COMMUNITY): Payer: Self-pay | Admitting: Family

## 2022-12-21 DIAGNOSIS — R947 Abnormal results of other endocrine function studies: Secondary | ICD-10-CM

## 2023-02-08 ENCOUNTER — Ambulatory Visit (HOSPITAL_COMMUNITY): Attending: Family

## 2023-02-08 ENCOUNTER — Encounter (HOSPITAL_COMMUNITY): Payer: Self-pay

## 2023-03-13 ENCOUNTER — Ambulatory Visit (HOSPITAL_COMMUNITY)
Admission: RE | Admit: 2023-03-13 | Discharge: 2023-03-13 | Disposition: A | Discharge status: Home / Self Care | Source: Ambulatory Visit | Attending: Family | Admitting: Family

## 2023-03-13 DIAGNOSIS — R947 Abnormal results of other endocrine function studies: Secondary | ICD-10-CM | POA: Insufficient documentation

## 2023-03-13 MED ORDER — GADOBUTROL 1 MMOL/ML IV SOLN
6.0000 mL | Freq: Once | INTRAVENOUS | Status: AC | PRN
  Administered 2023-03-13: 6 mL via INTRAVENOUS

## 2023-07-30 ENCOUNTER — Telehealth: Payer: Self-pay

## 2023-07-30 ENCOUNTER — Ambulatory Visit: Admitting: Diagnostic Neuroimaging

## 2023-07-30 DIAGNOSIS — S12490A Other displaced fracture of fifth cervical vertebra, initial encounter for closed fracture: Secondary | ICD-10-CM

## 2023-07-30 NOTE — Progress Notes (Signed)
 Complex Care Management Note Care Guide Note  07/30/2023 Name: Albert Salazar MRN: [id number] DOB: [birth year]   Complex Care Management Outreach Attempts: An unsuccessful telephone outreach was attempted today to offer the patient information about available complex care management services.  Follow Up Plan:  No further outreach attempts will be made at this time. We have been unable to contact the patient to offer or enroll patient in complex care management services.  Encounter Outcome:  No Answer  Leotis Rase [facility] Guide  Direct Dial: [phone]  Fax [phone]

## 2023-08-20 ENCOUNTER — Other Ambulatory Visit (INDEPENDENT_AMBULATORY_CARE_PROVIDER_SITE_OTHER): Payer: Self-pay

## 2023-08-20 ENCOUNTER — Ambulatory Visit (INDEPENDENT_AMBULATORY_CARE_PROVIDER_SITE_OTHER): Admitting: Diagnostic Neuroimaging

## 2023-08-20 ENCOUNTER — Encounter: Payer: Self-pay | Admitting: Diagnostic Neuroimaging

## 2023-08-20 VITALS — BP 147/80 | HR 89 | Wt 147.0 lb

## 2023-08-20 DIAGNOSIS — E237 Disorder of pituitary gland, unspecified: Secondary | ICD-10-CM | POA: Diagnosis not present

## 2023-08-20 DIAGNOSIS — D329 Benign neoplasm of meninges, unspecified: Secondary | ICD-10-CM | POA: Diagnosis not present

## 2023-08-20 DIAGNOSIS — Z0289 Encounter for other administrative examinations: Secondary | ICD-10-CM

## 2023-08-20 DIAGNOSIS — K7460 Unspecified cirrhosis of liver: Secondary | ICD-10-CM | POA: Diagnosis not present

## 2023-08-20 DIAGNOSIS — F1011 Alcohol abuse, in remission: Secondary | ICD-10-CM

## 2023-08-20 DIAGNOSIS — F1411 Cocaine abuse, in remission: Secondary | ICD-10-CM | POA: Diagnosis not present

## 2023-08-20 DIAGNOSIS — R413 Other amnesia: Secondary | ICD-10-CM

## 2023-08-20 NOTE — Patient Instructions (Signed)
 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

## 2023-08-20 NOTE — Progress Notes (Signed)
 [facility]  PATIENT: Albert Salazar DOB: 12-22-1955  REFERRING CLINICIAN: Nicholaus Falling, FNP HISTORY FROM: patient  REASON FOR VISIT: new consult   HISTORICAL  CHIEF COMPLAINT:  Chief Complaint  Patient presents with   Memory Loss    Rm 6 with [facility].  Pt is well, reports he is having short term memory concerns. He forgets what he was told to him recently. He is also repetitive asking the same questions.     HISTORY OF PRESENT ILLNESS:   68 year old male here for evaluation of memory and cognitive difficulty.  Patient referred here from [facility], where he has been incarcerated for past 1 year.  Patient does note some mild memory and cognitive symptoms.  He is not exactly sure why he was referred here.  He is accompanied by Camera operator.  Review of prior epic and hospital records indicate he has history of hepatitis C, alcohol abuse, cocaine abuse, cirrhosis of the liver.  He was living on his own prior to being incarcerated.  Unfortunately no other collateral history available.   REVIEW OF SYSTEMS: Full 14 system review of systems performed and negative with exception of: as per hPI/  ALLERGIES: No Known Allergies  HOME MEDICATIONS: Outpatient Medications Prior to Visit  Medication Sig Dispense Refill   amLODipine  (NORVASC ) 10 MG tablet Take 10 mg by mouth daily.     atorvastatin (LIPITOR) 20 MG tablet Take 20 mg by mouth daily.     Cholecalciferol (VITAMIN D3) 25 MCG (1000 UT) CAPS Take by mouth.     lisinopril (ZESTRIL) 2.5 MG tablet Take 2.5 mg by mouth daily.     magnesium  oxide (MAG-OX) 400 (240 Mg) MG tablet Take 400 mg by mouth daily.     metFORMIN (GLUCOPHAGE-XR) 500 MG 24 hr tablet Take 500 mg by mouth daily with breakfast.     mirtazapine (REMERON) 15 MG tablet Take 15 mg by mouth at bedtime.     sertraline (ZOLOFT) 100 MG tablet Take 200 mg by mouth daily.     amLODipine  (NORVASC ) 5 MG tablet  Take 1 tablet (5 mg total) by mouth daily. (Patient not taking: Reported on 08/20/2023) 30 tablet 1   ibuprofen  (ADVIL ) 200 MG tablet Take 200 mg by mouth daily as needed for mild pain. (Patient not taking: Reported on 08/20/2023)     tamsulosin  (FLOMAX ) 0.4 MG CAPS capsule Take 1 capsule (0.4 mg total) by mouth daily. (Patient not taking: Reported on 08/20/2023) 30 capsule 0   traMADol  (ULTRAM ) 50 MG tablet Take 1 tablet (50 mg total) by mouth every 6 (six) hours as needed. (Patient not taking: Reported on 08/20/2023) 15 tablet 0   No facility-administered medications prior to visit.    PAST MEDICAL HISTORY: Past Medical History:  Diagnosis Date   Bipolar disorder (HCC)    Blindness of left eye    Cirrhosis of liver (HCC)    Depression    Hepatitis C    Polysubstance abuse (HCC)    Thrombocytopenia (HCC)     PAST SURGICAL HISTORY: Past Surgical History:  Procedure Laterality Date   EYE SURGERY     INGUINAL HERNIA REPAIR N/A 07/04/2021   Procedure: OPEN RIGHT INGUINAL HERNIA REPAIR WITH MESH;  Surgeon: Signe Mitzie LABOR, MD;  Location: [facility];  Service: General;  Laterality: N/A;    FAMILY HISTORY: Family History  Problem Relation Age of Onset   Diabetes Brother     SOCIAL HISTORY: Social History  Socioeconomic History   Marital status: Single    Spouse name: Not on file   Number of children: Not on file   Years of education: Not on file   Highest education level: Not on file  Occupational History   Not on file  Tobacco Use   Smoking status: Every Day    Current packs/day: 0.50    Average packs/day: 0.5 packs/day for 40.0 years (20.0 ttl pk-yrs)    Types: Cigarettes   Smokeless tobacco: Never  Vaping Use   Vaping status: Never Used  Substance and Sexual Activity   Alcohol use: Yes    Alcohol/week: 8.0 standard drinks of alcohol    Types: 8 Cans of beer per week   Drug use: Not Currently    Types: Cocaine   Sexual activity: Not on file  Other Topics Concern    Not on file  Social History Narrative   Not on file   Social Drivers of Health   Financial Resource Strain: Not on file  Food Insecurity: Not on file  Transportation Needs: Not on file  Physical Activity: Not on file  Stress: Not on file  Social Connections: Not on file  Intimate Partner Violence: Not on file     PHYSICAL EXAM  GENERAL EXAM/CONSTITUTIONAL: Vitals:  Vitals:   08/20/23 0906  BP: (!) 147/80  Pulse: 89  Weight: 147 lb (66.7 kg)   Body mass index is 21.71 kg/m. Wt Readings from Last 3 Encounters:  08/20/23 147 lb (66.7 kg)  07/10/21 140 lb (63.5 kg)  07/04/21 140 lb 12.8 oz (63.9 kg)   Patient is in no distress; IN RESTRAINTS (HANDCUFFS); UNKEMPT; TOES NAILS LONG AND NOT CUT  CARDIOVASCULAR: Examination of carotid arteries is normal; no carotid bruits Regular rate and rhythm, no murmurs Examination of peripheral vascular system by observation and palpation is normal  EYES: Ophthalmoscopic exam of optic discs and posterior segments is normal; no papilledema or hemorrhages No results found.  MUSCULOSKELETAL: Gait, strength, tone, movements noted in Neurologic exam below  NEUROLOGIC: MENTAL STATUS:     08/20/2023    9:09 AM  MMSE - Mini Mental State Exam  Orientation to time 1  Orientation to Place 4  Registration 3  Attention/ Calculation 2  Recall 0  Language- name 2 objects 2  Language- repeat 0  Language- follow 3 step command 0  Language- read & follow direction 1  Write a sentence 0  Copy design 0  Total score 13   awake, alert, oriented to person, place and time; EXCEPT NOT SEASON OR BUILDING DECR RECALL DECR attention and concentration language fluent, comprehension intact, naming intact fund of knowledge appropriate  CRANIAL NERVE:  2nd, 3rd, 4th, 6th - RIGHT PUPIL , MIN REACTION; LEFT EYE PHTHISIS BULBI; RIGHT EYE VISION INTACT; extraocular muscles intact, no nystagmus 5th - facial sensation symmetric 7th - facial  strength symmetric 8th - hearing intact 9th - palate elevates symmetrically, uvula midline 11th - shoulder shrug symmetric 12th - tongue protrusion midline  MOTOR:  normal bulk and tone, full strength in the BUE, BLE; LIMITED AS PATIENT IS IN RESTRAINTS  SENSORY:  normal and symmetric to light touch  COORDINATION:  finger-nose-finger, fine finger movements LIMITED DUE TO RESTRAINTS  REFLEXES:  deep tendon reflexes 1+ and symmetric POSITIVE SNOUT AND MYERSON'S REFLEXES; POSITIVE PALMOMENTAL REFLEXES  GAIT/STATION:  narrow based gait     DIAGNOSTIC DATA (LABS, IMAGING, TESTING) - I reviewed patient records, labs, notes, testing and imaging myself where available.  Lab Results  Component Value Date   WBC 6.3 10/04/2022   HGB 12.0 (L) 10/04/2022   HCT 37.8 (L) 10/04/2022   MCV 88.9 10/04/2022   PLT 99 (L) 10/04/2022      Component Value Date/Time   NA 140 10/04/2022 1936   K 3.3 (L) 10/04/2022 1936   CL 103 10/04/2022 1936   CO2 28 10/04/2022 1936   GLUCOSE 140 (H) 10/04/2022 1936   BUN 18 10/04/2022 1936   CREATININE 0.83 10/04/2022 1936   CREATININE 0.82 04/21/2014 1037   CALCIUM 8.8 (L) 10/04/2022 1936   PROT 7.3 10/04/2022 1936   ALBUMIN  3.0 (L) 10/04/2022 1936   AST 45 (H) 10/04/2022 1936   ALT 40 10/04/2022 1936   ALKPHOS 94 10/04/2022 1936   BILITOT 1.0 10/04/2022 1936   GFRNONAA >60 10/04/2022 1936   GFRNONAA >89 04/21/2014 1037   GFRAA >60 05/01/2019 1433   GFRAA >89 04/21/2014 1037   No results found for: CHOL, HDL, LDLCALC, LDLDIRECT, TRIG, CHOLHDL Lab Results  Component Value Date   HGBA1C 5.7 (H) 04/21/2014   No results found for: VITAMINB12 No results found for: TSH  03/13/23 MRI brain [I reviewed images myself and agree with interpretation. Progressive atrophy and chronic small vessel ischemic disease since 2011 CT. -[facility]]  1. No acute finding. Advanced chronic small-vessel ischemic changes of the pons and cerebral  hemispheric deep white matter. 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 2. 15 mm diameter left frontal convexity meningioma with a maximal thickness of 1 cm. No significant mass-effect upon the brain. 3. 5 x 7 mm hypoenhancing focus within the posterior pituitary gland which could be a pituitary adenoma or a pars intermedia/Rathke's cleft cyst. See above discussion.    ASSESSMENT AND PLAN  68 y.o. year old male here with:   Dx:  1. Memory loss   2. History of alcohol abuse   3. History of cocaine abuse (HCC)   4. Hepatic cirrhosis, unspecified hepatic cirrhosis type, unspecified whether ascites present (HCC)   5. Pituitary lesion (HCC)   6. 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  I reviewed images, labs, notes, records myself. I summarized findings and reviewed with patient, high complexity evaluation.   EDUARD FABIENE HANLON, MD 08/20/2023, 10:09 AM Certified in Neurology, Neurophysiology and Neuroimaging  [facility] [address], [address] [place], [place] [address] [phone]

## 2023-08-21 LAB — TSH RFX ON ABNORMAL TO FREE T4: TSH: 1.59 u[IU]/mL (ref 0.450–4.500)

## 2023-08-23 LAB — COMPREHENSIVE METABOLIC PANEL WITH GFR
ALT: 29 IU/L (ref 0–44)
AST: 35 IU/L (ref 0–40)
Albumin: 3.5 g/dL — ABNORMAL LOW (ref 3.9–4.9)
Alkaline Phosphatase: 153 IU/L — ABNORMAL HIGH (ref 44–121)
BUN/Creatinine Ratio: 17 (ref 10–24)
BUN: 14 mg/dL (ref 8–27)
Bilirubin Total: 0.5 mg/dL (ref 0.0–1.2)
CO2: 22 mmol/L (ref 20–29)
Calcium: 9.1 mg/dL (ref 8.6–10.2)
Chloride: 106 mmol/L (ref 96–106)
Creatinine, Ser: 0.82 mg/dL (ref 0.76–1.27)
Globulin, Total: 4.2 g/dL (ref 1.5–4.5)
Glucose: 137 mg/dL — ABNORMAL HIGH (ref 70–99)
Potassium: 3.8 mmol/L (ref 3.5–5.2)
Sodium: 142 mmol/L (ref 134–144)
Total Protein: 7.7 g/dL (ref 6.0–8.5)
eGFR: 97 mL/min/1.73 (ref >59)

## 2023-08-23 LAB — ATN PROFILE
A -- Beta-amyloid 42/40 Ratio: 0.105 (ref >0.102)
Beta-amyloid 40: 151.49 pg/mL
Beta-amyloid 42: 15.89 pg/mL
N -- NfL, Plasma: 5.32 pg/mL — AB (ref 0.00–3.65)
T -- p-tau181: 0.87 pg/mL (ref 0.00–0.97)

## 2023-08-23 LAB — VITAMIN B1: Thiamine: 126.1 nmol/L (ref 66.5–200.0)

## 2023-08-23 LAB — VITAMIN B12: Vitamin B-12: 897 pg/mL (ref 232–1245)

## 2023-08-23 LAB — AMMONIA: Ammonia: 68 ug/dL (ref 40–200)

## 2023-09-05 ENCOUNTER — Ambulatory Visit: Payer: Self-pay | Admitting: Diagnostic Neuroimaging

## 2023-09-05 ENCOUNTER — Ambulatory Visit: Admitting: Podiatry

## 2023-10-01 ENCOUNTER — Ambulatory Visit: Admitting: Diagnostic Neuroimaging

## 2023-10-25 ENCOUNTER — Ambulatory Visit: Admitting: Podiatry

## 2023-10-25 ENCOUNTER — Encounter: Payer: Self-pay | Admitting: Podiatry

## 2023-10-25 DIAGNOSIS — E1149 Type 2 diabetes mellitus with other diabetic neurological complication: Secondary | ICD-10-CM | POA: Diagnosis not present

## 2023-10-25 DIAGNOSIS — B351 Tinea unguium: Secondary | ICD-10-CM | POA: Diagnosis not present

## 2023-10-25 DIAGNOSIS — E1140 Type 2 diabetes mellitus with diabetic neuropathy, unspecified: Secondary | ICD-10-CM | POA: Diagnosis not present

## 2023-10-25 DIAGNOSIS — M79674 Pain in right toe(s): Secondary | ICD-10-CM | POA: Diagnosis not present

## 2023-10-25 DIAGNOSIS — M79675 Pain in left toe(s): Secondary | ICD-10-CM

## 2023-10-25 NOTE — Progress Notes (Signed)
 Subjective:   Patient ID: Albert Salazar, male   DOB: 68 y.o.   MRN: [id number]   HPI Patient presents incarcerated in handcuffs with severe nail disease 1-5 both feet that are elongated thickened with patient who is also a diabetic and has dementia.  He does smoke a half a pack per day not active   Review of Systems  All other systems reviewed and are negative.       Objective:  Physical Exam Vitals and nursing note reviewed.  Constitutional:      Appearance: He is well-developed.  Pulmonary:     Effort: Pulmonary effort is normal.  Musculoskeletal:        General: Normal range of motion.  Skin:    General: Skin is warm.  Neurological:     Mental Status: He is alert.     Vascular status intact neurologically mild diminishment with patient noted to have severely thickened dystrophic nailbeds 1-5 both feet that are bothersome and very hard for him to wear shoe gear with.  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
